# Patient Record
Sex: Female | Born: 1980 | Race: White | Hispanic: No | Marital: Single | Smoking: Never smoker
Health system: Southern US, Community
[De-identification: ages and names within clinical notes are randomized; demographics above are authoritative.]

## PROBLEM LIST (undated history)

## (undated) DIAGNOSIS — F320 Major depressive disorder, single episode, mild: Secondary | ICD-10-CM

## (undated) DIAGNOSIS — K219 Gastro-esophageal reflux disease without esophagitis: Secondary | ICD-10-CM

## (undated) DIAGNOSIS — I10 Essential (primary) hypertension: Secondary | ICD-10-CM

## (undated) DIAGNOSIS — J189 Pneumonia, unspecified organism: Secondary | ICD-10-CM

## (undated) DIAGNOSIS — M1990 Unspecified osteoarthritis, unspecified site: Secondary | ICD-10-CM

## (undated) DIAGNOSIS — M069 Rheumatoid arthritis, unspecified: Secondary | ICD-10-CM

## (undated) DIAGNOSIS — G629 Polyneuropathy, unspecified: Secondary | ICD-10-CM

## (undated) HISTORY — PX: WISDOM TOOTH EXTRACTION: SHX21

## (undated) HISTORY — DX: Polyneuropathy, unspecified: G62.9

## (undated) HISTORY — DX: Unspecified osteoarthritis, unspecified site: M19.90

## (undated) HISTORY — DX: Major depressive disorder, single episode, mild: F32.0

## (undated) HISTORY — DX: Essential (primary) hypertension: I10

---

## 1999-01-12 ENCOUNTER — Other Ambulatory Visit: Admission: RE | Admit: 1999-01-12 | Discharge: 1999-01-12 | Payer: Self-pay | Admitting: Obstetrics and Gynecology

## 1999-02-13 ENCOUNTER — Other Ambulatory Visit: Admission: RE | Admit: 1999-02-13 | Discharge: 1999-02-13 | Payer: Self-pay | Admitting: Obstetrics and Gynecology

## 2000-10-16 ENCOUNTER — Other Ambulatory Visit: Admission: RE | Admit: 2000-10-16 | Discharge: 2000-10-16 | Payer: Self-pay | Admitting: Obstetrics and Gynecology

## 2001-10-06 ENCOUNTER — Other Ambulatory Visit: Admission: RE | Admit: 2001-10-06 | Discharge: 2001-10-06 | Payer: Self-pay | Admitting: Obstetrics and Gynecology

## 2002-11-09 ENCOUNTER — Other Ambulatory Visit: Admission: RE | Admit: 2002-11-09 | Discharge: 2002-11-09 | Payer: Self-pay | Admitting: Obstetrics and Gynecology

## 2003-12-29 ENCOUNTER — Other Ambulatory Visit: Admission: RE | Admit: 2003-12-29 | Discharge: 2003-12-29 | Payer: Self-pay | Admitting: Obstetrics and Gynecology

## 2004-06-14 ENCOUNTER — Other Ambulatory Visit: Admission: RE | Admit: 2004-06-14 | Discharge: 2004-06-14 | Payer: Self-pay | Admitting: Obstetrics and Gynecology

## 2005-04-02 ENCOUNTER — Other Ambulatory Visit: Admission: RE | Admit: 2005-04-02 | Discharge: 2005-04-02 | Payer: Self-pay | Admitting: Obstetrics and Gynecology

## 2010-04-23 ENCOUNTER — Emergency Department: Payer: Self-pay | Admitting: Internal Medicine

## 2010-05-08 ENCOUNTER — Ambulatory Visit: Payer: Self-pay | Admitting: Rheumatology

## 2010-12-19 ENCOUNTER — Encounter: Payer: Self-pay | Admitting: Rheumatology

## 2010-12-31 ENCOUNTER — Encounter: Payer: Self-pay | Admitting: Rheumatology

## 2012-10-23 ENCOUNTER — Emergency Department: Payer: Self-pay | Admitting: Emergency Medicine

## 2012-10-23 LAB — CBC
HCT: 39.2 % (ref 35.0–47.0)
MCH: 32.2 pg (ref 26.0–34.0)
RDW: 12.6 % (ref 11.5–14.5)
WBC: 8.8 10*3/uL (ref 3.6–11.0)

## 2012-10-23 LAB — COMPREHENSIVE METABOLIC PANEL
Albumin: 4 g/dL (ref 3.4–5.0)
Alkaline Phosphatase: 86 U/L (ref 50–136)
Anion Gap: 10 (ref 7–16)
BUN: 14 mg/dL (ref 7–18)
Calcium, Total: 8.8 mg/dL (ref 8.5–10.1)
Co2: 22 mmol/L (ref 21–32)
Glucose: 127 mg/dL — ABNORMAL HIGH (ref 65–99)
SGOT(AST): 71 U/L — ABNORMAL HIGH (ref 15–37)
SGPT (ALT): 99 U/L — ABNORMAL HIGH (ref 12–78)
Total Protein: 7.4 g/dL (ref 6.4–8.2)

## 2013-01-27 ENCOUNTER — Encounter: Payer: Self-pay | Admitting: Family Medicine

## 2013-01-27 ENCOUNTER — Ambulatory Visit (INDEPENDENT_AMBULATORY_CARE_PROVIDER_SITE_OTHER): Payer: Managed Care, Other (non HMO) | Admitting: Family Medicine

## 2013-01-27 VITALS — BP 130/80 | HR 89 | Temp 97.9°F | Ht 65.5 in | Wt 221.2 lb

## 2013-01-27 DIAGNOSIS — Z8742 Personal history of other diseases of the female genital tract: Secondary | ICD-10-CM

## 2013-01-27 DIAGNOSIS — E785 Hyperlipidemia, unspecified: Secondary | ICD-10-CM | POA: Insufficient documentation

## 2013-01-27 DIAGNOSIS — M0230 Reiter's disease, unspecified site: Secondary | ICD-10-CM | POA: Insufficient documentation

## 2013-01-27 DIAGNOSIS — I10 Essential (primary) hypertension: Secondary | ICD-10-CM | POA: Insufficient documentation

## 2013-01-27 DIAGNOSIS — G43009 Migraine without aura, not intractable, without status migrainosus: Secondary | ICD-10-CM | POA: Insufficient documentation

## 2013-01-27 DIAGNOSIS — Z87898 Personal history of other specified conditions: Secondary | ICD-10-CM | POA: Insufficient documentation

## 2013-01-27 HISTORY — DX: Reiter's disease, unspecified site: M02.30

## 2013-01-27 MED ORDER — LOSARTAN POTASSIUM-HCTZ 50-12.5 MG PO TABS: 1.0000 | ORAL_TABLET | Freq: Every day | ORAL | Status: DC

## 2013-01-27 NOTE — Assessment & Plan Note (Signed)
Change to losartan HCTZ. Get UA, EKG and labs from other MDs.  No clear sign of end organ damage.  Risk factor modification discussed. Encouraged exercise, weight loss, healthy eating habits.

## 2013-01-27 NOTE — Patient Instructions (Addendum)
If interested, return for Tetanus booster with nurse visit. Have Dr. [name] send upcoming labs to our office to review. Work on [employer], weight loss and exercise. Change the HCTZ to losartan HCTZ. Use contraception. If not checking kidney function at next Rheum lab test... Return for kidney tests in 7-10 days. Follow BP at home OV. Follow up in 1 month... Bring BP measurements to that OV.

## 2013-01-27 NOTE — Progress Notes (Signed)
  Subjective:    Patient ID: Phyllis Myers, female    DOB: [birth year], 32 y.o.   MRN: [id number]  HPI  32 year old female presents to establish.  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 [name]... On enbrel, methotrexate. Use indocin and hydrocodone prn.   In past few months she has been having headhaches and elevated BP measurements.  Went to ER 11/2011... Heart racing, presyncope..blood pressure was high.  EKG and blood work was nml.  Started on HCTZ at that time. GYN has been following for her but he wants her to establish with a PCP to manage BP.   Also 1 week ago felt ill... 150/110 on 4/19. At home 117-123/93-99 HR 78-105  Last CPE and pap in 08/2012... Labs done at that time were nml.  Looking into IUD.Marland Kitchen stopped OCPs,  given elevated BP.  Has upcoming blood work in next week with Rheum.   Diet: moderate. Exercise: None Review of Systems  Constitutional: Negative for fever, fatigue and unexpected weight change.  HENT: Negative for ear pain, congestion, sore throat, sneezing, trouble swallowing and sinus pressure.   Eyes: Negative for pain and itching.  Respiratory: Negative for cough, shortness of breath and wheezing.   Cardiovascular: Positive for palpitations. Negative for chest pain and leg swelling.  Gastrointestinal: Negative for nausea, abdominal pain, diarrhea, constipation and blood in stool.  Genitourinary: Negative for dysuria, hematuria, vaginal discharge, difficulty urinating and menstrual problem.  Skin: Negative for rash.  Neurological: Negative for syncope, weakness, light-headedness, numbness and headaches.  Psychiatric/Behavioral: Negative for suicidal ideas, confusion and dysphoric mood. The patient is nervous/anxious.        Off and on worried about BP.       Objective:   Physical Exam  Constitutional: Vital signs are normal. She appears well-developed and well-nourished. She is cooperative.  Non-toxic appearance. She does not appear  ill. No distress.  overweight  HENT:  Head: Normocephalic.  Right Ear: Hearing, tympanic membrane, external ear and ear canal normal.  Left Ear: Hearing, tympanic membrane, external ear and ear canal normal.  Nose: Nose normal.  Eyes: Conjunctivae, EOM and lids are normal. Pupils are equal, round, and reactive to light. No foreign bodies found.  Neck: Trachea normal and normal range of motion. Neck supple. Carotid bruit is not present. No mass and no thyromegaly present.  Cardiovascular: Normal rate, regular rhythm, S1 normal, S2 normal, normal heart sounds and intact distal pulses.  Exam reveals no gallop.   No murmur heard. Pulmonary/Chest: Effort normal and breath sounds normal. No respiratory distress. She has no wheezes. She has no rhonchi. She has no rales.  Abdominal: Soft. Normal appearance and bowel sounds are normal. She exhibits no distension, no fluid wave, no abdominal bruit and no mass. There is no hepatosplenomegaly. There is no tenderness. There is no rebound, no guarding and no CVA tenderness. No hernia.  Lymphadenopathy:    She has no cervical adenopathy.    She has no axillary adenopathy.  Neurological: She is alert. She has normal strength. No cranial nerve deficit or sensory deficit.  Skin: Skin is warm, dry and intact. No rash noted.  Psychiatric: Her speech is normal and behavior is normal. Judgment normal. Her mood appears not anxious. Cognition and memory are normal. She does not exhibit a depressed mood.          Assessment & Plan:

## 2013-02-11 ENCOUNTER — Telehealth: Payer: Self-pay

## 2013-02-11 NOTE — Telephone Encounter (Signed)
Pt left v/m; Dr Ermalene Searing started pt on BP med, pt was to request lab results from Dr Lavenia Atlas. Pt wanted to make sure Dr Ermalene Searing received lab results and after review was pt to continue taking BP med.Please advise.

## 2013-02-11 NOTE — Telephone Encounter (Signed)
I did not get labs from Dr. [name]... Can you please request these ( Mainly BMET) so I can assure pt that she can continue new BP med.

## 2013-02-12 NOTE — Telephone Encounter (Signed)
I will not be back in office for a while.. Can you let me know what her creatinine, Na and K were, please?

## 2013-02-12 NOTE — Telephone Encounter (Signed)
Labs requested from [place] and are on your desk for review.

## 2013-02-13 ENCOUNTER — Telehealth: Payer: Self-pay | Admitting: Family Medicine

## 2013-02-13 NOTE — Telephone Encounter (Signed)
Pt f/u to earlier call this week to see if there has been a decision on whether or not to stop her BP meds based on her labs. Please call pt back today per her request.

## 2013-02-13 NOTE — Telephone Encounter (Signed)
Potassium 3.8 Creatine 0.6  Sodium was not checked

## 2013-02-13 NOTE — Telephone Encounter (Signed)
Patient advised Dr. Ermalene Searing is not in the office today and will call her monday

## 2013-02-13 NOTE — Telephone Encounter (Signed)
Let her know that things look good and she can continue on her current  BP med.

## 2013-02-16 NOTE — Telephone Encounter (Signed)
Patient advised.

## 2013-02-24 ENCOUNTER — Encounter: Payer: Self-pay | Admitting: Family Medicine

## 2013-02-24 ENCOUNTER — Ambulatory Visit (INDEPENDENT_AMBULATORY_CARE_PROVIDER_SITE_OTHER): Payer: Managed Care, Other (non HMO) | Admitting: Family Medicine

## 2013-02-24 VITALS — BP 120/84 | HR 72 | Temp 98.0°F | Ht 65.5 in | Wt 228.5 lb

## 2013-02-24 DIAGNOSIS — E785 Hyperlipidemia, unspecified: Secondary | ICD-10-CM

## 2013-02-24 DIAGNOSIS — I10 Essential (primary) hypertension: Secondary | ICD-10-CM

## 2013-02-24 NOTE — Progress Notes (Signed)
  Subjective:    Patient ID: Phyllis Myers, female    DOB: 1980-11-06, 32 y.o.   MRN: [id number]  HPI  Hypertension:  Well controlled on losartan HCTZ. Creatinine stable on 5/5 per [facility] labs Using medication without problems or lightheadedness: None Chest pain with exertion:None Edema:None Short of breath:None, she occasionally feels like she cannot get a good breath. Average home BPs: 117/68-120s/70s. Other issues:  She has been exercising a lot. Diet: working on changes, but poor over [employer] day weekend.  Nonsmoker.  No further headaches or dizziness.   Wt Readings from Last 3 Encounters:  02/24/13 228 lb 8 oz (103.647 kg)  01/27/13 221 lb 4 oz (100.358 kg)      Review of Systems  Constitutional: Negative for fever and fatigue.  HENT: Negative for ear pain.   Eyes: Negative for pain.  Respiratory: Negative for chest tightness and shortness of breath.   Cardiovascular: Negative for chest pain, palpitations and leg swelling.  Gastrointestinal: Negative for abdominal pain.  Genitourinary: Negative for dysuria.       Objective:   Physical Exam  Constitutional: Vital signs are normal. She appears well-developed and well-nourished. She is cooperative.  Non-toxic appearance. She does not appear ill. No distress.  overweight  HENT:  Head: Normocephalic.  Right Ear: Hearing, tympanic membrane, external ear and ear canal normal. Tympanic membrane is not erythematous, not retracted and not bulging.  Left Ear: Hearing, tympanic membrane, external ear and ear canal normal. Tympanic membrane is not erythematous, not retracted and not bulging.  Nose: No mucosal edema or rhinorrhea. Right sinus exhibits no maxillary sinus tenderness and no frontal sinus tenderness. Left sinus exhibits no maxillary sinus tenderness and no frontal sinus tenderness.  Mouth/Throat: Uvula is midline, oropharynx is clear and moist and mucous membranes are normal.  Eyes: Conjunctivae, EOM and lids  are normal. Pupils are equal, round, and reactive to light. No foreign bodies found.  Neck: Trachea normal and normal range of motion. Neck supple. Carotid bruit is not present. No mass and no thyromegaly present.  Cardiovascular: Normal rate, regular rhythm, S1 normal, S2 normal, normal heart sounds, intact distal pulses and normal pulses.  Exam reveals no gallop and no friction rub.   No murmur heard. Pulmonary/Chest: Effort normal and breath sounds normal. Not tachypneic. No respiratory distress. She has no decreased breath sounds. She has no wheezes. She has no rhonchi. She has no rales.  Abdominal: Soft. Normal appearance and bowel sounds are normal. There is no tenderness.  Neurological: She is alert.  Skin: Skin is warm, dry and intact. No rash noted.  Psychiatric: Her speech is normal and behavior is normal. Judgment and thought content normal. Her mood appears not anxious. Cognition and memory are normal. She does not exhibit a depressed mood.          Assessment & Plan:

## 2013-02-24 NOTE — Patient Instructions (Signed)
Follow up in 6 months for HTN. Continue current regimen. Continue work on exercise, weight loss, healthy eating habits.

## 2013-02-24 NOTE — Assessment & Plan Note (Signed)
Well controlled. Continue current medication.  

## 2013-03-10 ENCOUNTER — Ambulatory Visit: Payer: Self-pay | Admitting: Family Medicine

## 2013-06-07 ENCOUNTER — Telehealth: Payer: Self-pay | Admitting: Family Medicine

## 2013-06-07 DIAGNOSIS — I10 Essential (primary) hypertension: Secondary | ICD-10-CM

## 2013-06-07 NOTE — Telephone Encounter (Signed)
Message copied by Excell Seltzer on Sun Jun 07, 2013 11:06 PM ------      Message from: Alvina Chou      Created: Thu Jun 04, 2013 11:00 AM      Regarding: Lab orders for Monday, 9.8.14       Patient is scheduled for CPX labs, please order future labs, Thanks , Terri       ------

## 2013-06-08 ENCOUNTER — Other Ambulatory Visit (INDEPENDENT_AMBULATORY_CARE_PROVIDER_SITE_OTHER): Payer: Managed Care, Other (non HMO)

## 2013-06-08 DIAGNOSIS — I10 Essential (primary) hypertension: Secondary | ICD-10-CM

## 2013-06-08 LAB — COMPREHENSIVE METABOLIC PANEL
ALT: 33 U/L (ref 0–35)
AST: 19 U/L (ref 0–37)
Calcium: 9.1 mg/dL (ref 8.4–10.5)
Chloride: 102 mEq/L (ref 96–112)
Creatinine, Ser: 0.7 mg/dL (ref 0.4–1.2)
Potassium: 3.9 mEq/L (ref 3.5–5.1)
Sodium: 137 mEq/L (ref 135–145)

## 2013-06-08 LAB — LDL CHOLESTEROL, DIRECT: Direct LDL: 150.1 mg/dL

## 2013-06-08 LAB — LIPID PANEL: HDL: 58.1 mg/dL (ref >39.00)

## 2013-06-09 ENCOUNTER — Encounter: Payer: Self-pay | Admitting: Family Medicine

## 2013-06-09 ENCOUNTER — Ambulatory Visit (INDEPENDENT_AMBULATORY_CARE_PROVIDER_SITE_OTHER): Payer: Managed Care, Other (non HMO) | Admitting: Family Medicine

## 2013-06-09 VITALS — BP 90/60 | HR 65 | Temp 98.0°F | Ht 65.25 in | Wt 219.2 lb

## 2013-06-09 DIAGNOSIS — I10 Essential (primary) hypertension: Secondary | ICD-10-CM

## 2013-06-09 DIAGNOSIS — R0789 Other chest pain: Secondary | ICD-10-CM

## 2013-06-09 DIAGNOSIS — Z8249 Family history of ischemic heart disease and other diseases of the circulatory system: Secondary | ICD-10-CM

## 2013-06-09 DIAGNOSIS — E785 Hyperlipidemia, unspecified: Secondary | ICD-10-CM

## 2013-06-09 DIAGNOSIS — M542 Cervicalgia: Secondary | ICD-10-CM

## 2013-06-09 MED ORDER — LOSARTAN POTASSIUM 50 MG PO TABS: 50.0000 mg | ORAL_TABLET | Freq: Every day | ORAL | Status: DC

## 2013-06-09 NOTE — Progress Notes (Signed)
Subjective:    Patient ID: Phyllis Myers, female    DOB: [birth year], 32 y.o.   MRN: [id number]  HPI  32 year old female presents for yearly eval. She sees GYN for CPX and pap/breast exam. Last pap nml ( hx of abnormal 6 months prior) 1 month ago.   Hypertension:  Low normal today on hyzaar Using medication without problems or lightheadedness: None Chest pain with exertion:None ( can exercise and actually feel better).. She does continue to have occ pressure on chest and tightness.. Lasts few seconds.  EKG nml in ER earlier in the year.  Edema:None Short of breath:None, occ feels like with tightness in chest she cannot get a good breath.  Occ racing heart, not really irregular. Average home BPs: 98-110/70-80 She has been increasing exercise, usually daily now.  Family history  Father CAD age 80s. PGF: CAD age40s, MGF: CAD age ? Mother: cardiomyopathy , pulmonary HTN.   BP Readings from Last 3 Encounters:  06/09/13 90/60  02/24/13 120/84  01/27/13 130/80   Wt Readings from Last 3 Encounters:  06/09/13 219 lb 4 oz (99.451 kg)  02/24/13 228 lb 8 oz (103.647 kg)  01/27/13 221 lb 4 oz (100.358 kg)   Elevated Cholesterol: Trig eleated. LDL not at goal <130 given family history and HTN Lab Results  Component Value Date   CHOL 225* 06/08/2013   HDL 58.10 06/08/2013   LDLDIRECT 150.1 06/08/2013   TRIG 212.0* 06/08/2013   CHOLHDL 4 06/08/2013  Diet compliance: Mpderate Exercise:limited Other complaints:  Reactive arthritis stable on enbrel, indomethacin. Followed by Rheum.   Seen at urgent care 1 week ago with left sided neck pain, radicular symptoms... Given course of prednisone ( last dose today) ...had improvement in symptoms, but still some residual. She has started stretching exercises. Flexeril ahs not helped much. X-rays nml.     Review of Systems     Objective:   Physical Exam  Constitutional: Vital signs are normal. She appears well-developed and well-nourished. She  is cooperative.  Non-toxic appearance. She does not appear ill. No distress.  HENT:  Head: Normocephalic.  Right Ear: Hearing, tympanic membrane, external ear and ear canal normal.  Left Ear: Hearing, tympanic membrane, external ear and ear canal normal.  Nose: Nose normal.  Eyes: Conjunctivae, EOM and lids are normal. Pupils are equal, round, and reactive to light. Lids are everted and swept, no foreign bodies found.  Neck: Trachea normal and normal range of motion. Neck supple. Carotid bruit is not present. No mass and no thyromegaly present.  Cardiovascular: Normal rate, regular rhythm, S1 normal, S2 normal, normal heart sounds and intact distal pulses.  Exam reveals no gallop.   No murmur heard. Pulmonary/Chest: Effort normal and breath sounds normal. No respiratory distress. She has no wheezes. She has no rhonchi. She has no rales.  Abdominal: Soft. Normal appearance and bowel sounds are normal. She exhibits no distension, no fluid wave, no abdominal bruit and no mass. There is no hepatosplenomegaly. There is no tenderness. There is no rebound, no guarding and no CVA tenderness. No hernia.  Musculoskeletal:       Cervical back: She exhibits decreased range of motion and tenderness. She exhibits no bony tenderness, no swelling and no edema.  Neg spurling  Lymphadenopathy:    She has no cervical adenopathy.    She has no axillary adenopathy.  Neurological: She is alert. She has normal strength. No cranial nerve deficit or sensory deficit.  Skin: Skin is warm,  dry and intact. No rash noted.  Psychiatric: Her speech is normal and behavior is normal. Judgment normal. Her mood appears not anxious. Cognition and memory are normal. She does not exhibit a depressed mood.          Assessment & Plan:  The patient's preventative maintenance and recommended screening tests for an annual wellness exam were reviewed in full today. Brought up to date unless services declined.  Counselled on the  importance of diet, exercise, and its role in overall health and mortality. The patient's FH and SH was reviewed, including their home life, tobacco status, and drug and alcohol status.   Vaccines: Due for tdap. She refuses flu. She wisehs to hold off on both given upcoming vacation.  Consider PNA vaccine given on enbrel.

## 2013-06-09 NOTE — Assessment & Plan Note (Signed)
Improving control with weight loss. Lower BP med to losartan alone. Encouraged exercise, weight loss, healthy eating habits.

## 2013-06-09 NOTE — Assessment & Plan Note (Signed)
Inadequate control.. May be worsened with recent prednisone and neck issues limiting exercise. Goal LDL ,130.  Work on lifestyle changes. Info given.   Recheck in 3 months.

## 2013-06-09 NOTE — Patient Instructions (Addendum)
Don't forget TDap and flu vaccine. Consider PNA. Now change to ibuprofen 800 mg every 8 hours for pain and inflammation. Can use flexeril prn.  Continue gentle exercise. Heat, massage. Call if not improving in next 2 weeks.. Consider PT referral if not improving. Stop at front to set up cardiac exercise stress test. Change to losartan alone instead of losartan HCTZ combination. Work on low cholesterol, continue exercise and weihgt loss.  Return in  3 motnhsf ro fasting labs for chol check.

## 2013-06-09 NOTE — Assessment & Plan Note (Addendum)
MSK strain. Continue NSAIDs, heat, massage... Increase stretching. Follow up if not improving as expected.

## 2013-06-12 ENCOUNTER — Encounter: Payer: Self-pay | Admitting: Cardiovascular Disease

## 2013-06-12 ENCOUNTER — Ambulatory Visit (INDEPENDENT_AMBULATORY_CARE_PROVIDER_SITE_OTHER): Payer: Managed Care, Other (non HMO) | Admitting: Cardiovascular Disease

## 2013-06-12 ENCOUNTER — Encounter: Payer: Managed Care, Other (non HMO) | Admitting: Family Medicine

## 2013-06-12 DIAGNOSIS — E785 Hyperlipidemia, unspecified: Secondary | ICD-10-CM

## 2013-06-12 DIAGNOSIS — I10 Essential (primary) hypertension: Secondary | ICD-10-CM

## 2013-06-12 DIAGNOSIS — Z8249 Family history of ischemic heart disease and other diseases of the circulatory system: Secondary | ICD-10-CM

## 2013-06-12 DIAGNOSIS — R0789 Other chest pain: Secondary | ICD-10-CM

## 2013-06-12 NOTE — Procedures (Signed)
    Treadmill Stress test  Indication: Atypical chest pain  Baseline Data:  Resting EKG shows NSR with rate of 102 bpm, no significant ST or T wave changes Resting blood pressure of 100/82 mm Hg Stand bruce protocal was used.  Exercise Data:  Patient exercised for 7 min 21 sec,  Peak heart rate of 171 bpm.  This was 91 % of the maximum predicted heart rate. No symptoms of chest pain or lightheadedness were reported at peak stress or in recovery.  Peak Blood pressure recorded was 160/80 Maximal work level: 10.1 METs.  Heart rate at 3 minutes in recovery was 109 bpm. BP response: Normal HR response: Normal  EKG with Exercise: Sinus tachycardia with no significant ST or T wave changes  FINAL IMPRESSION: Normal exercise stress test. No significant EKG changes concerning for ischemia. Good exercise tolerance.

## 2013-06-12 NOTE — Patient Instructions (Addendum)
Your stress test is normal.  Follow up as needed.  

## 2013-06-15 NOTE — Assessment & Plan Note (Addendum)
EKG stable. Given risk factors...  set up cardiac exercise stress test.

## 2013-06-17 ENCOUNTER — Other Ambulatory Visit: Payer: Self-pay

## 2013-06-17 MED ORDER — IBUPROFEN 800 MG PO TABS: 800.0000 mg | ORAL_TABLET | Freq: Three times a day (TID) | ORAL | Status: DC | PRN

## 2013-06-17 NOTE — Telephone Encounter (Signed)
Pt left v/m; pt was seen on 06/09/13 and pt thought Dr Ermalene Searing was going to send in Ibuprofen rx to [facility] in [place]. Rx not there; added to med list from 06/09/13 note.Please advise. Pt request cb when med sent to pharmacy.

## 2013-06-17 NOTE — Telephone Encounter (Signed)
Please let pt know prescription sent in.

## 2013-06-17 NOTE — Telephone Encounter (Signed)
Pt notified rx sent to pharmacy as instructed.

## 2013-08-11 ENCOUNTER — Ambulatory Visit (INDEPENDENT_AMBULATORY_CARE_PROVIDER_SITE_OTHER): Payer: Managed Care, Other (non HMO) | Admitting: Family Medicine

## 2013-08-11 ENCOUNTER — Encounter: Payer: Self-pay | Admitting: Family Medicine

## 2013-08-11 VITALS — BP 110/80 | HR 73 | Temp 97.9°F | Ht 65.25 in | Wt 221.2 lb

## 2013-08-11 DIAGNOSIS — I10 Essential (primary) hypertension: Secondary | ICD-10-CM

## 2013-08-11 DIAGNOSIS — R0789 Other chest pain: Secondary | ICD-10-CM

## 2013-08-11 DIAGNOSIS — Z8249 Family history of ischemic heart disease and other diseases of the circulatory system: Secondary | ICD-10-CM

## 2013-08-11 DIAGNOSIS — E785 Hyperlipidemia, unspecified: Secondary | ICD-10-CM

## 2013-08-11 DIAGNOSIS — R002 Palpitations: Secondary | ICD-10-CM

## 2013-08-11 LAB — CBC WITH DIFFERENTIAL/PLATELET
Basophils Absolute: 0 10*3/uL (ref 0.0–0.1)
Eosinophils Absolute: 0.1 10*3/uL (ref 0.0–0.7)
Hemoglobin: 13.6 g/dL (ref 12.0–15.0)
Lymphocytes Relative: 24.2 % (ref 12.0–46.0)
Lymphs Abs: 1.6 10*3/uL (ref 0.7–4.0)
MCHC: 35.2 g/dL (ref 30.0–36.0)
Monocytes Absolute: 0.4 10*3/uL (ref 0.1–1.0)
Neutro Abs: 4.3 10*3/uL (ref 1.4–7.7)
RDW: 12.7 % (ref 11.5–14.6)

## 2013-08-11 NOTE — Assessment & Plan Note (Signed)
Eval with labs.  on no caffeine.  Mild stress, some mild anxiety. Refer to cardiology frpor consideration of BBlocker or holter monitor.

## 2013-08-11 NOTE — Assessment & Plan Note (Signed)
Stress test low risk

## 2013-08-11 NOTE — Assessment & Plan Note (Signed)
Well controlled on lower dose of med... May need to change to BBlocker.

## 2013-08-11 NOTE — Progress Notes (Signed)
Pre-visit discussion using our clinic review tool. No additional management support is needed unless otherwise documented below in the visit note.  

## 2013-08-11 NOTE — Progress Notes (Signed)
  Subjective:    Patient ID: Phyllis Myers, female    DOB: 01-Feb-1981, 32 y.o.   MRN: [id number]  HPI  32 year old female with HTN presents for follow up.  In 06/2013 she was experiencing chest pain and palpitations. She had stress test that was low risk.  CMET nml.  BP well controlled on losartan alone.  Home measurements: 110/70s   Since then she has had increase in heart racing episodes. Occuring 2-3 times a day sometimes. LAst seconds. No associated SOB, dizziness or chest pain at the same time. Still having occ sharp momentary chest [pain, no exertional at different times.    She has cut all caffeine out.  Recent chol and DM scree at work.     Review of Systems  Constitutional: Negative for fever, fatigue and unexpected weight change.       Always hot, no cold intolerance  HENT: Negative for ear pain.   Eyes: Negative for pain.  Respiratory: Negative for chest tightness and shortness of breath.   Cardiovascular: Negative for chest pain, palpitations and leg swelling.  Gastrointestinal: Negative for abdominal pain.  Genitourinary: Negative for dysuria.  Neurological: Negative for tremors.       Occ feeling jittery  Psychiatric/Behavioral: The patient is not nervous/anxious.        Objective:   Physical Exam  Constitutional: Vital signs are normal. She appears well-developed and well-nourished. She is cooperative.  Non-toxic appearance. She does not appear ill. No distress.  HENT:  Head: Normocephalic.  Right Ear: Hearing, tympanic membrane, external ear and ear canal normal. Tympanic membrane is not erythematous, not retracted and not bulging.  Left Ear: Hearing, tympanic membrane, external ear and ear canal normal. Tympanic membrane is not erythematous, not retracted and not bulging.  Nose: No mucosal edema or rhinorrhea. Right sinus exhibits no maxillary sinus tenderness and no frontal sinus tenderness. Left sinus exhibits no maxillary sinus tenderness and no  frontal sinus tenderness.  Mouth/Throat: Uvula is midline, oropharynx is clear and moist and mucous membranes are normal.  Eyes: Conjunctivae, EOM and lids are normal. Pupils are equal, round, and reactive to light. Lids are everted and swept, no foreign bodies found.  Neck: Trachea normal and normal range of motion. Neck supple. Carotid bruit is not present. No mass and no thyromegaly present.  Cardiovascular: Normal rate, regular rhythm, S1 normal, S2 normal, normal heart sounds, intact distal pulses and normal pulses.  Exam reveals no gallop and no friction rub.   No murmur heard. Pulmonary/Chest: Effort normal and breath sounds normal. Not tachypneic. No respiratory distress. She has no decreased breath sounds. She has no wheezes. She has no rhonchi. She has no rales.  Abdominal: Soft. Normal appearance and bowel sounds are normal. There is no tenderness.  Neurological: She is alert.  Skin: Skin is warm, dry and intact. No rash noted.  Psychiatric: Her speech is normal and behavior is normal. Judgment and thought content normal. Her mood appears not anxious. Cognition and memory are normal. She does not exhibit a depressed mood.          Assessment & Plan:

## 2013-08-11 NOTE — Patient Instructions (Addendum)
Cancel 12/20-14 follow up and blood work here.  Fax labs for work.. This will help determine when to recheck chol or what changes to make. Stopa t lab on way out.  Stop at front desk for cardiology referral.

## 2013-08-13 ENCOUNTER — Ambulatory Visit (INDEPENDENT_AMBULATORY_CARE_PROVIDER_SITE_OTHER): Payer: Managed Care, Other (non HMO) | Admitting: Cardiovascular Disease

## 2013-08-13 ENCOUNTER — Encounter: Payer: Self-pay | Admitting: Cardiovascular Disease

## 2013-08-13 VITALS — BP 118/78 | HR 75 | Ht 66.0 in | Wt 222.8 lb

## 2013-08-13 DIAGNOSIS — R0789 Other chest pain: Secondary | ICD-10-CM

## 2013-08-13 DIAGNOSIS — R0602 Shortness of breath: Secondary | ICD-10-CM

## 2013-08-13 DIAGNOSIS — R002 Palpitations: Secondary | ICD-10-CM

## 2013-08-13 DIAGNOSIS — R079 Chest pain, unspecified: Secondary | ICD-10-CM

## 2013-08-13 NOTE — Assessment & Plan Note (Signed)
Symptoms are overall atypical and seems to be pleuritic. She has no associated dyspnea and thus clinical suspicion for pulmonary embolism is low. I will obtain an echocardiogram to ensure no structural heart disease. Recent treadmill stress test showed no evidence of ischemia. The suspicion for ischemic heart disease is very low.

## 2013-08-13 NOTE — Progress Notes (Signed)
Primary care physician: Dr. Ermalene Searing  HPI  This is a pleasant 32 year old female who was referred for evaluation of chest pain. She has known history of hypertension, reactive arthritis and obesity with no previous cardiac history. She underwent a treadmill stress test in September which showed no evidence of ischemia. She complains of left-sided sharp chest pain lasting for a few seconds. The chest pain is worse with coughing and deep breath. She also has been having frequent palpitations described as skipped beats. There has been no dizziness, syncope or presyncope. She feels better when she exercises and does not get any chest pain with activities. She is not a smoker. There is a family history of heart disease. Her mother died early from cardiomyopathy and pulmonary hypertension. Father had myocardial infarction in his early 81s.  No Known Allergies   Current Outpatient Prescriptions on File Prior to Visit  Medication Sig Dispense Refill  . etanercept (ENBREL) 50 MG/ML injection Inject 50 mg into the skin every 14 (fourteen) days.       Marland Kitchen HYDROcodone-acetaminophen (NORCO/VICODIN) 5-325 MG per tablet Take 1 tablet by mouth every 6 (six) hours as needed for pain.      Marland Kitchen ibuprofen (ADVIL,MOTRIN) 800 MG tablet Take 1 tablet (800 mg total) by mouth every 8 (eight) hours as needed for pain.  30 tablet  0  . indomethacin (INDOCIN SR) 75 MG CR capsule Take 75 mg by mouth as needed.      Marland Kitchen losartan (COZAAR) 50 MG tablet Take 1 tablet (50 mg total) by mouth daily.  30 tablet  3  . zolpidem (AMBIEN) 5 MG tablet Take 5 mg by mouth at bedtime as needed for sleep.       No current facility-administered medications on file prior to visit.     Past Medical History  Diagnosis Date  . Arthritis   . Hypertension      History reviewed. No pertinent past surgical history.   Family History  Problem Relation Age of Onset  . Pulmonary Hypertension Mother   . Cardiomyopathy Mother   . Heart disease  Mother   . Heart disease Father 73    MI  . Heart disease Maternal Grandfather   . Heart disease Paternal Grandfather      History   Social History  . Marital Status: Married    Spouse Name: N/A    Number of Children: N/A  . Years of Education: N/A   Occupational History  . Not on file.   Social History Main Topics  . Smoking status: Never Smoker   . Smokeless tobacco: Never Used  . Alcohol Use: 2.0 oz/week    4 drink(s) per week  . Drug Use: No  . Sexual Activity: Yes    Birth Control/ Protection: Condom   Other Topics Concern  . Not on file   Social History Narrative   Married, no kids.     ROS A 10 point review of system was performed. It is negative other than that mentioned in the history of present illness.   PHYSICAL EXAM   BP 118/78  Pulse 75  Ht 5\' 6"  (1.676 m)  Wt 222 lb 12 oz (101.039 kg)  BMI 35.97 kg/m2  LMP 07/27/2013 Constitutional: She is oriented to person, place, and time. She appears well-developed and well-nourished. No distress.  HENT: No nasal discharge.  Head: Normocephalic and atraumatic.  Eyes: Pupils are equal and round. No discharge.  Neck: Normal range of motion. Neck supple. No JVD  present. No thyromegaly present.  Cardiovascular: Normal rate, regular rhythm, normal heart sounds. Exam reveals no gallop and no friction rub. No murmur heard.  Pulmonary/Chest: Effort normal and breath sounds normal. No stridor. No respiratory distress. She has no wheezes. She has no rales. She exhibits no tenderness.  Abdominal: Soft. Bowel sounds are normal. She exhibits no distension. There is no tenderness. There is no rebound and no guarding.  Musculoskeletal: Normal range of motion. She exhibits no edema and no tenderness.  Neurological: She is alert and oriented to person, place, and time. Coordination normal.  Skin: Skin is warm and dry. No rash noted. She is not diaphoretic. No erythema. No pallor.  Psychiatric: She has a normal mood and  affect. Her behavior is normal. Judgment and thought content normal.     EKG: Normal sinus rhythm with sinus arrhythmia.   ASSESSMENT AND PLAN

## 2013-08-13 NOTE — Assessment & Plan Note (Signed)
This is is likely due to premature beats. She does not consume excessive amounts of caffeine and recent thyroid function was normal. I will request a 48-hour Holter monitor. I suspect that some of her symptoms are triggered by anxiety.

## 2013-08-13 NOTE — Patient Instructions (Signed)
Your physician has requested that you have an echocardiogram. Echocardiography is a painless test that uses sound waves to create images of your heart. It provides your doctor with information about the size and shape of your heart and how well your heart's chambers and valves are working. This procedure takes approximately one hour. There are no restrictions for this procedure.  Your physician has recommended that you wear a holter monitor. Holter monitors are medical devices that record the heart's electrical activity. Doctors most often use these monitors to diagnose arrhythmias. Arrhythmias are problems with the speed or rhythm of the heartbeat. The monitor is a small, portable device. You can wear one while you do your normal daily activities. This is usually used to diagnose what is causing palpitations/syncope (passing out).  Follow up as needed.   

## 2013-08-24 ENCOUNTER — Other Ambulatory Visit (INDEPENDENT_AMBULATORY_CARE_PROVIDER_SITE_OTHER): Payer: Managed Care, Other (non HMO)

## 2013-08-24 ENCOUNTER — Other Ambulatory Visit: Payer: Self-pay

## 2013-08-24 DIAGNOSIS — R0602 Shortness of breath: Secondary | ICD-10-CM

## 2013-08-24 DIAGNOSIS — R079 Chest pain, unspecified: Secondary | ICD-10-CM

## 2013-08-26 ENCOUNTER — Telehealth: Payer: Self-pay

## 2013-08-26 NOTE — Telephone Encounter (Signed)
Pt would like lab results.  

## 2013-08-26 NOTE — Telephone Encounter (Signed)
Pt left v/m pt has been seeing cardiologist and has one more test to be done by cardiologist; Cardiologist said could be anxiety and pt wants to know if it is anxiety will Dr Ermalene Searing prescribe med for or will pt have to get a referral to another specialist.Please advise.

## 2013-08-26 NOTE — Telephone Encounter (Signed)
Spoke w/ pt.  She will wait for Dr. Kirke Corin to interpret ECHO and call with final results.

## 2013-08-31 ENCOUNTER — Telehealth: Payer: Self-pay | Admitting: *Deleted

## 2013-08-31 NOTE — Telephone Encounter (Signed)
Message copied by Fransico Setters on Mon Aug 31, 2013  8:38 AM ------      Message from: Lorine Bears A      Created: Fri Aug 28, 2013 10:34 AM       Inform patient that echo was normal. ------

## 2013-08-31 NOTE — Telephone Encounter (Signed)
Reviewed results with patient. 

## 2013-09-01 ENCOUNTER — Encounter (INDEPENDENT_AMBULATORY_CARE_PROVIDER_SITE_OTHER): Payer: Managed Care, Other (non HMO)

## 2013-09-01 DIAGNOSIS — R002 Palpitations: Secondary | ICD-10-CM

## 2013-09-01 NOTE — Telephone Encounter (Signed)
Left message for patient to call the office and schedule appointment to see Dr. Ermalene Searing.

## 2013-09-01 NOTE — Telephone Encounter (Signed)
She can make an appt with me and we can discuss med to start for anxiety... No need for further specialist at this time.

## 2013-09-03 NOTE — Telephone Encounter (Signed)
Pt called and wanted to know if necessary to come in for another appt to get anxiety med. Advised pt yes. No SI/HI. Pt scheduled appt 09/08/13 at 2:30; if pt condition changes or worsens prior to appt pt will cb.

## 2013-09-04 ENCOUNTER — Ambulatory Visit: Payer: Managed Care, Other (non HMO) | Admitting: Family Medicine

## 2013-09-08 ENCOUNTER — Other Ambulatory Visit: Payer: Managed Care, Other (non HMO)

## 2013-09-08 ENCOUNTER — Ambulatory Visit (INDEPENDENT_AMBULATORY_CARE_PROVIDER_SITE_OTHER): Payer: Managed Care, Other (non HMO) | Admitting: Family Medicine

## 2013-09-08 VITALS — BP 110/80 | HR 79 | Temp 97.7°F | Ht 66.0 in | Wt 226.8 lb

## 2013-09-08 DIAGNOSIS — F411 Generalized anxiety disorder: Secondary | ICD-10-CM | POA: Insufficient documentation

## 2013-09-08 HISTORY — DX: Generalized anxiety disorder: F41.1

## 2013-09-08 MED ORDER — VENLAFAXINE HCL ER 37.5 MG PO CP24: ORAL_CAPSULE | ORAL | Status: DC

## 2013-09-08 MED ORDER — ALPRAZOLAM 0.25 MG PO TABS: 0.2500 mg | ORAL_TABLET | Freq: Two times a day (BID) | ORAL | Status: DC | PRN

## 2013-09-08 NOTE — Assessment & Plan Note (Signed)
At this point .Marland Kitchen Most likely cause of chest tightness and palpitaitons.  Start venlafaxine and use xanax prn.  Work on stress reduction and relaxation.  Follow up in 1 month.

## 2013-09-08 NOTE — Progress Notes (Signed)
Pre-visit discussion using our clinic review tool. No additional management support is needed unless otherwise documented below in the visit note.  

## 2013-09-08 NOTE — Patient Instructions (Addendum)
Start back on exercise and keep up with healthy eating habits. Start venlafaxine daily , increase after 1 week if tolerating. Use alprazolam as needed for anxiety/ chest tightness/ palpitations.  Follow up in 1 month.  Call sooner if problems.

## 2013-09-08 NOTE — Progress Notes (Signed)
   Subjective:    Patient ID: Phyllis Myers, female    DOB: 10-Nov-1980, 32 y.o.   MRN: [id number]  HPI 32 year old female presents after recent negative evaluation for chest tightness and palpitations. Cardiology felt symptoms could be caused by anxiety.  ECHO nml 48 Holter monitor pending results.  Nml tsh and cbc 08/2013  She reports that she feels somewhat anxious through the day.  She has noted she has been more anxious feeling since she started losartan six months ago. Still having some chest tightness, cannot get a good breath at times.    Using ambien at night for sleep and this helps.  No depression.  No SI, no HI.  No personal hx of anxiety or depression.  Recent chol: toto chol 223, tri 134, hdl 50, LDL 146  Last LDL  3 months ago was 150.  Has joined [employer]. Wt Readings from Last 3 Encounters:  09/08/13 226 lb 12 oz (102.853 kg)  08/13/13 222 lb 12 oz (101.039 kg)  08/11/13 221 lb 4 oz (100.358 kg)     Review of Systems  Constitutional: Negative for fever and fatigue.  HENT: Negative for ear pain.   Eyes: Negative for pain.  Respiratory: Negative for chest tightness and shortness of breath.   Cardiovascular: Positive for chest pain and palpitations. Negative for leg swelling.  Gastrointestinal: Negative for abdominal pain.  Genitourinary: Negative for dysuria.      Objective:   Physical Exam  Constitutional: Vital signs are normal. She appears well-developed and well-nourished. She is cooperative.  Non-toxic appearance. She does not appear ill. No distress.  HENT:  Head: Normocephalic.  Right Ear: Hearing, tympanic membrane, external ear and ear canal normal. Tympanic membrane is not erythematous, not retracted and not bulging.  Left Ear: Hearing, tympanic membrane, external ear and ear canal normal. Tympanic membrane is not erythematous, not retracted and not bulging.  Nose: No mucosal edema or rhinorrhea. Right sinus exhibits no maxillary  sinus tenderness and no frontal sinus tenderness. Left sinus exhibits no maxillary sinus tenderness and no frontal sinus tenderness.  Mouth/Throat: Uvula is midline, oropharynx is clear and moist and mucous membranes are normal.  Eyes: Conjunctivae, EOM and lids are normal. Pupils are equal, round, and reactive to light. Lids are everted and swept, no foreign bodies found.  Neck: Trachea normal and normal range of motion. Neck supple. Carotid bruit is not present. No mass and no thyromegaly present.  Cardiovascular: Normal rate, regular rhythm, S1 normal, S2 normal, normal heart sounds, intact distal pulses and normal pulses.  Exam reveals no gallop and no friction rub.   No murmur heard. Pulmonary/Chest: Effort normal and breath sounds normal. Not tachypneic. No respiratory distress. She has no decreased breath sounds. She has no wheezes. She has no rhonchi. She has no rales.  Abdominal: Soft. Normal appearance and bowel sounds are normal. There is no tenderness.  Neurological: She is alert.  Skin: Skin is warm, dry and intact. No rash noted.  Psychiatric: Her speech is normal. Judgment and thought content normal. Her mood appears not anxious. Her affect is blunt. She is withdrawn. Cognition and memory are normal. She does not exhibit a depressed mood.          Assessment & Plan:

## 2013-09-14 ENCOUNTER — Telehealth: Payer: Self-pay | Admitting: *Deleted

## 2013-09-14 NOTE — Telephone Encounter (Signed)
Reviewed holter results with patient. Patient verbalized understanding.

## 2013-09-15 ENCOUNTER — Telehealth: Payer: Self-pay

## 2013-09-15 NOTE — Telephone Encounter (Signed)
Pt left v/m; pt was seen by Dr Saverio Danker, rheumotologist; Vit B12  Was tested and results were low; Dr [place] office is to send results to Dr Ermalene Searing and pt wants to know if she needs appt to see Dr Ermalene Searing or does pt need to schedule B 12 shots.Please advise.

## 2013-09-15 NOTE — Telephone Encounter (Signed)
Left message for Phyllis Myers to call the appointment line and just schedule nurse visit for B12 injection.  B12 labs requested from Dr. Saverio Danker.

## 2013-09-15 NOTE — Telephone Encounter (Signed)
Does not need appt with me.. Schedule monthly B12 injections.  Get copy of B12 lab from Dr. [name].

## 2013-09-29 ENCOUNTER — Ambulatory Visit (INDEPENDENT_AMBULATORY_CARE_PROVIDER_SITE_OTHER): Payer: Managed Care, Other (non HMO) | Admitting: *Deleted

## 2013-09-29 DIAGNOSIS — E538 Deficiency of other specified B group vitamins: Secondary | ICD-10-CM

## 2013-09-29 MED ORDER — CYANOCOBALAMIN 1000 MCG/ML IJ SOLN
1000.0000 ug | Freq: Once | INTRAMUSCULAR | Status: AC
  Administered 2013-09-29: 1000 ug via INTRAMUSCULAR

## 2013-09-30 ENCOUNTER — Encounter: Payer: Self-pay | Admitting: Family Medicine

## 2013-09-30 ENCOUNTER — Ambulatory Visit (INDEPENDENT_AMBULATORY_CARE_PROVIDER_SITE_OTHER): Payer: Managed Care, Other (non HMO) | Admitting: Family Medicine

## 2013-09-30 VITALS — BP 110/84 | HR 80 | Temp 98.0°F | Wt 228.0 lb

## 2013-09-30 DIAGNOSIS — I10 Essential (primary) hypertension: Secondary | ICD-10-CM

## 2013-09-30 DIAGNOSIS — M0230 Reiter's disease, unspecified site: Secondary | ICD-10-CM

## 2013-09-30 DIAGNOSIS — R1900 Intra-abdominal and pelvic swelling, mass and lump, unspecified site: Secondary | ICD-10-CM | POA: Insufficient documentation

## 2013-09-30 DIAGNOSIS — F411 Generalized anxiety disorder: Secondary | ICD-10-CM

## 2013-09-30 NOTE — Progress Notes (Signed)
   Subjective:    Patient ID: Phyllis Myers, female    DOB: 1981-02-09, 32 y.o.   MRN: [id number]  HPI  32 year old female pt with history of reactive arthritis, HTN and anxiety presents with new onset  Lump on left side of abdomen. Noted  over last week.  Nontender.   She has had some diarrhea for a day , no constipation. No dysuria. No recent cold symptoms, occ scratchy throat. No rash. No recent falls or injuries. No MVA. No SOB, no CP. No cough.  Generalized anxiety, moderately: stopped effexor as BP was increasing. Using alprazolam 2-3 times a week.  Review of Systems  Constitutional: Negative for fever and fatigue.  HENT: Negative for ear pain.   Eyes: Negative for pain.  Respiratory: Negative for chest tightness and shortness of breath.   Cardiovascular: Negative for chest pain, palpitations and leg swelling.  Gastrointestinal: Negative for abdominal pain.  Genitourinary: Negative for dysuria.       Objective:   Physical Exam  Constitutional: Vital signs are normal. She appears well-developed and well-nourished. She is cooperative.  Non-toxic appearance. She does not appear ill. No distress.  HENT:  Head: Normocephalic.  Right Ear: Hearing, tympanic membrane, external ear and ear canal normal. Tympanic membrane is not erythematous, not retracted and not bulging.  Left Ear: Hearing, tympanic membrane, external ear and ear canal normal. Tympanic membrane is not erythematous, not retracted and not bulging.  Nose: No mucosal edema or rhinorrhea. Right sinus exhibits no maxillary sinus tenderness and no frontal sinus tenderness. Left sinus exhibits no maxillary sinus tenderness and no frontal sinus tenderness.  Mouth/Throat: Uvula is midline, oropharynx is clear and moist and mucous membranes are normal.  Eyes: Conjunctivae, EOM and lids are normal. Pupils are equal, round, and reactive to light. Lids are everted and swept, no foreign bodies found.  Neck: Trachea normal  and normal range of motion. Neck supple. Carotid bruit is not present. No mass and no thyromegaly present.  Cardiovascular: Normal rate, regular rhythm, S1 normal, S2 normal, normal heart sounds, intact distal pulses and normal pulses.  Exam reveals no gallop and no friction rub.   No murmur heard. Pulmonary/Chest: Effort normal and breath sounds normal. Not tachypneic. No respiratory distress. She has no decreased breath sounds. She has no wheezes. She has no rhonchi. She has no rales.  Abdominal: Soft. Normal appearance and bowel sounds are normal. There is no hepatosplenomegaly. There is no tenderness. There is no rebound and no CVA tenderness. No hernia. Hernia confirmed negative in the ventral area.  Slight prominence in left abdomen.  No past surgeries.  Neurological: She is alert.  Skin: Skin is warm, dry and intact. No rash noted.  Psychiatric: Her speech is normal and behavior is normal. Judgment and thought content normal. Her mood appears not anxious. Cognition and memory are normal. She does not exhibit a depressed mood.          Assessment & Plan:

## 2013-09-30 NOTE — Progress Notes (Signed)
Pre-visit discussion using our clinic review tool. No additional management support is needed unless otherwise documented below in the visit note.  

## 2013-09-30 NOTE — Assessment & Plan Note (Signed)
Now off enbrel, methotrexate,  indomethacin and hydrocodone . No current flare.

## 2013-09-30 NOTE — Patient Instructions (Addendum)
Follow the area of concern.. If increasing in size, pain starts.. Call.  Follow BP at home. Work on exercise, weight loss, healthy eating habits.  Call if mood deteriorating.

## 2013-09-30 NOTE — Assessment & Plan Note (Signed)
Well controlled. Continue current medication.  

## 2013-09-30 NOTE — Assessment & Plan Note (Signed)
Moderate control off effexor.. Using alprazolam in limited fashion. She will let me know if mood worsening and increase oin alprazolam use because we will consider other SSRI med.

## 2013-09-30 NOTE — Assessment & Plan Note (Signed)
Only slight prominence noted on exam, no splenomegaly. Pt assymptomatic.  Like lipoma or soft tissue change.. Exam difficult due to central obesity. If changing.. eval further with [place] abd.

## 2013-10-15 ENCOUNTER — Ambulatory Visit: Payer: Managed Care, Other (non HMO) | Admitting: Family Medicine

## 2013-11-03 ENCOUNTER — Ambulatory Visit: Payer: Managed Care, Other (non HMO)

## 2013-11-03 ENCOUNTER — Other Ambulatory Visit: Payer: Self-pay | Admitting: Family Medicine

## 2013-11-06 ENCOUNTER — Other Ambulatory Visit: Payer: Self-pay | Admitting: Family Medicine

## 2013-11-06 NOTE — Telephone Encounter (Signed)
Last office visit 12.31.2014.  Last filled 09/08/2013.  Ok to refill?

## 2013-11-06 NOTE — Telephone Encounter (Signed)
Called to [facility] [place]. 

## 2013-11-09 ENCOUNTER — Ambulatory Visit (INDEPENDENT_AMBULATORY_CARE_PROVIDER_SITE_OTHER): Payer: Managed Care, Other (non HMO) | Admitting: Family Medicine

## 2013-11-09 ENCOUNTER — Encounter: Payer: Self-pay | Admitting: Family Medicine

## 2013-11-09 VITALS — BP 148/108 | HR 103 | Temp 98.3°F | Ht 66.0 in | Wt 232.5 lb

## 2013-11-09 DIAGNOSIS — I10 Essential (primary) hypertension: Secondary | ICD-10-CM

## 2013-11-09 MED ORDER — GUAIFENESIN-CODEINE 100-10 MG/5ML PO SYRP: 5.0000 mL | ORAL_SOLUTION | Freq: Every evening | ORAL | Status: DC | PRN

## 2013-11-09 MED ORDER — AZITHROMYCIN 250 MG PO TABS: ORAL_TABLET | ORAL | Status: DC

## 2013-11-09 NOTE — Progress Notes (Signed)
   Subjective:    Patient ID: Phyllis Myers, female    DOB: 03-28-1981, 33 y.o.   MRN: [id number]  Cough This is a new problem. The current episode started 1 to 4 weeks ago (> 10 days). The problem has been gradually worsening. The problem occurs constantly. The cough is productive of sputum. Associated symptoms include chills, a fever, headaches, nasal congestion, rhinorrhea and a sore throat. Pertinent negatives include no ear congestion, ear pain, myalgias, postnasal drip, rash, shortness of breath or wheezing. Associated symptoms comments: Temp 100.3  3 days ago No sinus pressure or tenderness Fatigue, no myalgia. Risk factors: non smoker. Treatments tried: ibuprofen, OTC cough med. The treatment provided mild relief. Her past medical history is significant for environmental allergies. There is no history of asthma, bronchiectasis, bronchitis, COPD, emphysema or pneumonia.      Review of Systems  Constitutional: Positive for fever and chills.  HENT: Positive for rhinorrhea and sore throat. Negative for ear pain and postnasal drip.   Respiratory: Positive for cough. Negative for shortness of breath and wheezing.   Musculoskeletal: Negative for myalgias.  Skin: Negative for rash.  Allergic/Immunologic: Positive for environmental allergies.  Neurological: Positive for headaches.       Objective:   Physical Exam  Constitutional: Vital signs are normal. She appears well-developed and well-nourished. She is cooperative.  Non-toxic appearance. She does not appear ill. No distress.  HENT:  Head: Normocephalic.  Right Ear: Hearing, external ear and ear canal normal. Tympanic membrane is not erythematous, not retracted and not bulging. A middle ear effusion is present.  Left Ear: Hearing, external ear and ear canal normal. Tympanic membrane is not erythematous, not retracted and not bulging. A middle ear effusion is present.  Nose: Mucosal edema and rhinorrhea present. Right sinus exhibits  no maxillary sinus tenderness and no frontal sinus tenderness. Left sinus exhibits no maxillary sinus tenderness and no frontal sinus tenderness.  Mouth/Throat: Uvula is midline and mucous membranes are normal. Posterior oropharyngeal erythema present.  Eyes: Conjunctivae, EOM and lids are normal. Pupils are equal, round, and reactive to light. Lids are everted and swept, no foreign bodies found.  Neck: Trachea normal and normal range of motion. Neck supple. Carotid bruit is not present. No mass and no thyromegaly present.  Cardiovascular: Normal rate, regular rhythm, S1 normal, S2 normal, normal heart sounds, intact distal pulses and normal pulses.  Exam reveals no gallop and no friction rub.   No murmur heard. Pulmonary/Chest: Effort normal and breath sounds normal. Not tachypneic. No respiratory distress. She has no decreased breath sounds. She has no wheezes. She has no rhonchi. She has no rales.  Neurological: She is alert.  Skin: Skin is warm, dry and intact. No rash noted.  Psychiatric: Her speech is normal and behavior is normal. Judgment normal. Her mood appears not anxious. Cognition and memory are normal. She does not exhibit a depressed mood.          Assessment & Plan:

## 2013-11-09 NOTE — Progress Notes (Signed)
Pre-visit discussion using our clinic review tool. No additional management support is needed unless otherwise documented below in the visit note.  

## 2013-11-09 NOTE — Assessment & Plan Note (Signed)
Poor control today... Did not take BP med until in room. Follow at home.

## 2013-11-09 NOTE — Patient Instructions (Addendum)
Call if BP is persistently ( > 3 measurements)  > 140/90. Start mucinex DM ( not decongestant) daily, and cough suppressant rx at night. Complete antibiotics.  Expect 5-7 more days of illness, call sooner if shortness of breath, go to ER severe shortness of breath.

## 2013-11-10 ENCOUNTER — Telehealth: Payer: Self-pay | Admitting: Family Medicine

## 2013-11-10 NOTE — Telephone Encounter (Signed)
Relevant patient education assigned to patient using Emmi. ° °

## 2013-11-18 ENCOUNTER — Ambulatory Visit (INDEPENDENT_AMBULATORY_CARE_PROVIDER_SITE_OTHER): Payer: Managed Care, Other (non HMO)

## 2013-11-18 DIAGNOSIS — E538 Deficiency of other specified B group vitamins: Secondary | ICD-10-CM

## 2013-11-18 MED ORDER — CYANOCOBALAMIN 1000 MCG/ML IJ SOLN
1000.0000 ug | Freq: Once | INTRAMUSCULAR | Status: AC
  Administered 2013-11-18: 1000 ug via INTRAMUSCULAR

## 2013-11-20 ENCOUNTER — Telehealth: Payer: Self-pay

## 2013-11-20 MED ORDER — GUAIFENESIN-CODEINE 100-10 MG/5ML PO SYRP: 5.0000 mL | ORAL_SOLUTION | Freq: Every evening | ORAL | Status: DC | PRN

## 2013-11-20 NOTE — Telephone Encounter (Signed)
Cough can persist for sometime after infeciton resolved, so as long as she is feeling  Significantly better, okay to  Refill cough med. Avoid decongestants and continue to follow BP. Call if remaining > 140/90 consistently after  She is better.

## 2013-11-20 NOTE — Telephone Encounter (Signed)
Pt left v/m; pt seen 11/09/13; pt finished z pak and pt feels better but still has lingering cough;pt wants to know if cough med could be called to [facility]. Pt is not sure if BP is elevated at times due to pt coughing so much.  BP is averaging 120 -140 / 89-94.pt request cb.

## 2013-11-20 NOTE — Telephone Encounter (Signed)
Patient notified as instructed by telephone.  She states she is feeling better except for the cough.  Robitussin AC called to [facility].

## 2013-12-16 ENCOUNTER — Ambulatory Visit: Payer: Managed Care, Other (non HMO)

## 2013-12-28 ENCOUNTER — Telehealth: Payer: Self-pay

## 2013-12-28 NOTE — Telephone Encounter (Signed)
Noted  

## 2013-12-28 NOTE — Telephone Encounter (Signed)
Pt left v/m; pts husband died 01/24/14; pt request med to help calm her nerves without her having to come in for appt. Alprazolam really not helping her nervousness now due to pt being so upset. Pt request different med for nerves to [facility] in [place].Please advise.

## 2013-12-28 NOTE — Telephone Encounter (Signed)
D/c xanax  For now.  Valium 5 mg, 1 po tid prn anxiety. #30, 0 refills  Cc: Dr. Leonard Schwartz

## 2013-12-29 MED ORDER — DIAZEPAM 5 MG PO TABS: 5.0000 mg | ORAL_TABLET | Freq: Three times a day (TID) | ORAL | Status: DC | PRN

## 2013-12-29 NOTE — Telephone Encounter (Signed)
Rx faxed to [facility] in [place] at [phone].  Left message for patient to return my call.

## 2013-12-30 NOTE — Telephone Encounter (Signed)
Left message that prescription has been sent to her pharmacy.

## 2014-03-31 NOTE — Telephone Encounter (Signed)
This encounter was created in error - please disregard.

## 2014-04-26 ENCOUNTER — Other Ambulatory Visit: Payer: Self-pay | Admitting: Family Medicine

## 2014-04-26 NOTE — Telephone Encounter (Signed)
Called to [facility] in [place]. 

## 2014-04-26 NOTE — Telephone Encounter (Signed)
Last office visit 11/09/2013.  Last refilled 12/29/2013 for #30 with no refills.  Ok to refill?

## 2014-04-26 NOTE — Telephone Encounter (Signed)
Ok to refill 30, 0 ref 

## 2014-06-17 ENCOUNTER — Encounter (HOSPITAL_COMMUNITY): Payer: Self-pay | Admitting: Emergency Medicine

## 2014-06-17 ENCOUNTER — Emergency Department (HOSPITAL_COMMUNITY)
Admission: EM | Admit: 2014-06-17 | Discharge: 2014-06-17 | Disposition: A | Discharge status: Home / Self Care | Payer: Managed Care, Other (non HMO) | Attending: Emergency Medicine | Admitting: Emergency Medicine

## 2014-06-17 ENCOUNTER — Telehealth: Payer: Self-pay | Admitting: Family Medicine

## 2014-06-17 DIAGNOSIS — R42 Dizziness and giddiness: Secondary | ICD-10-CM | POA: Diagnosis not present

## 2014-06-17 DIAGNOSIS — Z8739 Personal history of other diseases of the musculoskeletal system and connective tissue: Secondary | ICD-10-CM | POA: Insufficient documentation

## 2014-06-17 DIAGNOSIS — I10 Essential (primary) hypertension: Secondary | ICD-10-CM | POA: Diagnosis not present

## 2014-06-17 DIAGNOSIS — Z79899 Other long term (current) drug therapy: Secondary | ICD-10-CM | POA: Insufficient documentation

## 2014-06-17 DIAGNOSIS — Z792 Long term (current) use of antibiotics: Secondary | ICD-10-CM | POA: Insufficient documentation

## 2014-06-17 DIAGNOSIS — R110 Nausea: Secondary | ICD-10-CM | POA: Diagnosis not present

## 2014-06-17 DIAGNOSIS — F419 Anxiety disorder, unspecified: Secondary | ICD-10-CM

## 2014-06-17 DIAGNOSIS — F411 Generalized anxiety disorder: Secondary | ICD-10-CM | POA: Diagnosis not present

## 2014-06-17 LAB — CBC
HCT: 44.8 % (ref 36.0–46.0)
Hemoglobin: 16 g/dL — ABNORMAL HIGH (ref 12.0–15.0)
MCH: 35.1 pg — AB (ref 26.0–34.0)
MCHC: 35.7 g/dL (ref 30.0–36.0)
MCV: 98.2 fL (ref 78.0–100.0)
PLATELETS: 182 10*3/uL (ref 150–400)
RBC: 4.56 MIL/uL (ref 3.87–5.11)
RDW: 12.3 % (ref 11.5–15.5)
WBC: 10.8 10*3/uL — ABNORMAL HIGH (ref 4.0–10.5)

## 2014-06-17 LAB — BASIC METABOLIC PANEL
Anion gap: 16 — ABNORMAL HIGH (ref 5–15)
BUN: 12 mg/dL (ref 6–23)
CO2: 26 mEq/L (ref 19–32)
CREATININE: 0.6 mg/dL (ref 0.50–1.10)
Calcium: 9.7 mg/dL (ref 8.4–10.5)
Chloride: 97 mEq/L (ref 96–112)
GFR calc non Af Amer: >90 mL/min (ref >90)
Glucose, Bld: 104 mg/dL — ABNORMAL HIGH (ref 70–99)
Potassium: 3.8 mEq/L (ref 3.7–5.3)
Sodium: 139 mEq/L (ref 137–147)

## 2014-06-17 NOTE — ED Notes (Signed)
Pt reports lightheadedness and "swimmy" headed since AM. Pt also reports that she was walking and became so dizzy that she nearly passed out. Pt is AO x4, noted to be anxious. Denies blurred vision at this time. Denies dizziness at this time. States she has been anxious since her husbands death. Pt in NAD. Neuro intact. VSS.

## 2014-06-17 NOTE — ED Provider Notes (Signed)
TIME SEEN: 7:40 PM  CHIEF COMPLAINT: Lightheadedness, nausea, blurry vision  HPI: Patient is a 33 year old female with history of hypertension, anxiety who presents to the emergency department with an episode of lightheadedness, blurry vision, nausea while at work today while standing. She reports that she called her doctor who instructed she needed to come to urgent care or the emergency department. She states in the waiting room she began feeling better. She has had intermittent chest pain before but none today. She states she was seen by her cardiologist and has had a negative workup including echocardiogram. She denies any chest pain today. No shortness of breath. No recent vomiting or diarrhea. No bloody stool or melena. No numbness, tingling or focal weakness. States repletion is back to normal. No headache, head injury. Patient reports she has been under a lot of stress recently as her husband recently passed away several months ago.  ROS: See HPI Constitutional: no fever  Eyes: no drainage  ENT: no runny nose   Cardiovascular:  no chest pain  Resp: no SOB  GI: no vomiting GU: no dysuria Integumentary: no rash  Allergy: no hives  Musculoskeletal: no leg swelling  Neurological: no slurred speech ROS otherwise negative  PAST MEDICAL HISTORY/PAST SURGICAL HISTORY:  Past Medical History  Diagnosis Date  . Arthritis   . Hypertension     MEDICATIONS:  Prior to Admission medications   Medication Sig Start Date End Date Taking? Authorizing Provider  azithromycin (ZITHROMAX) 250 MG tablet 2 tab po x 1 day then 1 tab po daily 11/09/13   Amy E Bedsole, MD  diazepam (VALIUM) 5 MG tablet TAKE 1 TABLET BY MOUTH THREE TIMES DAILY AS NEEDED FOR ANXIETY 04/26/14   Amy E Ermalene Searing, MD  guaiFENesin-codeine (ROBITUSSIN AC) 100-10 MG/5ML syrup Take 5-10 mLs by mouth at bedtime as needed for cough. 11/20/13   Amy Michelle Nasuti, MD  losartan (COZAAR) 50 MG tablet TAKE 1 TABLET BY MOUTH EVERY DAY 04/26/14   Amy  Michelle Nasuti, MD  zolpidem (AMBIEN) 5 MG tablet Take 5 mg by mouth at bedtime as needed for sleep.    Historical Provider, MD    ALLERGIES:  No Known Allergies  SOCIAL HISTORY:  History  Substance Use Topics  . Smoking status: Never Smoker   . Smokeless tobacco: Never Used  . Alcohol Use: 2.0 oz/week    4 drink(s) per week    FAMILY HISTORY: Family History  Problem Relation Age of Onset  . Pulmonary Hypertension Mother   . Cardiomyopathy Mother   . Heart disease Mother   . Heart disease Father 38    MI  . Heart disease Maternal Grandfather   . Heart disease Paternal Grandfather     EXAM: BP 137/88  Pulse 77  Temp(Src) 98.5 F (36.9 C) (Oral)  Resp 20  SpO2 100%  LMP 05/27/2014 CONSTITUTIONAL: Alert and oriented and responds appropriately to questions. Well-appearing; well-nourished HEAD: Normocephalic EYES: Conjunctivae clear, PERRL ENT: normal nose; no rhinorrhea; moist mucous membranes; pharynx without lesions noted NECK: Supple, no meningismus, no LAD  CARD: RRR; S1 and S2 appreciated; no murmurs, no clicks, no rubs, no gallops RESP: Normal chest excursion without splinting or tachypnea; breath sounds clear and equal bilaterally; no wheezes, no rhonchi, no rales,  ABD/GI: Normal bowel sounds; non-distended; soft, non-tender, no rebound, no guarding BACK:  The back appears normal and is non-tender to palpation, there is no CVA tenderness EXT: Normal ROM in all joints; non-tender to palpation; no edema; normal  capillary refill; no cyanosis    SKIN: Normal color for age and race; warm NEURO: Moves all extremities equally, sensation to light touch intact diffusely, cranial nerves 2 through contact PSYCH: The patient's mood and manner are appropriate. Grooming and personal hygiene are appropriate.  MEDICAL DECISION MAKING: Patient here with lightheadedness, nausea and feeling like she may pass out early this morning. She does have hypertension and a family history of  coronary artery disease and has had negative cardiac workup with cardiology. Patient's labs are unremarkable other than a mild leukocytosis. EKG shows no ischemic changes or arrhythmia. She reports that there is no possible chance she could be pregnant given her husband recently passed away and she has not been sexually active with anyone else. She is having menstrual periods. She states she's feeling much better and ready for discharge home. She has a PCP for outpatient followup. I feel this may have been anxiety related and she agrees. Discussed strict return precautions and supportive care instructions. She verbalized understanding and is comfortable with plan.       Layla Maw Reiner Loewen, DO 06/17/14 2009

## 2014-06-17 NOTE — Telephone Encounter (Signed)
Patient Information:  Caller Name: Phyllis Myers  Phone: [phone]  Patient: Phyllis Myers  Gender: Female  DOB: 25-Oct-1980  Age: 33 Years  PCP: Kerby Nora (Family Practice)  Pregnant: No  Office Follow Up:  Does the office need to follow up with this patient?: No  Instructions For The Office: N/A  RN Note:  No appts. in the office. Pt. will have her driver take her to the [facility] on [facility]. now.  Symptoms  Reason For Call & Symptoms: Calling for appt. Feels dizzy and like she is going to pass out. Drinking fine. States the room has at times been spinning around. Pt. has HTN. Has lost a lot of weight (around 30 lbs.). Worried that it is too low. Pt. has been under a lot of stress. Just lost her husband recently.  Reviewed Health History In EMR: Yes  Reviewed Medications In EMR: Yes  Reviewed Allergies In EMR: Yes  Reviewed Surgeries / Procedures: Yes  Date of Onset of Symptoms: 06/17/2014 OB / GYN:  LMP: 05/27/2014  Guideline(s) Used:  Dizziness  Disposition Per Guideline:   Go to ED Now (or to Office with PCP Approval)  Reason For Disposition Reached:   Severe dizziness (e.g., unable to stand, requires support to walk, feels like passing out now)  Advice Given:  Call Back If:  Passes out (faints)  You become worse.  Patient Will Follow Care Advice:  YES

## 2014-06-17 NOTE — Discharge Instructions (Signed)
Panic Attacks °Panic attacks are sudden, short-lived surges of severe anxiety, fear, or discomfort. They may occur for no reason when you are relaxed, when you are anxious, or when you are sleeping. Panic attacks may occur for a number of reasons:  °· Healthy people occasionally have panic attacks in extreme, life-threatening situations, such as war or natural disasters. Normal anxiety is a protective mechanism of the body that helps us react to danger (fight or flight response). °· Panic attacks are often seen with anxiety disorders, such as panic disorder, social anxiety disorder, generalized anxiety disorder, and phobias. Anxiety disorders cause excessive or uncontrollable anxiety. They may interfere with your relationships or other life activities. °· Panic attacks are sometimes seen with other mental illnesses, such as depression and posttraumatic stress disorder. °· Certain medical conditions, prescription medicines, and drugs of abuse can cause panic attacks. °SYMPTOMS  °Panic attacks start suddenly, peak within 20 minutes, and are accompanied by four or more of the following symptoms: °· Pounding heart or fast heart rate (palpitations). °· Sweating. °· Trembling or shaking. °· Shortness of breath or feeling smothered. °· Feeling choked. °· Chest pain or discomfort. °· Nausea or strange feeling in your stomach. °· Dizziness, light-headedness, or feeling like you will faint. °· Chills or hot flushes. °· Numbness or tingling in your lips or hands and feet. °· Feeling that things are not real or feeling that you are not yourself. °· Fear of losing control or going crazy. °· Fear of dying. °Some of these symptoms can mimic serious medical conditions. For example, you may think you are having a heart attack. Although panic attacks can be very scary, they are not life threatening. °DIAGNOSIS  °Panic attacks are diagnosed through an assessment by your health care provider. Your health care provider will ask  questions about your symptoms, such as where and when they occurred. Your health care provider will also ask about your medical history and use of alcohol and drugs, including prescription medicines. Your health care provider may order blood tests or other studies to rule out a serious medical condition. Your health care provider may refer you to a mental health professional for further evaluation. °TREATMENT  °· Most healthy people who have one or two panic attacks in an extreme, life-threatening situation will not require treatment. °· The treatment for panic attacks associated with anxiety disorders or other mental illness typically involves counseling with a mental health professional, medicine, or a combination of both. Your health care provider will help determine what treatment is best for you. °· Panic attacks due to physical illness usually go away with treatment of the illness. If prescription medicine is causing panic attacks, talk with your health care provider about stopping the medicine, decreasing the dose, or substituting another medicine. °· Panic attacks due to alcohol or drug abuse go away with abstinence. Some adults need professional help in order to stop drinking or using drugs. °HOME CARE INSTRUCTIONS  °· Take all medicines as directed by your health care provider.   °· Schedule and attend follow-up visits as directed by your health care provider. It is important to keep all your appointments. °SEEK MEDICAL CARE IF: °· You are not able to take your medicines as prescribed. °· Your symptoms do not improve or get worse. °SEEK IMMEDIATE MEDICAL CARE IF:  °· You experience panic attack symptoms that are different than your usual symptoms. °· You have serious thoughts about hurting yourself or others. °· You are taking medicine for panic attacks and   have a serious side effect. MAKE SURE YOU:  Understand these instructions.  Will watch your condition.  Will get help right away if you are not  doing well or get worse. Document Released: 09/17/2005 Document Revised: 09/22/2013 Document Reviewed: 05/01/2013 [facility] Patient Information 2015 [place], [place]. This information is not intended to replace advice given to you by your health care provider. Make sure you discuss any questions you have with your health care provider.    Dizziness Dizziness is a common problem. It is a feeling of unsteadiness or light-headedness. You may feel like you are about to faint. Dizziness can lead to injury if you stumble or fall. A person of any age group can suffer from dizziness, but dizziness is more common in older adults. CAUSES  Dizziness can be caused by many different things, including:  Middle ear problems.  Standing for too long.  Infections.  An allergic reaction.  Aging.  An emotional response to something, such as the sight of blood.  Side effects of medicines.  Tiredness.  Problems with circulation or blood pressure.  Excessive use of alcohol or medicines, or illegal drug use.  Breathing too fast (hyperventilation).  An irregular heart rhythm (arrhythmia).  A low red blood cell count (anemia).  Pregnancy.  Vomiting, diarrhea, fever, or other illnesses that cause body fluid loss (dehydration).  Diseases or conditions such as Parkinson's disease, high blood pressure (hypertension), diabetes, and thyroid problems.  Exposure to extreme heat. DIAGNOSIS  Your health care provider will ask about your symptoms, perform a physical exam, and perform an electrocardiogram (ECG) to record the electrical activity of your heart. Your health care provider may also perform other heart or blood tests to determine the cause of your dizziness. These may include:  Transthoracic echocardiogram (TTE). During echocardiography, sound waves are used to evaluate how blood flows through your heart.  Transesophageal echocardiogram (TEE).  Cardiac monitoring. This allows your health care  provider to monitor your heart rate and rhythm in real time.  Holter monitor. This is a portable device that records your heartbeat and can help diagnose heart arrhythmias. It allows your health care provider to track your heart activity for several days if needed.  Stress tests by exercise or by giving medicine that makes the heart beat faster. TREATMENT  Treatment of dizziness depends on the cause of your symptoms and can vary greatly. HOME CARE INSTRUCTIONS   Drink enough fluids to keep your urine clear or pale yellow. This is especially important in very hot weather. In older adults, it is also important in cold weather.  Take your medicine exactly as directed if your dizziness is caused by medicines. When taking blood pressure medicines, it is especially important to get up slowly.  Rise slowly from chairs and steady yourself until you feel okay.  In the morning, first sit up on the side of the bed. When you feel okay, stand slowly while holding onto something until you know your balance is fine.  Move your legs often if you need to stand in one place for a long time. Tighten and relax your muscles in your legs while standing.  Have someone stay with you for 1-2 days if dizziness continues to be a problem. Do this until you feel you are well enough to stay alone. Have the person call your health care provider if he or she notices changes in you that are concerning.  Do not drive or use heavy machinery if you feel dizzy.  Do not  drink alcohol. SEEK IMMEDIATE MEDICAL CARE IF:   Your dizziness or light-headedness gets worse.  You feel nauseous or vomit.  You have problems talking, walking, or using your arms, hands, or legs.  You feel weak.  You are not thinking clearly or you have trouble forming sentences. It may take a friend or family member to notice this.  You have chest pain, abdominal pain, shortness of breath, or sweating.  Your vision changes.  You notice any  bleeding.  You have side effects from medicine that seems to be getting worse rather than better. MAKE SURE YOU:   Understand these instructions.  Will watch your condition.  Will get help right away if you are not doing well or get worse. Document Released: 03/13/2001 Document Revised: 09/22/2013 Document Reviewed: 04/06/2011 [facility] Patient Information 2015 [place], [place]. This information is not intended to replace advice given to you by your health care provider. Make sure you discuss any questions you have with your health care provider.

## 2014-06-17 NOTE — ED Notes (Signed)
Patient discharged with all personal belongings. 

## 2014-06-18 NOTE — Telephone Encounter (Signed)
Noted. Patient seen in ED yesterday.

## 2014-06-23 ENCOUNTER — Ambulatory Visit (INDEPENDENT_AMBULATORY_CARE_PROVIDER_SITE_OTHER): Payer: Managed Care, Other (non HMO) | Admitting: Family Medicine

## 2014-06-23 ENCOUNTER — Encounter: Payer: Self-pay | Admitting: Family Medicine

## 2014-06-23 VITALS — BP 104/72 | HR 74 | Temp 98.5°F | Ht 66.0 in | Wt 194.5 lb

## 2014-06-23 DIAGNOSIS — I959 Hypotension, unspecified: Secondary | ICD-10-CM

## 2014-06-23 DIAGNOSIS — F411 Generalized anxiety disorder: Secondary | ICD-10-CM

## 2014-06-23 NOTE — Patient Instructions (Addendum)
Cut your Blood pressure medicine in half.  Take your blood pressure once a day for now, vary the time that you take it.   If your blood pressure is less than 100/70, then you can hold it for the day.

## 2014-06-23 NOTE — Progress Notes (Signed)
Pre visit review using our clinic review tool, if applicable. No additional management support is needed unless otherwise documented below in the visit note. 

## 2014-06-23 NOTE — Progress Notes (Signed)
Dr. [name] T. Dionysios Massman, MD, CAQ Sports Medicine [facility] [address] [place], [address] Phone: [phone] Fax: [phone]  06/23/2014  Patient: Phyllis Myers, MRN: [id number], DOB: 13-Oct-1980, 33 y.o.  Primary Physician:  Kerby Nora, MD  Chief Complaint: Hospitalization Follow-up  Subjective:   Phyllis Myers is a 33 y.o. very pleasant female patient who presents with the following:  She is here for Emergency Room followup:  At work and for no reason, thought got really dizzy. Vision went totally lurry and felt black. Tried to get her bearings. Did not feel right, talked to the nurse. Called our office.   Went to [facility], then to the ER. By the time had sat in the waiting room. She was feeling better.   BP Readings from Last 3 Encounters:  06/23/14 104/72  06/17/14 115/69  November 26, 2013 148/108    Husband died unexpectantly 3 months ago. She says that she didn't really feel like she was nervous, anxious, or having a panic attack at that time. She denies ever having any kind of panic attacks previously.  Past Medical History, Surgical History, Social History, Family History, Problem List, Medications, and Allergies have been reviewed and updated if relevant.   GEN: No acute illnesses, no fevers, chills. GI: No n/v/d, eating normally Pulm: No SOB Interactive and getting along well at home.  Otherwise, ROS is as per the HPI.  Objective:   BP 104/72  Pulse 74  Temp(Src) 98.5 F (36.9 C) (Oral)  Ht 5\' 6"  (1.676 m)  Wt 194 lb 8 oz (88.225 kg)  BMI 31.41 kg/m2  SpO2 97%  LMP 05/27/2014  GEN: WDWN, NAD, Non-toxic, A & O x 3 HEENT: Atraumatic, Normocephalic. Neck supple. No masses, No LAD. Ears and Nose: No external deformity. CV: RRR, No M/G/R. No JVD. No thrill. No extra heart sounds. PULM: CTA B, no wheezes, crackles, rhonchi. No retractions. No resp. distress. No accessory muscle use. EXTR: No c/c/e NEURO Normal gait.  PSYCH:  Normally interactive. Conversant. Not depressed or anxious appearing.  Calm demeanor.   Laboratory and Imaging Data: Results for orders placed during the hospital encounter of 06/17/14  CBC      Result Value Ref Range   WBC 10.8 (*) 4.0 - 10.5 K/uL   RBC 4.56  3.87 - 5.11 MIL/uL   Hemoglobin 16.0 (*) 12.0 - 15.0 g/dL   HCT 06/19/14  14.4 - 31.5 %   MCV 98.2  78.0 - 100.0 fL   MCH 35.1 (*) 26.0 - 34.0 pg   MCHC 35.7  30.0 - 36.0 g/dL   RDW 40.0  86.7 - 61.9 %   Platelets 182  150 - 400 K/uL  BASIC METABOLIC PANEL      Result Value Ref Range   Sodium 139  137 - 147 mEq/L   Potassium 3.8  3.7 - 5.3 mEq/L   Chloride 97  96 - 112 mEq/L   CO2 26  19 - 32 mEq/L   Glucose, Bld 104 (*) 70 - 99 mg/dL   BUN 12  6 - 23 mg/dL   Creatinine, Ser 50.9  0.50 - 1.10 mg/dL   Calcium 9.7  8.4 - 3.26 mg/dL   GFR calc non Af Amer >90  >90 mL/min   GFR calc Af Amer >90  >90 mL/min   Anion gap 16 (*) 5 - 15     Assessment and Plan:   Hypotension, unspecified  Generalized anxiety disorder  Keep 1/2 Valium on hand in case feels symptoms   I am suspicious that she got somewhat hypotensive, so I want to decrease her blood pressure medication to 25 mg of Cozaar.  Patient Instructions  Cut your Blood pressure medicine in half.  Take your blood pressure once a day for now, vary the time that you take it.   If your blood pressure is less than 100/70, then you can hold it for the day.      Follow-up: No Follow-up on file.  New Prescriptions   No medications on file   No orders of the defined types were placed in this encounter.    Signed,  Elpidio Galea. Kegan Shepardson, MD   Patient's Medications  New Prescriptions   No medications on file  Previous Medications   DIAZEPAM (VALIUM) 5 MG TABLET    Take 5 mg by mouth 3 (three) times daily as needed for anxiety.   LOSARTAN (COZAAR) 50 MG TABLET    Take 50 mg by mouth daily.  Modified Medications   No medications on file  Discontinued Medications    No medications on file

## 2014-06-24 MED ORDER — LOSARTAN POTASSIUM 50 MG PO TABS: 25.0000 mg | ORAL_TABLET | Freq: Every day | ORAL | Status: DC

## 2014-07-20 ENCOUNTER — Telehealth: Payer: Self-pay | Admitting: Family Medicine

## 2014-07-20 NOTE — Telephone Encounter (Signed)
I will see her then  

## 2014-07-20 NOTE — Telephone Encounter (Signed)
Patient Information:  Caller Name: Jamelah  Phone: [phone]  Patient: Phyllis Myers  Gender: Female  DOB: 1981-10-01  Age: 33 Years  PCP: Kerby Nora (Family Practice)  Pregnant: No  Office Follow Up:  Does the office need to follow up with this patient?: No  Instructions For The Office: N/A  RN Note:  Stopped taking Losartan 1 month ago.  Scheduled appointment at pt's request for 07/21/2014 at 10:15 with Dr. Milinda Antis.  Symptoms  Reason For Call & Symptoms: Onset 07/20/2014 dizziness, lightheadedness, blurred vision and felt hot.  After med and self redirection, sxs resolved.  Reviewed Health History In EMR: Yes  Reviewed Medications In EMR: Yes  Reviewed Allergies In EMR: Yes  Reviewed Surgeries / Procedures: Yes  Date of Onset of Symptoms: 07/20/2014  Treatments Tried: Valium 5 mg at 12:30  Treatments Tried Worked: No OB / GYN:  LMP: 06/29/2014  Guideline(s) Used:  Dizziness  Disposition Per Guideline:   See Today in Office  Reason For Disposition Reached:   Patient wants to be seen  Advice Given:  Temporary Dizziness  is usually a harmless symptom. It can be caused by not drinking enough water during sports or hot weather. It can also be caused by skipping a meal, too much sun exposure, standing up suddenly, standing too long in one place or even a viral illness.  Some Causes of Temporary Dizziness:  Poor Fluid Intake - Not drinking enough fluids and being a little dehydrated is a common cause of temporary dizziness. This is always worse during hot weather.  Standing Up Suddenly - Standing up suddenly (especially getting out of bed) or prolonged standing in one place are common causes of temporary dizziness. Not drinking enough fluids always makes it worse. Certain medications can cause or increase this type of dizziness (e.g., blood pressure medications).  Heat Exposure - Hot weather, hot tubs, or too much sun exposure are common causes of temporary dizziness. Not  drinking enough fluids always makes it worse.  Drink Fluids:  Drink several glasses of fruit juice, other clear fluids, or water. This will improve hydration and blood glucose. If you have a fever or have had heat exposure, make sure the fluids are cold.  Cool Off:  If the weather is hot, apply a cold compress to the forehead or take a cool shower or bath.  Rest for 1-2 Hours:  Lie down with feet elevated for 1 hour. This will improve blood flow and increase blood flow to the brain.  Stand Up Slowly:  In the mornings, sit up for a few minutes before you stand up. That will help your blood flow make the adjustment.  If you have to stand up for long periods of time, contract and relax your leg muscles to help pump the blood back to the heart.  Sit down or lie down if you feel dizzy.  Call Back If:  Still feel dizzy after 2 hours of rest and fluids  Passes out (faints)  You become worse.  RN Overrode Recommendation:  Make Appointment  For Wed., 07/21/2014 at 10:15 with Dr. Milinda Antis.  Appointment Scheduled:  07/21/2014 10:15:00 Appointment Scheduled Provider:  Roxy Manns [facility])

## 2014-07-21 ENCOUNTER — Ambulatory Visit: Payer: Managed Care, Other (non HMO) | Admitting: Family Medicine

## 2014-07-21 ENCOUNTER — Ambulatory Visit (INDEPENDENT_AMBULATORY_CARE_PROVIDER_SITE_OTHER): Payer: Managed Care, Other (non HMO) | Admitting: Family Medicine

## 2014-07-21 ENCOUNTER — Encounter: Payer: Self-pay | Admitting: Family Medicine

## 2014-07-21 VITALS — BP 134/90 | HR 73 | Temp 98.1°F | Ht 66.0 in | Wt 191.5 lb

## 2014-07-21 DIAGNOSIS — R42 Dizziness and giddiness: Secondary | ICD-10-CM | POA: Insufficient documentation

## 2014-07-21 DIAGNOSIS — J302 Other seasonal allergic rhinitis: Secondary | ICD-10-CM

## 2014-07-21 DIAGNOSIS — F410 Panic disorder [episodic paroxysmal anxiety] without agoraphobia: Secondary | ICD-10-CM

## 2014-07-21 DIAGNOSIS — J309 Allergic rhinitis, unspecified: Secondary | ICD-10-CM | POA: Insufficient documentation

## 2014-07-21 HISTORY — DX: Panic disorder (episodic paroxysmal anxiety): F41.0

## 2014-07-21 MED ORDER — FLUTICASONE PROPIONATE 50 MCG/ACT NA SUSP: 2.0000 | Freq: Every day | NASAL | Status: DC

## 2014-07-21 MED ORDER — DIAZEPAM 5 MG PO TABS: 5.0000 mg | ORAL_TABLET | Freq: Three times a day (TID) | ORAL | Status: DC | PRN

## 2014-07-21 NOTE — Patient Instructions (Signed)
For allergy congestion which I think is causing your dizziness - start either zyrtec, claritin or allegra over the counter -one per day  Also start flonase nasal spray once daily as well  Let us know if dizziness is not better in 2 weeks  For panic disorder - SSRI class of medication would be helpful-not until the dizziness is under control

## 2014-07-21 NOTE — Progress Notes (Signed)
Subjective:    Patient ID: Phyllis Myers, female    DOB: [birth year], 33 y.o.   MRN: [id number]  HPI Here with spells of dizziness  Yesterday - had a spell including feelings of "really hot" , fleeting blurred vision in both eyes, and then feeling of anxiety (frantic) She left work and went home and took a valium and laid down- felt better  (after about an hour) No headache with that (in fact she has not had a migaine in a long time)  Also has dizziness/"swimmy headed" feeling all the time on and off   She has had sinus problems Congestion  Drip /throat clearing No sore throat No fever Sometimes ears feel funny "watery" or ringing or full -not painful  No allergy medicine   She used to be on med for depression/ anxiety  She took it one day and stopped it  She read about it and it frightened her   Patient Active Problem List   Diagnosis Date Noted  . Left flank mass 09/30/2013  . Generalized anxiety disorder 09/08/2013  . Palpitations 08/11/2013  . Family history of early CAD 06/09/2013  . Reactive arthritis 01/27/2013  . Common migraine 01/27/2013  . HTN (hypertension) 01/27/2013  . Hyperlipidemia, borderline 01/27/2013  . Hx of abnormal Pap smear 01/27/2013   Past Medical History  Diagnosis Date  . Arthritis   . Hypertension    No past surgical history on file. History  Substance Use Topics  . Smoking status: Never Smoker   . Smokeless tobacco: Never Used  . Alcohol Use: 2.0 oz/week    4 drink(s) per week     Comment: occ   Family History  Problem Relation Age of Onset  . Pulmonary Hypertension Mother   . Cardiomyopathy Mother   . Heart disease Mother   . Heart disease Father 5    MI  . Heart disease Maternal Grandfather   . Heart disease Paternal Grandfather    No Known Allergies Current Outpatient Prescriptions on File Prior to Visit  Medication Sig Dispense Refill  . diazepam (VALIUM) 5 MG tablet Take 5 mg by mouth 3 (three) times daily as  needed for anxiety.       No current facility-administered medications on file prior to visit.       Just had her eyes checked-they were fine     About a mo ago-went to ER for similar symptoms -thought it was panic disorder  Then f/u with Dr Patsy Lager -and she was hypotensive She has since stopped her med completely-losartan  Has had nl bp at home with this (today is the highest it has been)    Review of Systems Review of Systems  Constitutional: Negative for fever, appetite change, fatigue and unexpected weight change.  ENT pos for cong/rhinorrhea and neg for facial pain  Eyes: Negative for pain and visual disturbance.  Respiratory: Negative for cough and shortness of breath.   Cardiovascular: Negative for cp or palpitations    Gastrointestinal: Negative for nausea, diarrhea and constipation.  Genitourinary: Negative for urgency and frequency.  Skin: Negative for pallor or rash   Neurological: Negative for weakness, numbness and headaches.  Hematological: Negative for adenopathy. Does not bruise/bleed easily.  Psychiatric/Behavioral: Negative for dysphoric mood. The patient is anxious with panic attacks         Objective:   Physical Exam  Constitutional: She appears well-developed and well-nourished. No distress.  overwt and well appearing   HENT:  Head: Normocephalic and  atraumatic.  Right Ear: External ear normal.  Left Ear: External ear normal.  Nose: Nose normal.  Mouth/Throat: Oropharynx is clear and moist.  Nares are injected and congested   No sinus tenderness Clear rhinorrhea TMs are clear but dull  Eyes: Conjunctivae and EOM are normal. Pupils are equal, round, and reactive to light. Right eye exhibits no discharge. Left eye exhibits no discharge. No scleral icterus.  2-3 beats of horiz nystagmus bilat   Neck: Normal range of motion. Neck supple. No JVD present. No thyromegaly present.  Cardiovascular: Normal rate, regular rhythm, normal heart sounds and  intact distal pulses.  Exam reveals no gallop.   Pulmonary/Chest: Effort normal and breath sounds normal. No respiratory distress. She has no wheezes. She has no rales.  Abdominal: Soft. Bowel sounds are normal. She exhibits no distension and no mass. There is no tenderness.  Musculoskeletal: She exhibits no edema and no tenderness.  Lymphadenopathy:    She has no cervical adenopathy.  Neurological: She is alert. She has normal strength and normal reflexes. She displays no atrophy. No cranial nerve deficit or sensory deficit. She exhibits normal muscle tone. She displays a negative Romberg sign. Coordination and gait normal.  Skin: Skin is warm and dry. No rash noted. No erythema. No pallor.  Psychiatric: Her speech is normal and behavior is normal. Thought content normal. Her mood appears anxious. Her affect is not blunt, not labile and not inappropriate. She does not exhibit a depressed mood.          Assessment & Plan:   Problem List Items Addressed This Visit     Respiratory   Allergic rhinitis     Disc tx of this to help ETD /dizziness  Trial of steroid nasal spray and non sed antihistamine Update if no improvement       Other   Dizziness - Primary     Suspect this makes anxiety worse and vice versa Suspect an inner ear etiology since it is affected by position  tx allergies and ETD with steroid ns and antihistamine     Panic disorder     Disc opt for tx  Pt was too apprehensive to try SSRI in the past after reading about it  Will tx dizziness first Refilled valium times one for acute use - understands the habit forming potential and sedation from this  Will disc further tx with PCP    Relevant Medications      diazepam (VALIUM) tablet

## 2014-07-21 NOTE — Progress Notes (Signed)
Pre visit review using our clinic review tool, if applicable. No additional management support is needed unless otherwise documented below in the visit note. 

## 2014-07-22 NOTE — Assessment & Plan Note (Signed)
Disc opt for tx  Pt was too apprehensive to try SSRI in the past after reading about it  Will tx dizziness first Refilled valium times one for acute use - understands the habit forming potential and sedation from this  Will disc further tx with PCP

## 2014-07-22 NOTE — Assessment & Plan Note (Signed)
Suspect this makes anxiety worse and vice versa Suspect an inner ear etiology since it is affected by position  tx allergies and ETD with steroid ns and antihistamine

## 2014-07-22 NOTE — Assessment & Plan Note (Signed)
Disc tx of this to help ETD /dizziness  Trial of steroid nasal spray and non sed antihistamine Update if no improvement

## 2014-07-26 ENCOUNTER — Ambulatory Visit: Payer: Managed Care, Other (non HMO) | Admitting: Family Medicine

## 2014-08-11 ENCOUNTER — Encounter: Payer: Self-pay | Admitting: Family Medicine

## 2014-08-11 ENCOUNTER — Ambulatory Visit (INDEPENDENT_AMBULATORY_CARE_PROVIDER_SITE_OTHER): Payer: Managed Care, Other (non HMO) | Admitting: Family Medicine

## 2014-08-11 VITALS — BP 112/80 | HR 81 | Temp 98.1°F | Ht 66.0 in | Wt 192.2 lb

## 2014-08-11 DIAGNOSIS — F411 Generalized anxiety disorder: Secondary | ICD-10-CM

## 2014-08-11 DIAGNOSIS — R42 Dizziness and giddiness: Secondary | ICD-10-CM

## 2014-08-11 DIAGNOSIS — F410 Panic disorder [episodic paroxysmal anxiety] without agoraphobia: Secondary | ICD-10-CM

## 2014-08-11 MED ORDER — FLUOXETINE HCL 20 MG PO TABS: 20.0000 mg | ORAL_TABLET | Freq: Every day | ORAL | Status: DC

## 2014-08-11 NOTE — Progress Notes (Signed)
Pre visit review using our clinic review tool, if applicable. No additional management support is needed unless otherwise documented below in the visit note. 

## 2014-08-11 NOTE — Progress Notes (Signed)
Dr. [name] T. Juliocesar Blasius, MD, [facility] [address] [place], [address] Phone: [phone] Fax: [phone]  08/11/2014  Patient: Phyllis Myers, MRN: [id number], DOB: [birth year], 33 y.o.  Primary Physician:  Kerby Nora, MD  Chief Complaint: Follow-up  Subjective:   Phyllis Myers is a 33 y.o. very pleasant female patient who presents with the following:  F/u dizziness and anxiety:  Dizziness has gotten a little bit better. With moving and walking. Feels like with on a hill, and better since on the allergy medicine.   Anxiety - one year ago.   Past Medical History, Surgical History, Social History, Family History, Problem List, Medications, and Allergies have been reviewed and updated if relevant.   GEN: No acute illnesses, no fevers, chills. GI: No n/v/d, eating normally Pulm: No SOB Interactive and getting along well at home.  Otherwise, ROS is as per the HPI.  Objective:   BP 112/80 mmHg  Pulse 81  Temp(Src) 98.1 F (36.7 C) (Oral)  Ht 5\' 6"  (1.676 m)  Wt 192 lb 4 oz (87.204 kg)  BMI 31.04 kg/m2  LMP 08/01/2014  GEN: WDWN, NAD, Non-toxic, A & O x 3 HEENT: Atraumatic, Normocephalic. Neck supple. No masses, No LAD. Ears and Nose: No external deformity. CV: RRR, No M/G/R. No JVD. No thrill. No extra heart sounds. PULM: CTA B, no wheezes, crackles, rhonchi. No retractions. No resp. distress. No accessory muscle use. EXTR: No c/c/e NEURO Normal gait.  PSYCH: Normally interactive. Conversant. Anxious.   Laboratory and Imaging Data:  Assessment and Plan:   Panic disorder  Generalized anxiety disorder  Dizziness  >25 minutes spent in face to face time with patient, >50% spent in counselling or coordination of care: pleasant young lady who I remember well who is intermittently been having problems with panic attacks and anxiety for least a year. My partner previously gave her some Effexor to take, and  she only took this for one day. She now has some Valium that she is taking intermittently and rarely. She is currently having panic attacks about every other day. When they happen they are quite debilitating. On the backdrop of this, the patient's husband died earlier in the year. This was quite unexpected. She is not having any significant depression.  She is also been having some filed vertigo and dizziness. This will only last relatively transiently with head motion.  Patient Instructions  For the first week, take every other day. Then increase to once a day     Follow-up: Return in about 6 weeks (around 09/22/2014).  New Prescriptions   FLUOXETINE (PROZAC) 20 MG TABLET    Take 1 tablet (20 mg total) by mouth daily.   No orders of the defined types were placed in this encounter.    Signed,  09/24/2014. Erland Vivas, MD   Patient's Medications  New Prescriptions   FLUOXETINE (PROZAC) 20 MG TABLET    Take 1 tablet (20 mg total) by mouth daily.  Previous Medications   CETIRIZINE (ZYRTEC) 10 MG TABLET    Take 10 mg by mouth daily.   DIAZEPAM (VALIUM) 5 MG TABLET    Take 1 tablet (5 mg total) by mouth 3 (three) times daily as needed for anxiety (watch out for sedation).  Modified Medications   No medications on file  Discontinued Medications   ACETAMINOPHEN (TYLENOL) 325 MG TABLET    Take by mouth.   FLUTICASONE (FLONASE) 50 MCG/ACT NASAL SPRAY  Place 2 sprays into both nostrils daily.   IBUPROFEN (ADVIL,MOTRIN) 800 MG TABLET    Take by mouth.   LOSARTAN (COZAAR) 50 MG TABLET    Take by mouth.

## 2014-08-11 NOTE — Patient Instructions (Signed)
For the first week, take every other day. Then increase to once a day

## 2014-08-17 ENCOUNTER — Telehealth: Payer: Self-pay

## 2014-08-17 NOTE — Telephone Encounter (Signed)
Pt left v/m requesting prozac changed from tablet to capsule; prozac tablet cost pt $90.00 and pt was told by pharmacist prozac capsule is more affordable. Pt request cb.Please advise. Bridget Hartshorn.

## 2014-08-18 MED ORDER — FLUOXETINE HCL 20 MG PO CAPS: 20.0000 mg | ORAL_CAPSULE | Freq: Every day | ORAL | Status: DC

## 2014-08-18 NOTE — Telephone Encounter (Signed)
Please help change to prozac capsule, same dose

## 2014-08-18 NOTE — Telephone Encounter (Signed)
Prescription for Prozac capsules sent to [facility] in [place].  Phyllis Myers notified.

## 2014-09-09 ENCOUNTER — Other Ambulatory Visit: Payer: Self-pay | Admitting: Family Medicine

## 2014-09-09 NOTE — Telephone Encounter (Signed)
Last office visit 08/11/2014.  Last refilled 07/21/2014 for #30 with no refills.  Ok to refill?

## 2014-09-09 NOTE — Telephone Encounter (Signed)
Called to [facility] in [place]. 

## 2014-09-09 NOTE — Telephone Encounter (Signed)
Ok to ref 30, 0 ref 

## 2014-09-22 ENCOUNTER — Ambulatory Visit: Payer: Managed Care, Other (non HMO) | Admitting: Family Medicine

## 2014-10-06 ENCOUNTER — Ambulatory Visit (INDEPENDENT_AMBULATORY_CARE_PROVIDER_SITE_OTHER): Payer: Managed Care, Other (non HMO) | Admitting: Family Medicine

## 2014-10-06 ENCOUNTER — Encounter: Payer: Self-pay | Admitting: Family Medicine

## 2014-10-06 VITALS — BP 100/68 | HR 76 | Temp 97.8°F | Ht 66.0 in | Wt 189.2 lb

## 2014-10-06 DIAGNOSIS — F410 Panic disorder [episodic paroxysmal anxiety] without agoraphobia: Secondary | ICD-10-CM

## 2014-10-06 DIAGNOSIS — F411 Generalized anxiety disorder: Secondary | ICD-10-CM

## 2014-10-06 NOTE — Progress Notes (Signed)
   Dr. [name] T. Jacolyn Joaquin, MD, [facility] [address] [place], [address] Phone: [phone] Fax: [phone]  10/06/2014  Patient: Phyllis Myers, MRN: [id number], DOB: 1981-05-27, 34 y.o.  Primary Physician:  [name], MD  Chief Complaint: Follow-up  Subjective:   Phyllis Myers is a 34 y.o. very pleasant female patient who presents with the following:  Now has been having fewer, 3 or 4. Had some minor panic attacks.    Past Medical History, Surgical History, Social History, Family History, Problem List, Medications, and Allergies have been reviewed and updated if relevant.   GEN: No acute illnesses, no fevers, chills. GI: No n/v/d, eating normally Pulm: No SOB Interactive and getting along well at home.  Otherwise, ROS is as per the HPI.  Objective:   BP 100/68 mmHg  Pulse 76  Temp(Src) 97.8 F (36.6 C) (Oral)  Ht 5\' 6"  (1.676 m)  Wt 189 lb 4 oz (85.843 kg)  BMI 30.56 kg/m2  LMP 10/01/2014  GEN: WDWN, NAD, Non-toxic, A & O x 3 HEENT: Atraumatic, Normocephalic. Neck supple. No masses, No LAD. Ears and Nose: No external deformity. CV: RRR, No M/G/R. No JVD. No thrill. No extra heart sounds. PULM: CTA B, no wheezes, crackles, rhonchi. No retractions. No resp. distress. No accessory muscle use. EXTR: No c/c/e NEURO Normal gait.  PSYCH: Normally interactive. Conversant. Not depressed or anxious appearing.  Calm demeanor.   Laboratory and Imaging Data:  Assessment and Plan:   Panic disorder  Generalized anxiety disorder  >15 minutes spent in face to face time with patient, >50% spent in counselling or coordination of care:  The patient is improved compared to the last time I saw her.  She was having panic attacks essentially every day to every other day, and now she thinks she has had approximately 3 or 4 since our last office visit.  She initially took her Prozac every other day for about 3 or 4 weeks,  and then increase it to 20 mg.  She has been on this for approximately 3 weeks.  At this dose.  She has been taking some Valium half a tablet to a tablet as needed, less than daily.  She was able to get through the holidays okay, good family time, but this did remind her of her husband's passing.  Overall, she is improved.  We are going to give her some more time at the full strength 20 mg Prozac dose and recheck in 7 weeks.  Follow-up: Return in about 7 weeks (around 11/24/2014).  New Prescriptions   No medications on file   No orders of the defined types were placed in this encounter.    Signed,  11/26/2014. Shamarra Warda, MD   Patient's Medications  New Prescriptions   No medications on file  Previous Medications   CETIRIZINE (ZYRTEC) 10 MG TABLET    Take 10 mg by mouth daily.   DIAZEPAM (VALIUM) 5 MG TABLET    TAKE 1 TABLET BY MOUTH THREE TIMES DAILY AS NEEDED FOR ANXIETY   FLUOXETINE (PROZAC) 20 MG CAPSULE    Take 1 capsule (20 mg total) by mouth daily.  Modified Medications   No medications on file  Discontinued Medications   No medications on file

## 2014-10-06 NOTE — Progress Notes (Signed)
Pre visit review using our clinic review tool, if applicable. No additional management support is needed unless otherwise documented below in the visit note. 

## 2014-11-03 ENCOUNTER — Other Ambulatory Visit: Payer: Self-pay | Admitting: Family Medicine

## 2014-11-03 NOTE — Telephone Encounter (Signed)
Last office visit 10/06/2014.  Last refilled 09/09/2014 for #30 with no refills.  Ok to refill?

## 2014-11-03 NOTE — Telephone Encounter (Signed)
Ok to refill.  #30, 1 refill 

## 2014-11-03 NOTE — Telephone Encounter (Signed)
Called to [facility] in [place]. 

## 2014-11-24 ENCOUNTER — Encounter: Payer: Self-pay | Admitting: Family Medicine

## 2014-11-24 ENCOUNTER — Ambulatory Visit (INDEPENDENT_AMBULATORY_CARE_PROVIDER_SITE_OTHER): Payer: Managed Care, Other (non HMO) | Admitting: Family Medicine

## 2014-11-24 VITALS — BP 100/64 | HR 70 | Temp 98.2°F | Ht 66.0 in | Wt 192.8 lb

## 2014-11-24 DIAGNOSIS — G4700 Insomnia, unspecified: Secondary | ICD-10-CM

## 2014-11-24 DIAGNOSIS — F410 Panic disorder [episodic paroxysmal anxiety] without agoraphobia: Secondary | ICD-10-CM

## 2014-11-24 DIAGNOSIS — F411 Generalized anxiety disorder: Secondary | ICD-10-CM

## 2014-11-24 NOTE — Progress Notes (Signed)
Pre visit review using our clinic review tool, if applicable. No additional management support is needed unless otherwise documented below in the visit note. 

## 2014-11-24 NOTE — Patient Instructions (Signed)
Insomnia:  Melatonin up to 10 mg can be taken 1 hour before sleep very safely every day  Antihistamines: 2 tabs of Benadryl is OK Or can also take 2 Dramamine (get the older version that can cause drowsiness) Or Unisom (doxylamine)  

## 2014-11-26 DIAGNOSIS — G4700 Insomnia, unspecified: Secondary | ICD-10-CM | POA: Insufficient documentation

## 2014-11-26 NOTE — Progress Notes (Signed)
   Dr. [name] T. Momoka Stringfield, MD, [facility] [address] [place], [address] Phone: [phone] Fax: [phone]  11/24/2014  Patient: Phyllis Myers, MRN: [id number], DOB: [birth year], 34 y.o.  Primary Physician:  [name], MD  Chief Complaint: Follow-up   Generalized anxiety disorder  Panic disorder  Insomnia  >15 minutes spent in face to face time with patient, >50% spent in counselling or coordination of care: The patient has had a setback in her recovery.  She got her husband's autopsy report back, and he died from a drug overdose.  To her knowledge she never had a problem with drugs and was not on drugs routinely.  She is quite upset about this, has some anger, guilt, and she is tearful some in the office today.  Her panic attacks are still doing better compared to before, but they have come back somewhat in the last 2 weeks and she got this news.  Particularly her friends have been supportive.  She has been reluctant to talk about this with some of her family.  I have encouraged her to go back and have some additional grief counseling.  Keep her current dose of Prozac the same.  Follow-up: 2 mo  Patient Instructions  Insomnia:  Melatonin up to 10 mg can be taken 1 hour before sleep very safely every day  Antihistamines: 2 tabs of Benadryl is OK Or can also take 2 Dramamine (get the older version that can cause drowsiness) Or Unisom (doxylamine)      Signed,  [name] T. Jorel Gravlin, MD   Patient's Medications  New Prescriptions   No medications on file  Previous Medications   CETIRIZINE (ZYRTEC) 10 MG TABLET    Take 10 mg by mouth daily.   DIAZEPAM (VALIUM) 5 MG TABLET    TAKE 1 TABLET BY MOUTH THREE TIMES DAILY AS NEEDED FOR ANXIETY.   FLUOXETINE (PROZAC) 20 MG CAPSULE    Take 1 capsule (20 mg total) by mouth daily.  Modified Medications   No medications on file  Discontinued Medications   No medications on  file

## 2015-01-26 ENCOUNTER — Ambulatory Visit: Payer: Managed Care, Other (non HMO) | Admitting: Family Medicine

## 2015-01-26 DIAGNOSIS — Z0289 Encounter for other administrative examinations: Secondary | ICD-10-CM

## 2015-02-02 ENCOUNTER — Encounter: Payer: Self-pay | Admitting: Family Medicine

## 2015-02-02 ENCOUNTER — Ambulatory Visit (INDEPENDENT_AMBULATORY_CARE_PROVIDER_SITE_OTHER): Payer: Managed Care, Other (non HMO) | Admitting: Family Medicine

## 2015-02-02 VITALS — BP 111/72 | HR 82 | Temp 98.6°F | Ht 66.0 in | Wt 193.0 lb

## 2015-02-02 DIAGNOSIS — F410 Panic disorder [episodic paroxysmal anxiety] without agoraphobia: Secondary | ICD-10-CM

## 2015-02-02 DIAGNOSIS — F411 Generalized anxiety disorder: Secondary | ICD-10-CM | POA: Diagnosis not present

## 2015-02-02 MED ORDER — DIAZEPAM 5 MG PO TABS: ORAL_TABLET | ORAL | Status: DC

## 2015-02-02 MED ORDER — FLUOXETINE HCL 20 MG PO CAPS: 20.0000 mg | ORAL_CAPSULE | Freq: Every day | ORAL | Status: DC

## 2015-02-02 NOTE — Progress Notes (Signed)
Pre visit review using our clinic review tool, if applicable. No additional management support is needed unless otherwise documented below in the visit note. 

## 2015-02-02 NOTE — Progress Notes (Signed)
   Dr. [name] T. Nyrie Sigal, MD, [facility] [address] [place], [address] Phone: [phone] Fax: [phone]  02/02/2015  Patient: Phyllis Myers, MRN: [id number], DOB: 1981/07/23, 34 y.o.  Primary Physician:  [name], MD  Chief Complaint: Follow-up  Subjective:   Phyllis Myers is a 34 y.o. very pleasant female patient who presents with the following:  Will get anxious by herself in the afternoon. Will take half a valium then. Taking her prozac still.   Wt Readings from Last 3 Encounters:  02/02/15 193 lb (87.544 kg)  11/24/14 192 lb 12 oz (87.431 kg)  10/06/14 189 lb 4 oz (85.843 kg)      Past Medical History, Surgical History, Social History, Family History, Problem List, Medications, and Allergies have been reviewed and updated if relevant.   GEN: No acute illnesses, no fevers, chills. GI: No n/v/d, eating normally Pulm: No SOB Interactive and getting along well at home.  Otherwise, ROS is as per the HPI.  Objective:   BP 111/72 mmHg  Pulse 82  Temp(Src) 98.6 F (37 C) (Oral)  Ht 5\' 6"  (1.676 m)  Wt 193 lb (87.544 kg)  BMI 31.17 kg/m2  LMP 01/19/2015 (Approximate)   GEN: WDWN, NAD, Non-toxic, Alert & Oriented x 3 HEENT: Atraumatic, Normocephalic.  Ears and Nose: No external deformity. EXTR: No clubbing/cyanosis/edema NEURO: Normal gait.  PSYCH: Normally interactive. Conversant. Not depressed or anxious appearing.  Calm demeanor.   Laboratory and Imaging Data:  Assessment and Plan:   Generalized anxiety disorder  Panic disorder  >15 minutes spent in face to face time with patient, >50% spent in counselling or coordination of care: The patient seems to be doing quite a bit better compared to last time I saw here.  At that time, she was quite upset, when she got the autopsy results from her husband's passing secondary to drug overdose.  She was quite tearful.  Now, she is doing okay, she has  some of her good friends that she is seeing basically every day.  Work is going okay.  She occasionally does have some panic attacks, and these are usually at work.  Right now she is taking a half of a Valium, and occasionally will take a full Valium if the initial half a tablet does not work.  She also feels better since she has been on the Prozac.  She wants to stay on this for now.  She seems to is stabilized.  Follow-up with me if needed, otherwise not for approximately a year.  Signed,  01/21/2015. Albin Duckett, MD   Patient's Medications  New Prescriptions   No medications on file  Previous Medications   CETIRIZINE (ZYRTEC) 10 MG TABLET    Take 10 mg by mouth daily.  Modified Medications   Modified Medication Previous Medication   DIAZEPAM (VALIUM) 5 MG TABLET diazepam (VALIUM) 5 MG tablet      TAKE 1 TABLET BY MOUTH THREE TIMES DAILY AS NEEDED FOR ANXIETY.    TAKE 1 TABLET BY MOUTH THREE TIMES DAILY AS NEEDED FOR ANXIETY.   FLUOXETINE (PROZAC) 20 MG CAPSULE FLUoxetine (PROZAC) 20 MG capsule      Take 1 capsule (20 mg total) by mouth daily.    Take 1 capsule (20 mg total) by mouth daily.  Discontinued Medications   No medications on file

## 2015-02-11 ENCOUNTER — Encounter: Payer: Self-pay | Admitting: Family Medicine

## 2015-04-20 ENCOUNTER — Encounter: Payer: Self-pay | Admitting: Family Medicine

## 2015-04-20 ENCOUNTER — Ambulatory Visit (INDEPENDENT_AMBULATORY_CARE_PROVIDER_SITE_OTHER): Payer: Managed Care, Other (non HMO) | Admitting: Family Medicine

## 2015-04-20 VITALS — BP 118/68 | HR 70 | Temp 98.4°F | Wt 185.4 lb

## 2015-04-20 DIAGNOSIS — J3089 Other allergic rhinitis: Secondary | ICD-10-CM

## 2015-04-20 DIAGNOSIS — H6980 Other specified disorders of Eustachian tube, unspecified ear: Secondary | ICD-10-CM | POA: Insufficient documentation

## 2015-04-20 DIAGNOSIS — H6983 Other specified disorders of Eustachian tube, bilateral: Secondary | ICD-10-CM | POA: Diagnosis not present

## 2015-04-20 NOTE — Assessment & Plan Note (Signed)
Continue zyrtec.  

## 2015-04-20 NOTE — Patient Instructions (Signed)
Start nasal saline irrigation daily or spray 2-3 times a day. Continue zyrtec generic. Start flonase 2 sprays per nostril daily. Could also use a decongestant like mucinex D during the day. Call for fever, unilateral face pain and increase in ear pain.

## 2015-04-20 NOTE — Progress Notes (Signed)
   Subjective:    Patient ID: Phyllis Myers, female    DOB: 11-27-1980, 34 y.o.   MRN: [id number]  Sore Throat  This is a new problem. The current episode started more than 1 month ago. The problem has been waxing and waning. The pain is worse on the right side. There has been no fever. Associated symptoms include congestion, ear pain and swollen glands. Pertinent negatives include no ear discharge, headaches, trouble swallowing or vomiting. Associated symptoms comments: Sore in mornings when she gets up, right gland swollen this AM.  some post nasal drip, clearing throat.. She has had no exposure to strep or mono. Treatments tried: using claritin. The treatment provided no relief.  Otalgia  There is pain in both ears. The current episode started in the past 7 days (2 days). The problem has been waxing and waning. Pertinent negatives include no ear discharge, headaches or vomiting. She has tried nothing for the symptoms. There is no history of a chronic ear infection, hearing loss or a tympanostomy tube.   Social History /Family History/Past Medical History reviewed and updated if needed.    Review of Systems  HENT: Positive for congestion and ear pain. Negative for ear discharge and trouble swallowing.   Gastrointestinal: Negative for vomiting.  Neurological: Negative for headaches.       Objective:   Physical Exam        Assessment & Plan:

## 2015-04-20 NOTE — Assessment & Plan Note (Signed)
No clear infeciton.  Treat with nasla saline, nasal steroid and decongestant and mucolytic.  Call if not improving as expected.

## 2015-04-20 NOTE — Progress Notes (Signed)
Pre visit review using our clinic review tool, if applicable. No additional management support is needed unless otherwise documented below in the visit note. 

## 2015-08-10 ENCOUNTER — Other Ambulatory Visit: Payer: Self-pay | Admitting: Family Medicine

## 2015-08-10 ENCOUNTER — Encounter: Payer: Self-pay | Admitting: Family Medicine

## 2015-08-10 ENCOUNTER — Ambulatory Visit (INDEPENDENT_AMBULATORY_CARE_PROVIDER_SITE_OTHER): Payer: Managed Care, Other (non HMO) | Admitting: Family Medicine

## 2015-08-10 VITALS — BP 110/74 | HR 73 | Temp 97.5°F | Wt 189.2 lb

## 2015-08-10 DIAGNOSIS — J069 Acute upper respiratory infection, unspecified: Secondary | ICD-10-CM

## 2015-08-10 MED ORDER — HYDROCOD POLST-CPM POLST ER 10-8 MG/5ML PO SUER: 5.0000 mL | Freq: Two times a day (BID) | ORAL | Status: DC | PRN

## 2015-08-10 NOTE — Telephone Encounter (Signed)
Ok to refill 30, 3 ref 

## 2015-08-10 NOTE — Telephone Encounter (Signed)
Diazepam called into [facility].

## 2015-08-10 NOTE — Patient Instructions (Signed)
Great to meet you.   Treat sympotmatically with Mucinex, nasal saline irrigation, and Tylenol/Ibuprofen.  You can use warm compresses.  Cough suppressant at night.   Call if not improving as expected in 5-7 days.

## 2015-08-10 NOTE — Telephone Encounter (Signed)
Last office visit 08/10/2015 with Dr. Dayton Martes.   Last refilled 02/02/2015 for #30 with 3 refills.  Ok to refill?

## 2015-08-10 NOTE — Progress Notes (Signed)
SUBJECTIVE:  Phyllis Myers is a 34 y.o. female who complains of coryza and dry cough for 14 days. She denies a history of anorexia, chest pain and chills and denies a history of asthma. Patient denies smoke cigarettes.   Current Outpatient Prescriptions on File Prior to Visit  Medication Sig Dispense Refill  . cetirizine (ZYRTEC) 10 MG tablet Take 10 mg by mouth daily.    . diazepam (VALIUM) 5 MG tablet TAKE 1 TABLET BY MOUTH THREE TIMES DAILY AS NEEDED FOR ANXIETY. 30 tablet 3  . FLUoxetine (PROZAC) 20 MG capsule Take 1 capsule (20 mg total) by mouth daily. 30 capsule 11   No current facility-administered medications on file prior to visit.    No Known Allergies  Past Medical History  Diagnosis Date  . Arthritis   . Hypertension     No past surgical history on file.  Family History  Problem Relation Age of Onset  . Pulmonary Hypertension Mother   . Cardiomyopathy Mother   . Heart disease Mother   . Heart disease Father 49    MI  . Heart disease Maternal Grandfather   . Heart disease Paternal Grandfather     Social History   Social History  . Marital Status: Single    Spouse Name: N/A  . Number of Children: N/A  . Years of Education: N/A   Occupational History  . Not on file.   Social History Main Topics  . Smoking status: Never Smoker   . Smokeless tobacco: Never Used  . Alcohol Use: 2.0 oz/week    4 drink(s) per week     Comment: occ  . Drug Use: No  . Sexual Activity: Yes    Birth Control/ Protection: Condom   Other Topics Concern  . Not on file   Social History Narrative   Married, no kids.   The PMH, PSH, Social History, Family History, Medications, and allergies have been reviewed in [facility], and have been updated if relevant.  OBJECTIVE: BP 110/74 mmHg  Pulse 73  Temp(Src) 97.5 F (36.4 C) (Oral)  Wt 189 lb 4 oz (85.843 kg)  SpO2 98%  LMP 08/06/2015  She appears well, vital signs are as noted. Ears normal.  Throat and pharynx normal.  Neck  supple. No adenopathy in the neck. Nose is congested. Sinuses non tender. The chest is clear, without wheezes or rales.  ASSESSMENT:  viral upper respiratory illness  PLAN: Symptomatic therapy suggested: push fluids, rest and return office visit prn if symptoms persist or worsen. Lack of antibiotic effectiveness discussed with her. Call or return to clinic prn if these symptoms worsen or fail to improve as anticipated.

## 2015-08-10 NOTE — Progress Notes (Signed)
Pre visit review using our clinic review tool, if applicable. No additional management support is needed unless otherwise documented below in the visit note. 

## 2015-08-17 ENCOUNTER — Telehealth: Payer: Self-pay

## 2015-08-17 MED ORDER — AZITHROMYCIN 250 MG PO TABS: ORAL_TABLET | ORAL | Status: DC

## 2015-08-17 NOTE — Telephone Encounter (Signed)
Spoke to pt and advised per Dr Dayton Martes; informed Rx sent

## 2015-08-17 NOTE — Telephone Encounter (Signed)
Given duration and progression of symptoms, will treat zpack.  eRx sent.  Please keep [place] updated.

## 2015-08-17 NOTE — Telephone Encounter (Signed)
Pt left v/m; pt was seen 08/10/15; pt is not feeling any better; pt has prod cough with green phlegm,h/a and hurts in chest when coughs.Wheezing when coughs.pt has not taken temp but feels warm. No S/T, SOB. Tussionex makes pt nauseated but she is taking for cough.  pt request med called [email or web address] request cb.

## 2015-11-09 ENCOUNTER — Encounter: Payer: Self-pay | Admitting: Family Medicine

## 2015-11-09 ENCOUNTER — Ambulatory Visit (INDEPENDENT_AMBULATORY_CARE_PROVIDER_SITE_OTHER): Payer: Managed Care, Other (non HMO) | Admitting: Family Medicine

## 2015-11-09 ENCOUNTER — Ambulatory Visit (INDEPENDENT_AMBULATORY_CARE_PROVIDER_SITE_OTHER)
Admission: RE | Admit: 2015-11-09 | Discharge: 2015-11-09 | Disposition: A | Discharge status: Home / Self Care | Payer: Managed Care, Other (non HMO) | Source: Ambulatory Visit | Attending: Family Medicine | Admitting: Family Medicine

## 2015-11-09 VITALS — BP 110/80 | HR 70 | Temp 97.9°F | Ht 66.0 in | Wt 189.2 lb

## 2015-11-09 DIAGNOSIS — R059 Cough, unspecified: Secondary | ICD-10-CM

## 2015-11-09 DIAGNOSIS — J189 Pneumonia, unspecified organism: Secondary | ICD-10-CM | POA: Diagnosis not present

## 2015-11-09 DIAGNOSIS — D849 Immunodeficiency, unspecified: Secondary | ICD-10-CM | POA: Diagnosis not present

## 2015-11-09 DIAGNOSIS — D899 Disorder involving the immune mechanism, unspecified: Secondary | ICD-10-CM

## 2015-11-09 DIAGNOSIS — R05 Cough: Secondary | ICD-10-CM

## 2015-11-09 MED ORDER — ALBUTEROL SULFATE HFA 108 (90 BASE) MCG/ACT IN AERS: 2.0000 | INHALATION_SPRAY | Freq: Four times a day (QID) | RESPIRATORY_TRACT | Status: DC | PRN

## 2015-11-09 MED ORDER — DOXYCYCLINE HYCLATE 100 MG PO TABS: 100.0000 mg | ORAL_TABLET | Freq: Two times a day (BID) | ORAL | Status: DC

## 2015-11-09 MED ORDER — OMEPRAZOLE 20 MG PO CPDR: 20.0000 mg | DELAYED_RELEASE_CAPSULE | Freq: Every day | ORAL | Status: DC

## 2015-11-09 NOTE — Progress Notes (Signed)
Dr. [name] T. Nechemia Chiappetta, MD, CAQ Sports Medicine Primary Care and Sports Medicine [address] [place], [address] Phone: [phone] Fax: [phone]  11/09/2015  Patient: Phyllis Myers, MRN: [id number], DOB: [birth year], 35 y.o.  Primary Physician:  [name], MD   Chief Complaint  Patient presents with  . Cough    since Nov.  Worse in the morning  . Dizziness   Subjective:   Phyllis Myers is a 35 y.o. very pleasant female patient who presents with the following:  Since November, and worse in the morning. Coughing until the point where chest will hurt.  Never had asthma or lung disease.  On Enbrel.   She is coughing quite a bit ongoing throughout  The last 2 or 3 months.  The only history that she can also think that correlates was this, is that she has had some significant reflux symptoms at times.  She denies having a history of asthma, smoking, and she has no emphysema.  She does have some mild allergies that are at baseline.  No fever.  Past Medical History, Surgical History, Social History, Family History, Problem List, Medications, and Allergies have been reviewed and updated if relevant.  Patient Active Problem List   Diagnosis Date Noted  . 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 Panic disorder 07/21/2014    Priority: Medium  . Generalized anxiety disorder 09/08/2013    Priority: Medium  . Eustachian tube dysfunction 04/20/2015  . Insomnia 11/26/2014  . Allergic rhinitis 07/21/2014  . Palpitations 08/11/2013  . Family history of early CAD 06/09/2013  . Common migraine 01/27/2013  . HTN (hypertension) 01/27/2013  . Hyperlipidemia, borderline 01/27/2013  . Hx of abnormal Pap smear 01/27/2013    Past Medical History  Diagnosis Date  . Arthritis   . Hypertension     No past surgical history on file.  Social History   Social History  . Marital Status: Single    Spouse Name: N/A  . Number of Children: N/A  . Years  of Education: N/A   Occupational History  . Not on file.   Social History Main Topics  . Smoking status: Never Smoker   . Smokeless tobacco: Never Used  . Alcohol Use: 2.0 oz/week    4 drink(s) per week     Comment: occ  . Drug Use: No  . Sexual Activity: Yes    Birth Control/ Protection: Condom   Other Topics Concern  . Not on file   Social History Narrative   Married, no kids.    Family History  Problem Relation Age of Onset  . Pulmonary Hypertension Mother   . Cardiomyopathy Mother   . Heart disease Mother   . Heart disease Father 42    MI  . Heart disease Maternal Grandfather   . Heart disease Paternal Grandfather     No Known Allergies  Medication list reviewed and updated in full in [place] [facility].  ROS: GEN: Acute illness details above GI: Tolerating PO intake GU: maintaining adequate hydration and urination Pulm: No SOB Interactive and getting along well at home.  Otherwise, ROS is as per the HPI.   Objective:   BP 110/80 mmHg  Pulse 70  Temp(Src) 97.9 F (36.6 C) (Oral)  Ht 5\' 6"  (1.676 m)  Wt 189 lb 4 oz (85.843 kg)  BMI 30.56 kg/m2  SpO2 97%   GEN: WDWN, NAD, Non-toxic, A & O x 3 HEENT: Atraumatic, Normocephalic. Neck  supple. No masses, No LAD. Ears and Nose: No external deformity. CV: RRR, No M/G/R. No JVD. No thrill. No extra heart sounds. PULM: CTA B, no wheezes, crackles, rhonchi. No retractions. No resp. distress. No accessory muscle use. EXTR: No c/c/e NEURO Normal gait.  PSYCH: Normally interactive. Conversant. Not depressed or anxious appearing.  Calm demeanor.     Laboratory and Imaging Data: Dg Chest 2 View  11/09/2015  CLINICAL DATA:  Cough. EXAM: CHEST  2 VIEW COMPARISON:  No prior. FINDINGS: Mediastinum hilar structures normal. Low lung volumes with mild bibasilar atelectasis and or infiltrate. No pleural effusion or pneumothorax. No acute bony abnormality. IMPRESSION: Low lung volumes with mild bibasilar atelectasis  and/or infiltrates. Electronically Signed   By: Maisie Fus  Register   On: 11/09/2015 12:36     Assessment and Plan:   Walking pneumonia  Cough - Plan: DG Chest 2 View  Immunocompromised [facility]) - Plan: DG Chest 2 View  I felt like this was more likely related to reflux given the timing, but given the official interpretation of her chest x-ray and that the patient is on Biologics, I would like to treat her with doxycycline for presumptive pneumonia, more likely walking pneumonia.  Follow-up: No Follow-up on file.  New Prescriptions   ALBUTEROL (PROVENTIL HFA;VENTOLIN HFA) 108 (90 BASE) MCG/ACT INHALER    Inhale 2 puffs into the lungs every 6 (six) hours as needed for wheezing or shortness of breath.   DOXYCYCLINE (VIBRA-TABS) 100 MG TABLET    Take 1 tablet (100 mg total) by mouth 2 (two) times daily.   OMEPRAZOLE (PRILOSEC) 20 MG CAPSULE    Take 1 capsule (20 mg total) by mouth daily.   Modified Medications   No medications on file   Orders Placed This Encounter  Procedures  . DG Chest 2 View    Signed,  [name] T. Jaylene Arrowood, MD   Patient's Medications  New Prescriptions   ALBUTEROL (PROVENTIL HFA;VENTOLIN HFA) 108 (90 BASE) MCG/ACT INHALER    Inhale 2 puffs into the lungs every 6 (six) hours as needed for wheezing or shortness of breath.   DOXYCYCLINE (VIBRA-TABS) 100 MG TABLET    Take 1 tablet (100 mg total) by mouth 2 (two) times daily.   OMEPRAZOLE (PRILOSEC) 20 MG CAPSULE    Take 1 capsule (20 mg total) by mouth daily.  Previous Medications   CETIRIZINE (ZYRTEC) 10 MG TABLET    Take 10 mg by mouth daily.   DIAZEPAM (VALIUM) 5 MG TABLET    TAKE 1 TABLET BY MOUTH THREE TIMES DAILY AS NEEDED FOR ANXIETY   ETANERCEPT (ENBREL SURECLICK) 50 MG/ML INJECTION    Inject 50 mg into the skin once a week.    LEVONORGESTREL (MIRENA) 20 MCG/24HR IUD    1 each by Intrauterine route once.  Modified Medications   No medications on file  Discontinued Medications   AZITHROMYCIN (ZITHROMAX) 250  MG TABLET    2 tabs by mouth on day 1 followed by 1 tab by mouth daily days 2-5   CHLORPHENIRAMINE-HYDROCODONE (TUSSIONEX PENNKINETIC ER) 10-8 MG/5ML SUER    Take 5 mLs by mouth every 12 (twelve) hours as needed for cough.   FLUOXETINE (PROZAC) 20 MG CAPSULE    Take 1 capsule (20 mg total) by mouth daily.

## 2015-11-09 NOTE — Progress Notes (Signed)
Pre visit review using our clinic review tool, if applicable. No additional management support is needed unless otherwise documented below in the visit note. 

## 2015-11-18 ENCOUNTER — Telehealth: Payer: Self-pay

## 2015-11-18 NOTE — Telephone Encounter (Signed)
Pt left v/m; pt was seen 11/09/15 and pt started doxycycline for pneumonia; pt has 2 more pills to take of doxycycline; pt has had muscle soreness in back,chest and abdomen; pt wants to know if soreness could be side effect of doxycycline or what might be causing the soreness. Pt request cb.

## 2015-11-18 NOTE — Telephone Encounter (Signed)
Yes doxy can cause those SE. If symptoms improved or resolved. Okay to stop doxy. Push fluids.

## 2015-11-18 NOTE — Telephone Encounter (Signed)
Patient advised.

## 2015-12-01 ENCOUNTER — Telehealth: Payer: Self-pay | Admitting: Family Medicine

## 2015-12-01 NOTE — Telephone Encounter (Signed)
Pt has appt 12/02/15 at 2 Pm Dr Ermalene Searing

## 2015-12-01 NOTE — Telephone Encounter (Signed)
Patient Name: Phyllis Myers  DOB: 17-Feb-1981    Initial Comment Caller states MD dx her pneumonia a couple wks ago, almost finished abx. Still having congestion and muscle pains in chest and back.   Nurse Assessment  Nurse: Stefano Gaul, RN, Dwana Curd Date/Time [name] Time): 12/01/2015 2:24:29 PM  Confirm and document reason for call. If symptomatic, describe symptoms. You must click the next button to save text entered. ---Caller states she stopped her antibiotic for diagnosis of pneumonia as she was having side effects. Saw doctor a couple of weeks. She took 9 days of antibiotics. She is still coughing. She still is having back and chest pain. She is still congested. No fever. She takes Embrel.  Has the patient traveled out of the country within the last 30 days? ---No  Does the patient have any new or worsening symptoms? ---Yes  Will a triage be completed? ---Yes  Related visit to physician within the last 2 weeks? ---Yes  Does the PT have any chronic conditions? (i.e. diabetes, asthma, etc.) ---Yes  List chronic conditions. ---reactive rheumatoid arthritis  Is the patient pregnant or possibly pregnant? (Ask all females between the ages of 52-55) ---No  Is this a behavioral health or substance abuse call? ---No     Guidelines    Guideline Title Affirmed Question Affirmed Notes  Cough - Acute Productive SEVERE coughing spells (e.g., whooping sound after coughing, vomiting after coughing)    Final Disposition User   See Physician within 24 Hours Stringer, RN, Vera    Comments  Pt states she already has appt scheduled for tomorrow.   Referrals  REFERRED TO PCP OFFICE   Disagree/Comply: Comply

## 2015-12-02 ENCOUNTER — Ambulatory Visit (INDEPENDENT_AMBULATORY_CARE_PROVIDER_SITE_OTHER): Payer: Managed Care, Other (non HMO) | Admitting: Family Medicine

## 2015-12-02 ENCOUNTER — Encounter: Payer: Self-pay | Admitting: Family Medicine

## 2015-12-02 VITALS — BP 110/70 | HR 76 | Temp 98.3°F | Ht 66.0 in | Wt 192.0 lb

## 2015-12-02 DIAGNOSIS — K219 Gastro-esophageal reflux disease without esophagitis: Secondary | ICD-10-CM

## 2015-12-02 DIAGNOSIS — R05 Cough: Secondary | ICD-10-CM

## 2015-12-02 DIAGNOSIS — R053 Chronic cough: Secondary | ICD-10-CM | POA: Insufficient documentation

## 2015-12-02 MED ORDER — OMEPRAZOLE 40 MG PO CPDR: 40.0000 mg | DELAYED_RELEASE_CAPSULE | Freq: Every day | ORAL | Status: DC

## 2015-12-02 NOTE — Patient Instructions (Addendum)
Increase omeprazole to 40 mg daily   Work on weight loss and avoid triggers for reflux. Try zyrtec  At bedtime, or could try flonase 2 sprays per nostril daily. .. Call if cough and reflux not improved in 2 weeks, sooner if fever or shortness of breath.

## 2015-12-02 NOTE — Assessment & Plan Note (Signed)
No clear ongoing infection.  Likely seoindary to allergies vs. GED vs post inflammatory bronchospasm.  Will have her take prilosec more regularly. Avoid reflux triggers.  Start back on zyrtec and or  Flonase. Call if not improving.

## 2015-12-02 NOTE — Assessment & Plan Note (Signed)
Increase prilosec to 40 mg x 4-6 weeks, then try  To taper off. Avoid triggers.

## 2015-12-02 NOTE — Progress Notes (Signed)
   Subjective:    Patient ID: Phyllis Myers, female    DOB: 1980/12/02, 35 y.o.   MRN: [id number]  HPI  35 year old female pt with history of  CAP, seen on CXR on 2/8.Marland Kitchen Treated with doxy 100 mg BID x 10 days  By Dr Patsy Lager presents for follow up pneumonia.  She reports she had improvement of cough but  Had SE to the antibiotics.. Stopped with only one day left of the symptoms. She is still having productive cough, clear mucus. occ coughing fits. NO SOB, no wheeze. Sore on left lower  . No fever.  post nasal drip.  She did hold enbrel while on the antibiotics.  She continued to have reflux.. Forgets to take prilosec.  Occuring in AMs  Social History /Family History/Past Medical History reviewed and updated if needed. She denies having a history of asthma, smoking, and she has no emphysema. She does have some mild allergies that are at baseline.  She is immunocompromised on Enbrel for reactive arthritis.   Review of Systems  Constitutional: Negative for fever and fatigue.  HENT: Negative for ear pain.   Eyes: Negative for pain.  Respiratory: Negative for chest tightness and shortness of breath.   Cardiovascular: Negative for chest pain, palpitations and leg swelling.  Gastrointestinal: Negative for abdominal pain.  Genitourinary: Negative for dysuria.       Objective:   Physical Exam  Constitutional: Vital signs are normal. She appears well-developed and well-nourished. She is cooperative.  Non-toxic appearance. She does not appear ill. No distress.  HENT:  Head: Normocephalic.  Right Ear: Hearing, tympanic membrane, external ear and ear canal normal. Tympanic membrane is not erythematous, not retracted and not bulging.  Left Ear: Hearing, tympanic membrane, external ear and ear canal normal. Tympanic membrane is not erythematous, not retracted and not bulging.  Nose: No mucosal edema or rhinorrhea. Right sinus exhibits no maxillary sinus tenderness and no frontal sinus  tenderness. Left sinus exhibits no maxillary sinus tenderness and no frontal sinus tenderness.  Mouth/Throat: Uvula is midline, oropharynx is clear and moist and mucous membranes are normal.  Eyes: Conjunctivae, EOM and lids are normal. Pupils are equal, round, and reactive to light. Lids are everted and swept, no foreign bodies found.  Neck: Trachea normal and normal range of motion. Neck supple. Carotid bruit is not present. No thyroid mass and no thyromegaly present.  Cardiovascular: Normal rate, regular rhythm, S1 normal, S2 normal, normal heart sounds, intact distal pulses and normal pulses.  Exam reveals no gallop and no friction rub.   No murmur heard. Pulmonary/Chest: Effort normal and breath sounds normal. No tachypnea. No respiratory distress. She has no decreased breath sounds. She has no wheezes. She has no rhonchi. She has no rales.  Abdominal: Soft. Normal appearance and bowel sounds are normal. There is no tenderness.  Neurological: She is alert.  Skin: Skin is warm, dry and intact. No rash noted.  Psychiatric: Her speech is normal and behavior is normal. Judgment and thought content normal. Her mood appears not anxious. Cognition and memory are normal. She does not exhibit a depressed mood.          Assessment & Plan:

## 2015-12-02 NOTE — Progress Notes (Signed)
Pre visit review using our clinic review tool, if applicable. No additional management support is needed unless otherwise documented below in the visit note. 

## 2016-02-08 ENCOUNTER — Ambulatory Visit (INDEPENDENT_AMBULATORY_CARE_PROVIDER_SITE_OTHER): Payer: Managed Care, Other (non HMO) | Admitting: Family Medicine

## 2016-02-08 ENCOUNTER — Encounter: Payer: Self-pay | Admitting: Family Medicine

## 2016-02-08 VITALS — BP 120/80 | HR 77 | Temp 98.2°F | Ht 66.0 in | Wt 182.2 lb

## 2016-02-08 DIAGNOSIS — R0789 Other chest pain: Secondary | ICD-10-CM | POA: Diagnosis not present

## 2016-02-08 DIAGNOSIS — F410 Panic disorder [episodic paroxysmal anxiety] without agoraphobia: Secondary | ICD-10-CM | POA: Diagnosis not present

## 2016-02-08 DIAGNOSIS — R450 Nervousness: Secondary | ICD-10-CM

## 2016-02-08 DIAGNOSIS — F411 Generalized anxiety disorder: Secondary | ICD-10-CM

## 2016-02-08 DIAGNOSIS — Z13220 Encounter for screening for lipoid disorders: Secondary | ICD-10-CM

## 2016-02-08 DIAGNOSIS — R5383 Other fatigue: Secondary | ICD-10-CM | POA: Diagnosis not present

## 2016-02-08 DIAGNOSIS — Z131 Encounter for screening for diabetes mellitus: Secondary | ICD-10-CM | POA: Diagnosis not present

## 2016-02-08 LAB — HEPATIC FUNCTION PANEL
ALK PHOS: 46 U/L (ref 39–117)
ALT: 33 U/L (ref 0–35)
AST: 32 U/L (ref 0–37)
Albumin: 4.9 g/dL (ref 3.5–5.2)
BILIRUBIN TOTAL: 1 mg/dL (ref 0.2–1.2)
Bilirubin, Direct: 0.2 mg/dL (ref 0.0–0.3)
Total Protein: 7.5 g/dL (ref 6.0–8.3)

## 2016-02-08 LAB — LIPID PANEL
CHOL/HDL RATIO: 2
Cholesterol: 201 mg/dL — ABNORMAL HIGH (ref 0–200)
HDL: 84 mg/dL (ref >39.00)
LDL Cholesterol: 101 mg/dL — ABNORMAL HIGH (ref 0–99)
NONHDL: 117.31
Triglycerides: 82 mg/dL (ref 0.0–149.0)
VLDL: 16.4 mg/dL (ref 0.0–40.0)

## 2016-02-08 LAB — CBC WITH DIFFERENTIAL/PLATELET
Basophils Absolute: 0 10*3/uL (ref 0.0–0.1)
Basophils Relative: 0.5 % (ref 0.0–3.0)
Eosinophils Absolute: 0.1 10*3/uL (ref 0.0–0.7)
Eosinophils Relative: 1.2 % (ref 0.0–5.0)
HCT: 46 % (ref 36.0–46.0)
Hemoglobin: 16.1 g/dL — ABNORMAL HIGH (ref 12.0–15.0)
Lymphocytes Relative: 17 % (ref 12.0–46.0)
Lymphs Abs: 1.5 10*3/uL (ref 0.7–4.0)
MCHC: 34.9 g/dL (ref 30.0–36.0)
MCV: 100.1 fl — ABNORMAL HIGH (ref 78.0–100.0)
MONO ABS: 0.7 10*3/uL (ref 0.1–1.0)
MONOS PCT: 8.3 % (ref 3.0–12.0)
Neutro Abs: 6.6 10*3/uL (ref 1.4–7.7)
Neutrophils Relative %: 73 % (ref 43.0–77.0)
Platelets: 210 10*3/uL (ref 150.0–400.0)
RBC: 4.6 Mil/uL (ref 3.87–5.11)
RDW: 13.5 % (ref 11.5–15.5)
WBC: 9 10*3/uL (ref 4.0–10.5)

## 2016-02-08 LAB — BASIC METABOLIC PANEL
BUN: 12 mg/dL (ref 6–23)
CO2: 27 mEq/L (ref 19–32)
Calcium: 10 mg/dL (ref 8.4–10.5)
Chloride: 101 mEq/L (ref 96–112)
Creatinine, Ser: 0.63 mg/dL (ref 0.40–1.20)
GFR: 113.28 mL/min (ref >60.00)
Glucose, Bld: 104 mg/dL — ABNORMAL HIGH (ref 70–99)
POTASSIUM: 4.3 meq/L (ref 3.5–5.1)
SODIUM: 137 meq/L (ref 135–145)

## 2016-02-08 LAB — TSH: TSH: 1.75 u[IU]/mL (ref 0.35–4.50)

## 2016-02-08 LAB — VITAMIN B12: Vitamin B-12: 284 pg/mL (ref 211–911)

## 2016-02-08 LAB — VITAMIN D 25 HYDROXY (VIT D DEFICIENCY, FRACTURES): VITD: 43.32 ng/mL (ref 30.00–100.00)

## 2016-02-08 MED ORDER — FLUOXETINE HCL 20 MG PO CAPS: 20.0000 mg | ORAL_CAPSULE | Freq: Every day | ORAL | Status: DC

## 2016-02-08 NOTE — Progress Notes (Signed)
Pre visit review using our clinic review tool, if applicable. No additional management support is needed unless otherwise documented below in the visit note. 

## 2016-02-08 NOTE — Progress Notes (Signed)
Dr. [name] T. Andersen Iorio, MD, CAQ Sports Medicine Primary Care and Sports Medicine [address] [place], [address] Phone: [phone] Fax: [phone]  02/08/2016  Patient: Phyllis Myers, MRN: [id number], DOB: 04/07/1981, 35 y.o.  Primary Physician:  [name], MD   Chief Complaint  Patient presents with  . Dizziness    Everything went blurry & black-called 911-?Panic Attack  . Chest Pain   Subjective:   Phyllis Myers is a 35 y.o. very pleasant female patient who presents with the following:  Monday at work, will have some little panic attacks, and then got really so disoriented, could not see anything. Had some chest pain. Felt like she was on fire. Literaly thought that she was having a heart attack and high BP.  BS was normal.  About a year and a half ago - also and went to the hospital.  Records reviewed Negative ETT and office visit from [facility].  Several months ago - stopped her prozac.  Keeps some valium.   Dr. Richardean Chimera - at [facility].     Past Medical History, Surgical History, Social History, Family History, Problem List, Medications, and Allergies have been reviewed and updated if relevant.  Patient Active Problem List   Diagnosis Date Noted  . 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 Panic disorder 07/21/2014    Priority: Medium  . Generalized anxiety disorder 09/08/2013    Priority: Medium  . GERD (gastroesophageal reflux disease) 12/02/2015  . Insomnia 11/26/2014  . Allergic rhinitis 07/21/2014  . Family history of early CAD 06/09/2013  . Common migraine 01/27/2013  . HTN (hypertension) 01/27/2013  . Hyperlipidemia, borderline 01/27/2013  . Hx of abnormal Pap smear 01/27/2013    Past Medical History  Diagnosis Date  . Arthritis   . Hypertension     No past surgical history on file.  Social History   Social History  . Marital Status: Single    Spouse Name: N/A  . Number of Children: N/A    . Years of Education: N/A   Occupational History  . Not on file.   Social History Main Topics  . Smoking status: Never Smoker   . Smokeless tobacco: Never Used  . Alcohol Use: 2.0 oz/week    4 drink(s) per week     Comment: occ  . Drug Use: No  . Sexual Activity: Yes    Birth Control/ Protection: Condom   Other Topics Concern  . Not on file   Social History Narrative   Married, no kids.    Family History  Problem Relation Age of Onset  . Pulmonary Hypertension Mother   . Cardiomyopathy Mother   . Heart disease Mother   . Heart disease Father 70    MI  . Heart disease Maternal Grandfather   . Heart disease Paternal Grandfather     No Known Allergies  Medication list reviewed and updated in full in [place] [facility].   GEN: No acute illnesses, no fevers, chills. GI: No n/v/d, eating normally Pulm: No SOB Interactive and getting along well at home.  Otherwise, ROS is as per the HPI.  Objective:   BP 120/80 mmHg  Pulse 77  Temp(Src) 98.2 F (36.8 C) (Oral)  Ht 5\' 6"  (1.676 m)  Wt 182 lb 4 oz (82.668 kg)  BMI 29.43 kg/m2  GEN: WDWN, NAD, Non-toxic, A & O x 3 HEENT: Atraumatic, Normocephalic. Neck supple. No masses, No LAD. Ears and Nose:  No external deformity. CV: RRR, No M/G/R. No JVD. No thrill. No extra heart sounds. PULM: CTA B, no wheezes, crackles, rhonchi. No retractions. No resp. distress. No accessory muscle use. EXTR: No c/c/e NEURO Normal gait.  PSYCH: Normally interactive. Conversant. Not depressed or anxious appearing.  Calm demeanor.   Laboratory and Imaging Data:  Assessment and Plan:   Generalized anxiety disorder  Panic disorder  Screening, lipid - Plan: Lipid panel  Screening for diabetes mellitus - Plan: Basic metabolic panel  Jittery - Plan: Vitamin B12  Other fatigue - Plan: Vitamin B12, CBC with Differential/Platelet, Hepatic function panel, VITAMIN D 25 Hydroxy (Vit-D Deficiency, Fractures), TSH  Other chest pain -  Plan: Vitamin B12  It sounds as if the patient had a major panic attack. She has had this worked up fairly extensively before. She hasn't had any laboratory work recently, so we will obtain this today.  Long conversation, and I think that she would likely be helped if she could stay on an SSRI our SNR I indefinitely. She is willing to resume her Prozac. Every other day x 2 weeks  Follow-up: 6 weeks  New Prescriptions   FLUOXETINE (PROZAC) 20 MG CAPSULE    Take 1 capsule (20 mg total) by mouth daily.   Orders Placed This Encounter  Procedures  . Lipid panel  . Vitamin B12  . Basic metabolic panel  . CBC with Differential/Platelet  . Hepatic function panel  . VITAMIN D 25 Hydroxy (Vit-D Deficiency, Fractures)  . TSH    Signed,  [name] T. Chantalle Defilippo, MD   Patient's Medications  New Prescriptions   FLUOXETINE (PROZAC) 20 MG CAPSULE    Take 1 capsule (20 mg total) by mouth daily.  Previous Medications   ALBUTEROL (PROVENTIL HFA;VENTOLIN HFA) 108 (90 BASE) MCG/ACT INHALER    Inhale 2 puffs into the lungs every 6 (six) hours as needed for wheezing or shortness of breath.   CETIRIZINE (ZYRTEC) 10 MG TABLET    Take 10 mg by mouth daily.   DIAZEPAM (VALIUM) 5 MG TABLET    TAKE 1 TABLET BY MOUTH THREE TIMES DAILY AS NEEDED FOR ANXIETY   ETANERCEPT (ENBREL SURECLICK) 50 MG/ML INJECTION    Inject 50 mg into the skin once a week.    LEVONORGESTREL (MIRENA) 20 MCG/24HR IUD    1 each by Intrauterine route once.   OMEPRAZOLE (PRILOSEC) 40 MG CAPSULE    Take 1 capsule (40 mg total) by mouth daily.   TRETINOIN (RETIN-A) 0.025 % CREAM    APP A PEA SIZED AMOUNT TO DRY FACE AT NIGHT  Modified Medications   No medications on file  Discontinued Medications   No medications on file

## 2016-02-14 ENCOUNTER — Encounter: Payer: Self-pay | Admitting: *Deleted

## 2016-02-15 ENCOUNTER — Ambulatory Visit (INDEPENDENT_AMBULATORY_CARE_PROVIDER_SITE_OTHER): Payer: Managed Care, Other (non HMO) | Admitting: Family Medicine

## 2016-02-15 ENCOUNTER — Encounter: Payer: Self-pay | Admitting: Family Medicine

## 2016-02-15 VITALS — BP 116/78 | HR 84 | Temp 98.0°F | Wt 183.5 lb

## 2016-02-15 DIAGNOSIS — J019 Acute sinusitis, unspecified: Secondary | ICD-10-CM

## 2016-02-15 MED ORDER — AMOXICILLIN-POT CLAVULANATE 875-125 MG PO TABS: 1.0000 | ORAL_TABLET | Freq: Two times a day (BID) | ORAL | Status: AC

## 2016-02-15 MED ORDER — FLUTICASONE PROPIONATE 50 MCG/ACT NA SUSP: 2.0000 | Freq: Every day | NASAL | Status: DC

## 2016-02-15 NOTE — Progress Notes (Signed)
Pre visit review using our clinic review tool, if applicable. No additional management support is needed unless otherwise documented below in the visit note. 

## 2016-02-15 NOTE — Patient Instructions (Addendum)
You have a sinus infection.  Push fluids and plenty of rest. Nasal saline irrigation or neti pot to help drain sinuses. May use plain mucinex with plenty of fluid to help mobilize mucous. Start flonase nasal steroid for sinus inflammation/congestion. If no better over next few days, or fever >101, worsening headcahe, or productive cough, fill antibiotic provided today.  Let us know if not better with above.

## 2016-02-15 NOTE — Progress Notes (Signed)
BP 116/78 mmHg  Pulse 84  Temp(Src) 98 F (36.7 C) (Oral)  Wt 183 lb 8 oz (83.235 kg)  SpO2 97%   CC: sinusitis?  Subjective:    Patient ID: Phyllis Myers, female    DOB: 12-06-1980, 35 y.o.   MRN: [id number]  HPI: Phyllis Myers is a 35 y.o. female presenting on 02/15/2016 for Sinusitis   5-6d h/o ear ache with congestion and muffled hearing, headache, productive cough, ST with PNDrainage. Low grade fever a few days ago. Head>chest congestion. Overall unchanged symptoms over last several days. Cough actually started 10 + days ago.   No fevers/chills, abd pain, dyspnea or wheezing.   Currently taking OTC remedies without relief. Co worker sick 1 wk ago. No smokers at home.  No h/o asthma.   On enbrel for reactive arthritis history. She skipped this week's dose.  Relevant past medical, surgical, family and social history reviewed and updated as indicated. Interim medical history since our last visit reviewed. Allergies and medications reviewed and updated. Current Outpatient Prescriptions on File Prior to Visit  Medication Sig  . albuterol (PROVENTIL HFA;VENTOLIN HFA) 108 (90 Base) MCG/ACT inhaler Inhale 2 puffs into the lungs every 6 (six) hours as needed for wheezing or shortness of breath.  . cetirizine (ZYRTEC) 10 MG tablet Take 10 mg by mouth daily.  . diazepam (VALIUM) 5 MG tablet TAKE 1 TABLET BY MOUTH THREE TIMES DAILY AS NEEDED FOR ANXIETY  . etanercept (ENBREL SURECLICK) 50 MG/ML injection Inject 50 mg into the skin once a week.   Marland Kitchen FLUoxetine (PROZAC) 20 MG capsule Take 1 capsule (20 mg total) by mouth daily.  Marland Kitchen levonorgestrel (MIRENA) 20 MCG/24HR IUD 1 each by Intrauterine route once.  Marland Kitchen omeprazole (PRILOSEC) 40 MG capsule Take 1 capsule (40 mg total) by mouth daily.  Marland Kitchen tretinoin (RETIN-A) 0.025 % cream APP A PEA SIZED AMOUNT TO DRY FACE AT NIGHT   No current facility-administered medications on file prior to visit.    Review of Systems Per HPI unless  specifically indicated in ROS section     Objective:    BP 116/78 mmHg  Pulse 84  Temp(Src) 98 F (36.7 C) (Oral)  Wt 183 lb 8 oz (83.235 kg)  SpO2 97%  Wt Readings from Last 3 Encounters:  02/15/16 183 lb 8 oz (83.235 kg)  02/08/16 182 lb 4 oz (82.668 kg)  12/02/15 192 lb (87.091 kg)    Physical Exam  Constitutional: She appears well-developed and well-nourished. No distress.  HENT:  Head: Normocephalic and atraumatic.  Right Ear: Hearing, tympanic membrane, external ear and ear canal normal.  Left Ear: Hearing, tympanic membrane, external ear and ear canal normal.  Nose: Mucosal edema present. No rhinorrhea. Right sinus exhibits no maxillary sinus tenderness and no frontal sinus tenderness. Left sinus exhibits no maxillary sinus tenderness and no frontal sinus tenderness.  Mouth/Throat: Uvula is midline and mucous membranes are normal. Posterior oropharyngeal edema and posterior oropharyngeal erythema present. No oropharyngeal exudate or tonsillar abscesses.  Evidently congested R>L nasal mucosal edema/inflammation  Eyes: Conjunctivae and EOM are normal. Pupils are equal, round, and reactive to light. No scleral icterus.  Neck: Normal range of motion. Neck supple.  Cardiovascular: Normal rate, regular rhythm, normal heart sounds and intact distal pulses.   No murmur heard. Pulmonary/Chest: Effort normal and breath sounds normal. No respiratory distress. She has no wheezes. She has no rales.  Lymphadenopathy:    She has no cervical adenopathy.  Skin: Skin is  warm and dry. No rash noted.  Nursing note and vitals reviewed.     Assessment & Plan:   Problem List Items Addressed This Visit    Acute sinusitis - Primary    Anticipate viral given short duration. Supportive care as per instructions - rec mucinex, flonase. Discussed reasons to fill abx and WASP for augmentin provided today. Pt agrees with plan.      Relevant Medications   amoxicillin-clavulanate (AUGMENTIN)  875-125 MG tablet   fluticasone (FLONASE) 50 MCG/ACT nasal spray       Follow up plan: Return if symptoms worsen or fail to improve.  Eustaquio Boyden, MD

## 2016-02-15 NOTE — Assessment & Plan Note (Signed)
Anticipate viral given short duration. Supportive care as per instructions - rec mucinex, flonase. Discussed reasons to fill abx and WASP for augmentin provided today. Pt agrees with plan.

## 2016-02-16 ENCOUNTER — Telehealth: Payer: Self-pay | Admitting: Family Medicine

## 2016-02-16 MED ORDER — GUAIFENESIN-CODEINE 100-10 MG/5ML PO SYRP: 5.0000 mL | ORAL_SOLUTION | Freq: Two times a day (BID) | ORAL | Status: DC | PRN

## 2016-02-16 NOTE — Telephone Encounter (Signed)
Pt called, cough is much worse today and is keeping her up at night, when she lays down she cant breathe.  Is asking if a cough medication can be called in for her to [facility] in [place].  Best number to call pt is [phone]

## 2016-02-16 NOTE — Telephone Encounter (Signed)
plz phone in codeine cough syrup and notify patient.

## 2016-02-17 ENCOUNTER — Other Ambulatory Visit: Payer: Self-pay | Admitting: *Deleted

## 2016-02-17 NOTE — Telephone Encounter (Signed)
Medication phoned to pharmacy.  

## 2016-02-28 ENCOUNTER — Telehealth: Payer: Self-pay

## 2016-02-28 MED ORDER — AZITHROMYCIN 250 MG PO TABS: ORAL_TABLET | ORAL | Status: DC

## 2016-02-28 NOTE — Telephone Encounter (Signed)
Patient notified

## 2016-02-28 NOTE — Telephone Encounter (Signed)
Treat with zpack antibiotic sent to pharmacy. Update [place] with condition after abx.

## 2016-02-28 NOTE — Telephone Encounter (Signed)
Pt left v/m; pt seen 02/15/16; pt has finished abx and pt can not see any improvement; still very congested and coughing same as when seen on 02/15/16; pt request cb. [facility].

## 2016-02-29 ENCOUNTER — Other Ambulatory Visit: Payer: Self-pay | Admitting: Family Medicine

## 2016-02-29 NOTE — Telephone Encounter (Signed)
Last office visit 02/15/2016 with Dr. Reece Agar.  Last refilled 08/10/2015 for #30 with 3 refills.  Ok to refill?

## 2016-02-29 NOTE — Telephone Encounter (Signed)
Diazepam called into [facility] in [place].

## 2016-02-29 NOTE — Telephone Encounter (Signed)
Ok to ref 30, 3 ref 

## 2016-03-21 ENCOUNTER — Ambulatory Visit: Payer: Managed Care, Other (non HMO) | Admitting: Family Medicine

## 2016-03-28 ENCOUNTER — Ambulatory Visit: Payer: Managed Care, Other (non HMO) | Admitting: Family Medicine

## 2016-05-07 ENCOUNTER — Ambulatory Visit (INDEPENDENT_AMBULATORY_CARE_PROVIDER_SITE_OTHER): Payer: Managed Care, Other (non HMO) | Admitting: Family Medicine

## 2016-05-07 ENCOUNTER — Encounter: Payer: Self-pay | Admitting: Family Medicine

## 2016-05-07 VITALS — BP 110/80 | HR 70 | Temp 98.5°F | Ht 66.0 in | Wt 176.0 lb

## 2016-05-07 DIAGNOSIS — F410 Panic disorder [episodic paroxysmal anxiety] without agoraphobia: Secondary | ICD-10-CM

## 2016-05-07 DIAGNOSIS — F411 Generalized anxiety disorder: Secondary | ICD-10-CM

## 2016-05-07 MED ORDER — SERTRALINE HCL 50 MG PO TABS: 50.0000 mg | ORAL_TABLET | Freq: Every day | ORAL | 5 refills | Status: DC

## 2016-05-07 NOTE — Progress Notes (Signed)
Pre visit review using our clinic review tool, if applicable. No additional management support is needed unless otherwise documented below in the visit note. 

## 2016-05-07 NOTE — Progress Notes (Signed)
   Dr. [name] T. Tiran Sauseda, MD, CAQ [facility] [address] [place], [address] Phone: [phone] Fax: [phone]  05/07/2016  Patient: Phyllis Myers, MRN: [id number], DOB: 06-14-1981, 35 y.o.  Primary Physician:  [name], MD   Chief Complaint  Patient presents with  . Follow-up    on anxiety   Subjective:   JAMMIE TROUP is a 35 y.o. very pleasant female patient who presents with the following:  >15 minutes spent in face to face time with patient, >50% spent in counselling or coordination of care   Stopped taking her prozac. X 1 month. Effexor Valium and xanax  [facility] [employer]  Darl Pikes.   She continues to have issues with primarily panic attacks.  She is having to take her Valium at least a half a tablet every day, up to 3/2 tablets at a maximum.  She stopped her Prozac 1 month ago.  She thought this may even be making her anxiety worse.  She has been doing some counseling, which is help.  She is not depressed currently.  She does have some family members who have responded well to Zoloft.  Panic disorder  Generalized anxiety disorder   Follow-up: Return in about 6 weeks (around 06/18/2016).  New Prescriptions   SERTRALINE (ZOLOFT) 50 MG TABLET    Take 1 tablet (50 mg total) by mouth daily.    Rickayla Wieland T. Chayce Rullo, MD   Patient's Medications  New Prescriptions   SERTRALINE (ZOLOFT) 50 MG TABLET    Take 1 tablet (50 mg total) by mouth daily.  Previous Medications   ALBUTEROL (PROVENTIL HFA;VENTOLIN HFA) 108 (90 BASE) MCG/ACT INHALER    Inhale 2 puffs into the lungs every 6 (six) hours as needed for wheezing or shortness of breath.   CETIRIZINE (ZYRTEC) 10 MG TABLET    Take 10 mg by mouth daily.   DIAZEPAM (VALIUM) 5 MG TABLET    TAKE 1 TABLET BY MOUTH THREE TIMES DAILY AS NEEDED FOR ANXIETY.   ETANERCEPT (ENBREL SURECLICK) 50 MG/ML INJECTION    Inject 50 mg into the skin once a week.    FLUOXETINE  (PROZAC) 20 MG CAPSULE    Take 1 capsule (20 mg total) by mouth daily.   FLUTICASONE (FLONASE) 50 MCG/ACT NASAL SPRAY    Place 2 sprays into both nostrils daily.   LEVONORGESTREL (MIRENA) 20 MCG/24HR IUD    1 each by Intrauterine route once.   OMEPRAZOLE (PRILOSEC) 40 MG CAPSULE    Take 1 capsule (40 mg total) by mouth daily.   TRETINOIN (RETIN-A) 0.025 % CREAM    APP A PEA SIZED AMOUNT TO DRY FACE AT NIGHT  Modified Medications   No medications on file  Discontinued Medications   AZITHROMYCIN (ZITHROMAX) 250 MG TABLET    Take two tablets on day one followed by one tablet on days 2-5   GUAIFENESIN-CODEINE (CHERATUSSIN AC) 100-10 MG/5ML SYRUP    Take 5 mLs by mouth 2 (two) times daily as needed for cough (sedation precautions).

## 2016-05-07 NOTE — Patient Instructions (Signed)
Take Zoloft 50 mg, 1/2 daily for 2 weeks, then increase to 1 whole tablet a day

## 2016-06-20 ENCOUNTER — Ambulatory Visit: Payer: Managed Care, Other (non HMO) | Admitting: Family Medicine

## 2016-06-27 ENCOUNTER — Telehealth: Payer: Self-pay | Admitting: Family Medicine

## 2016-06-27 ENCOUNTER — Encounter: Payer: Self-pay | Admitting: Emergency Medicine

## 2016-06-27 ENCOUNTER — Emergency Department
Admission: EM | Admit: 2016-06-27 | Discharge: 2016-06-27 | Disposition: A | Discharge status: Home / Self Care | Payer: Managed Care, Other (non HMO) | Attending: Emergency Medicine | Admitting: Emergency Medicine

## 2016-06-27 ENCOUNTER — Emergency Department: Payer: Managed Care, Other (non HMO)

## 2016-06-27 DIAGNOSIS — Y999 Unspecified external cause status: Secondary | ICD-10-CM | POA: Diagnosis not present

## 2016-06-27 DIAGNOSIS — S02401A Maxillary fracture, unspecified, initial encounter for closed fracture: Secondary | ICD-10-CM | POA: Insufficient documentation

## 2016-06-27 DIAGNOSIS — Y92520 Airport as the place of occurrence of the external cause: Secondary | ICD-10-CM | POA: Insufficient documentation

## 2016-06-27 DIAGNOSIS — W19XXXA Unspecified fall, initial encounter: Secondary | ICD-10-CM

## 2016-06-27 DIAGNOSIS — W1839XA Other fall on same level, initial encounter: Secondary | ICD-10-CM | POA: Insufficient documentation

## 2016-06-27 DIAGNOSIS — I10 Essential (primary) hypertension: Secondary | ICD-10-CM | POA: Insufficient documentation

## 2016-06-27 DIAGNOSIS — Y939 Activity, unspecified: Secondary | ICD-10-CM | POA: Diagnosis not present

## 2016-06-27 DIAGNOSIS — Z79899 Other long term (current) drug therapy: Secondary | ICD-10-CM | POA: Insufficient documentation

## 2016-06-27 DIAGNOSIS — S0083XA Contusion of other part of head, initial encounter: Secondary | ICD-10-CM

## 2016-06-27 DIAGNOSIS — S0990XA Unspecified injury of head, initial encounter: Secondary | ICD-10-CM

## 2016-06-27 MED ORDER — HYDROCODONE-ACETAMINOPHEN 5-325 MG PO TABS: 1.0000 | ORAL_TABLET | Freq: Four times a day (QID) | ORAL | 0 refills | Status: DC | PRN

## 2016-06-27 NOTE — Telephone Encounter (Signed)
°  Patient Name: Phyllis Myers  Gender: Female  DOB: [birth year]   Age: 35 Y 11 M 13 D  Return Phone Number: [phone] (Primary), [phone] (Secondary)  Address:   City/State/Zip: [place]    Client [facility] Day - Client  Client [facility] - Day  Physician [name], [name] - MD  Contact Type Call  Who Is Calling Patient / Member / Family / Caregiver  Call Type Triage / Clinical  Relationship To Patient Self  Return Phone Number [phone] (Primary)  Chief Complaint EYE - struck or hit in the eye  Reason for Call Symptomatic / Request for Health Information  Initial Comment Caller took a hard fall- Eyes are bruised and black- worried nose is broken-- She has an appt but was transdfreered to see if she needs to be seen sooner.   Appointment Disposition EMR Patient Reports Appointment Already Scheduled  Info pasted into Epic Yes  PreDisposition Call Doctor  Translation No   Nurse Assessment  Nurse: [name], RN, [name] Date/Time [name] Time): 06/27/2016 10:55:41 AM  Confirm and document reason for call. If symptomatic, describe symptoms. You must click the next button to save text entered. ---Caller states she took a hard fall 2 days ago. Both eyes are bruised and black and she is concerned she has a broken nose. She did have a nosebleed after the fall., and is having a hard time breathing through her left nostril. She has an appt but was calling to see if she needs to be seen sooner.  Has the patient traveled out of the country within the last 30 days? ---Not Applicable  Does the patient have any new or worsening symptoms? ---Yes  Will a triage be completed? ---Yes  Related visit to physician within the last 2 weeks? ---No  Does the PT have any chronic conditions? (i.e. diabetes, asthma, etc.) ---Yes  List chronic conditions. ---Hx cough/wheezing  Is the patient pregnant or possibly pregnant? (Ask all females between the ages of  80-55) ---No  Is this a behavioral health or substance abuse call? ---No     Guidelines      Guideline Title Affirmed Question Affirmed Notes Nurse Date/Time (Eastern Time)  Head Injury One or two "black eyes" (bruising, purple color of eyelids)  [name] RN, [name] 06/27/2016 10:58:50 AM   Disp. Time [name] Time) Disposition Final User   06/27/2016 10:51:33 AM Send to Urgent Queue  [name]    06/27/2016 11:00:53 AM Go to ED Now Yes [name], RN, Lynita Lombard Understands: Yes  Disagree/Comply: Comply     Care Advice Given Per Guideline
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
## 2016-06-27 NOTE — Telephone Encounter (Signed)
Per chart review tab pt at [facility] ED now.

## 2016-06-27 NOTE — ED Notes (Signed)
Pt fell this weekend on hardwood floors, denies LOC. Bruising and swelling noted to bridge of nose and underneath both eyes. States worried nose is broken. Applied ice at home.

## 2016-06-27 NOTE — Telephone Encounter (Signed)
I think that is reasonable.

## 2016-09-14 ENCOUNTER — Other Ambulatory Visit: Payer: Self-pay | Admitting: Family Medicine

## 2016-09-14 NOTE — Telephone Encounter (Signed)
Last office visit 05/07/2016.  Last refilled 02/29/2016 for #30 with 3 refills. Ok to refill?

## 2016-09-14 NOTE — Telephone Encounter (Signed)
Diazepam called into [facility] [id number] - [place], [place] - [address] AT [facility] [address] [facility] Phone: [phone]

## 2016-09-14 NOTE — Telephone Encounter (Signed)
Ok to refill #30, 1 ref 

## 2016-10-30 ENCOUNTER — Ambulatory Visit (INDEPENDENT_AMBULATORY_CARE_PROVIDER_SITE_OTHER): Payer: Managed Care, Other (non HMO) | Admitting: Primary Care

## 2016-10-30 ENCOUNTER — Encounter: Payer: Self-pay | Admitting: Primary Care

## 2016-10-30 DIAGNOSIS — K219 Gastro-esophageal reflux disease without esophagitis: Secondary | ICD-10-CM

## 2016-10-30 NOTE — Progress Notes (Signed)
Subjective:    Patient ID: Phyllis Myers, female    DOB: [birth year], 36 y.o.   MRN: [id number]  HPI  Ms. Phyllis Myers is a 36 year old female who presents today with a chief complaint of fever. She also reports sore throat, nasal congestion, ear pain, mild cough. Her fevers are running 101 both times she's checked. Fevers began yesterday. Her symptoms began 2-3 days ago. She's taken Nyquil without much improvement. She has not taken any tylenol or ibuprofen. She is worried about her symptoms as she has a history of pneumonia.  She has noticed her cough intermittently for months. She does have a history of GERD and was advised to start omeprazole for which she has not done so. She does experience esophageal burning and epigastric discomfort.  Review of Systems  Constitutional: Positive for chills, fatigue and fever.  HENT: Positive for congestion and sinus pressure.   Respiratory: Positive for cough. Negative for shortness of breath.   Cardiovascular: Negative for chest pain.       Past Medical History:  Diagnosis Date  . Arthritis   . Hypertension      Social History   Social History  . Marital status: Single    Spouse name: N/A  . Number of children: N/A  . Years of education: N/A   Occupational History  . Not on file.   Social History Main Topics  . Smoking status: Never Smoker  . Smokeless tobacco: Never Used  . Alcohol use 2.0 oz/week    4 Standard drinks or equivalent per week     Comment: occ  . Drug use: No  . Sexual activity: Yes    Birth control/ protection: Condom   Other Topics Concern  . Not on file   Social History Narrative   Married, no kids.    No past surgical history on file.  Family History  Problem Relation Age of Onset  . Pulmonary Hypertension Mother   . Cardiomyopathy Mother   . Heart disease Mother   . Heart disease Father 81    MI  . Heart disease Maternal Grandfather   . Heart disease Paternal Grandfather     Allergies    Allergen Reactions  . Amoxapine And Related Rash    GI distrubances    Current Outpatient Prescriptions on File Prior to Visit  Medication Sig Dispense Refill  . albuterol (PROVENTIL HFA;VENTOLIN HFA) 108 (90 Base) MCG/ACT inhaler Inhale 2 puffs into the lungs every 6 (six) hours as needed for wheezing or shortness of breath. 1 Inhaler 3  . cetirizine (ZYRTEC) 10 MG tablet Take 10 mg by mouth daily.    . diazepam (VALIUM) 5 MG tablet TAKE 1 TABLET BY MOUTH THREE TIMES DAILY AS NEEDED 30 tablet 1  . etanercept (ENBREL SURECLICK) 50 MG/ML injection Inject 50 mg into the skin once a week.     Marland Kitchen FLUoxetine (PROZAC) 20 MG capsule Take 1 capsule (20 mg total) by mouth daily. 30 capsule 5  . fluticasone (FLONASE) 50 MCG/ACT nasal spray Place 2 sprays into both nostrils daily. (Patient taking differently: Place 2 sprays into both nostrils daily as needed. ) 16 g 1  . HYDROcodone-acetaminophen (NORCO) 5-325 MG tablet Take 1 tablet by mouth every 6 (six) hours as needed for severe pain. 10 tablet 0  . levonorgestrel (MIRENA) 20 MCG/24HR IUD 1 each by Intrauterine route once.    Marland Kitchen omeprazole (PRILOSEC) 40 MG capsule Take 1 capsule (40 mg total) by mouth daily. 30  capsule 1  . sertraline (ZOLOFT) 50 MG tablet Take 1 tablet (50 mg total) by mouth daily. 30 tablet 5  . tretinoin (RETIN-A) 0.025 % cream APP A PEA SIZED AMOUNT TO DRY FACE AT NIGHT  3   No current facility-administered medications on file prior to visit.     BP 120/86   Pulse 70   Temp 98.4 F (36.9 C) (Oral)   Ht 5' (1.524 m)   Wt 170 lb 12.8 oz (77.5 kg)   SpO2 98%   BMI 33.36 kg/m    Objective:   Physical Exam  Constitutional: She appears well-nourished. She does not appear ill.  HENT:  Right Ear: Tympanic membrane and ear canal normal.  Left Ear: Tympanic membrane and ear canal normal.  Nose: Right sinus exhibits no maxillary sinus tenderness and no frontal sinus tenderness. Left sinus exhibits no maxillary sinus  tenderness and no frontal sinus tenderness.  Mouth/Throat: Oropharynx is clear and moist.  Eyes: Conjunctivae are normal.  Neck: Neck supple.  Cardiovascular: Normal rate and regular rhythm.   Pulmonary/Chest: Effort normal and breath sounds normal. She has no wheezes. She has no rales.  Lymphadenopathy:    She has no cervical adenopathy.  Skin: Skin is warm and dry.          Assessment & Plan:  URI:  Cough, fevers, chills, body aches x 24 hours. No fever in clinic today, has not taken antipyretics. Exam without evidence of bacterial involvement. Clear lungs, does not appear ill. Suspect viral illness and will treat with supportive measures. Delsym, tylenol/ibuprofen PRN, fluids, rest.  Morrie Sheldon, NP

## 2016-10-30 NOTE — Assessment & Plan Note (Signed)
Suspect this to be cause of chronic cough. Have her resume PPI as she has this Rx at home. Will take for 4 weeks, follow up if no improvement.

## 2016-10-30 NOTE — Patient Instructions (Signed)
Your symptoms are representative of a viral illness which will resolve on its own over time. Our goal is to treat your symptoms in order to aid your body in the healing process and to make you more comfortable.   Cough: Try taking Delsym, this may be purchased over the counter.  Fevers, body aches, chills, difficulty sleeping: Try tylenol or advil PM.  Please notify me if you develop persistent fevers of 101, start coughing up green mucous, notice increased fatigue or weakness, or feel worse after 1 week of onset of symptoms.   Increase consumption of water intake and rest.  Start omeprazole tablets as discussed for acid reflux as this is likely contributing to your cough. Take this for at least 3-4 weeks.  It was a pleasure meeting you!   Upper Respiratory Infection, Adult Most upper respiratory infections (URIs) are a viral infection of the air passages leading to the lungs. A URI affects the nose, throat, and upper air passages. The most common type of URI is nasopharyngitis and is typically referred to as "the common cold." URIs run their course and usually go away on their own. Most of the time, a URI does not require medical attention, but sometimes a bacterial infection in the upper airways can follow a viral infection. This is called a secondary infection. Sinus and middle ear infections are common types of secondary upper respiratory infections. Bacterial pneumonia can also complicate a URI. A URI can worsen asthma and chronic obstructive pulmonary disease (COPD). Sometimes, these complications can require emergency medical care and may be life threatening. What are the causes? Almost all URIs are caused by viruses. A virus is a type of germ and can spread from one person to another. What increases the risk? You may be at risk for a URI if:  You smoke.  You have chronic heart or lung disease.  You have a weakened defense (immune) system.  You are very young or very old.  You  have nasal allergies or asthma.  You work in crowded or poorly ventilated areas.  You work in health care facilities or schools. What are the signs or symptoms? Symptoms typically develop 2-3 days after you come in contact with a cold virus. Most viral URIs last 7-10 days. However, viral URIs from the influenza virus (flu virus) can last 14-18 days and are typically more severe. Symptoms may include:  Runny or stuffy (congested) nose.  Sneezing.  Cough.  Sore throat.  Headache.  Fatigue.  Fever.  Loss of appetite.  Pain in your forehead, behind your eyes, and over your cheekbones (sinus pain).  Muscle aches. How is this diagnosed? Your health care provider may diagnose a URI by:  Physical exam.  Tests to check that your symptoms are not due to another condition such as:  Strep throat.  Sinusitis.  Pneumonia.  Asthma. How is this treated? A URI goes away on its own with time. It cannot be cured with medicines, but medicines may be prescribed or recommended to relieve symptoms. Medicines may help:  Reduce your fever.  Reduce your cough.  Relieve nasal congestion. Follow these instructions at home:  Take medicines only as directed by your health care provider.  Gargle warm saltwater or take cough drops to comfort your throat as directed by your health care provider.  Use a warm mist humidifier or inhale steam from a shower to increase air moisture. This may make it easier to breathe.  Drink enough fluid to keep your urine  clear or pale yellow.  Eat soups and other clear broths and maintain good nutrition.  Rest as needed.  Return to work when your temperature has returned to normal or as your health care provider advises. You may need to stay home longer to avoid infecting others. You can also use a face mask and careful hand washing to prevent spread of the virus.  Increase the usage of your inhaler if you have asthma.  Do not use any tobacco products,  including cigarettes, chewing tobacco, or electronic cigarettes. If you need help quitting, ask your health care provider. How is this prevented? The best way to protect yourself from getting a cold is to practice good hygiene.  Avoid oral or hand contact with people with cold symptoms.  Wash your hands often if contact occurs. There is no clear evidence that vitamin C, vitamin E, echinacea, or exercise reduces the chance of developing a cold. However, it is always recommended to get plenty of rest, exercise, and practice good nutrition. Contact a health care provider if:  You are getting worse rather than better.  Your symptoms are not controlled by medicine.  You have chills.  You have worsening shortness of breath.  You have brown or red mucus.  You have yellow or brown nasal discharge.  You have pain in your face, especially when you bend forward.  You have a fever.  You have swollen neck glands.  You have pain while swallowing.  You have white areas in the back of your throat. Get help right away if:  You have severe or persistent:  Headache.  Ear pain.  Sinus pain.  Chest pain.  You have chronic lung disease and any of the following:  Wheezing.  Prolonged cough.  Coughing up blood.  A change in your usual mucus.  You have a stiff neck.  You have changes in your:  Vision.  Hearing.  Thinking.  Mood. This information is not intended to replace advice given to you by your health care provider. Make sure you discuss any questions you have with your health care provider. Document Released: 03/13/2001 Document Revised: 05/20/2016 Document Reviewed: 12/23/2013 Elsevier Interactive Patient Education  2017 [employer].

## 2016-10-30 NOTE — Progress Notes (Signed)
Pre visit review using our clinic review tool, if applicable. No additional management support is needed unless otherwise documented below in the visit note. 

## 2016-10-31 ENCOUNTER — Telehealth: Payer: Self-pay

## 2016-10-31 NOTE — Telephone Encounter (Signed)
Pt left v/m; pt was seen 10/30/16; last night fever was 101.something. Pt had a lot of sweating last night. Now has pain under rt breast in rib cage when takes deep breath or coughs. Pt request cb with what to do next. [facility]. Please advise.

## 2016-10-31 NOTE — Telephone Encounter (Signed)
Please notify patient that these symptoms are typical of influenza or a viral like illness. Her lungs were clear and did not suggest pneumonia or other bacterial process.  I recommend plenty of fluids, rest, tylenol or ibuprofen for aches. Please have her follow up in the clinic if she's no better by Monday next week.

## 2016-11-01 NOTE — Telephone Encounter (Signed)
Please have her take 1000 mg of tylenol every 8 hours OR 800 mg of ibuprofen every 8 hours for fevers. Please have her call Monday next week if her fevers persist despite tylenol/ibuprofen.

## 2016-11-01 NOTE — Telephone Encounter (Signed)
Patient called.  I let her know Kate's comments.  Patient said her fever last night was 101.5 and she was told to call if her fever went up.  Patient wants to know what she should do for the fever.Patient said she's still having rib pain.

## 2016-11-02 NOTE — Telephone Encounter (Signed)
Spoken and notified patient of Kate's comments. Patient verbalized understanding. 

## 2016-11-05 ENCOUNTER — Other Ambulatory Visit: Payer: Self-pay | Admitting: Family Medicine

## 2016-11-05 NOTE — Telephone Encounter (Signed)
Last office visit 10/30/2016 with Allayne Gitelman.  Last refilled 09/14/2016 for #30 with 1 refill. Ok to refill?

## 2016-11-06 NOTE — Telephone Encounter (Signed)
Message left for patient to return my call.  

## 2016-11-06 NOTE — Telephone Encounter (Signed)
Ok to refill.  #30, 1 refill 

## 2016-11-06 NOTE — Telephone Encounter (Signed)
Diazepam called into [id number] - [place], [place] - [address] Phone: [phone]  

## 2016-11-07 NOTE — Telephone Encounter (Signed)
Noted. Sounds like she may have some inflammation/irritation to the lining of her chest wall. Aleve or ibuprofen will help with inflammation. Sometimes this may last up to 2 weeks.

## 2016-11-07 NOTE — Telephone Encounter (Signed)
Patient called back. She stated no fever is gone. Patient stated that she is still sore on the right side of the rib cage. It is bothersome when she takes a deep breathe.

## 2016-11-08 NOTE — Telephone Encounter (Signed)
Spoken and notified patient of Kate's comments. Patient verbalized understanding. 

## 2016-12-19 ENCOUNTER — Encounter: Payer: Self-pay | Admitting: Family Medicine

## 2016-12-19 ENCOUNTER — Ambulatory Visit (INDEPENDENT_AMBULATORY_CARE_PROVIDER_SITE_OTHER): Payer: 59 | Admitting: Family Medicine

## 2016-12-19 ENCOUNTER — Other Ambulatory Visit: Payer: Self-pay | Admitting: *Deleted

## 2016-12-19 VITALS — BP 102/80 | HR 89 | Temp 98.4°F | Ht 60.0 in | Wt 171.2 lb

## 2016-12-19 DIAGNOSIS — F411 Generalized anxiety disorder: Secondary | ICD-10-CM | POA: Diagnosis not present

## 2016-12-19 DIAGNOSIS — F410 Panic disorder [episodic paroxysmal anxiety] without agoraphobia: Secondary | ICD-10-CM | POA: Diagnosis not present

## 2016-12-19 DIAGNOSIS — F320 Major depressive disorder, single episode, mild: Secondary | ICD-10-CM | POA: Diagnosis not present

## 2016-12-19 MED ORDER — OMEPRAZOLE 40 MG PO CPDR: 40.0000 mg | DELAYED_RELEASE_CAPSULE | Freq: Every day | ORAL | 1 refills | Status: DC

## 2016-12-19 MED ORDER — ESCITALOPRAM OXALATE 10 MG PO TABS: 10.0000 mg | ORAL_TABLET | Freq: Every day | ORAL | 2 refills | Status: DC

## 2016-12-19 NOTE — Progress Notes (Signed)
Dr. [name] T. Malicia Blasdel, MD, CAQ Sports Medicine Primary [facility] and Sports Medicine [address] [place], [address] Phone: [phone] Fax: [phone]  12/19/2016  Patient: Phyllis Myers, MRN: [id number], DOB: 28-May-1981, 36 y.o.  Primary Physician:  [name], MD   Chief Complaint  Patient presents with  . Follow-up    Zoloft   Subjective:   Phyllis Myers is a 36 y.o. very pleasant female patient who presents with the following:  Stopped her zoloft last week.  Has had some depression.  Does not want to do anything.   Tried some prozac and effexor and never tried the [employer].   Laying in the bed for hours.  Sleeping ok No guilt   Past Medical History, Surgical History, Social History, Family History, Problem List, Medications, and Allergies have been reviewed and updated if relevant.  Patient Active Problem List   Diagnosis Date Noted  . 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 Panic disorder 07/21/2014    Priority: Medium  . Generalized anxiety disorder 09/08/2013    Priority: Medium  . Acute sinusitis 02/15/2016  . GERD (gastroesophageal reflux disease) 12/02/2015  . Insomnia 11/26/2014  . Allergic rhinitis 07/21/2014  . Family history of early CAD 06/09/2013  . Common migraine 01/27/2013  . HTN (hypertension) 01/27/2013  . Hyperlipidemia, borderline 01/27/2013  . Hx of abnormal Pap smear 01/27/2013    Past Medical History:  Diagnosis Date  . Arthritis   . Hypertension     No past surgical history on file.  Social History   Social History  . Marital status: Single    Spouse name: N/A  . Number of children: N/A  . Years of education: N/A   Occupational History  . Not on file.   Social History Main Topics  . Smoking status: Never Smoker  . Smokeless tobacco: Never Used  . Alcohol use 2.0 oz/week    4 Standard drinks or equivalent per week     Comment: occ  . Drug use: No  . Sexual activity: Yes   Birth control/ protection: Condom   Other Topics Concern  . Not on file   Social History Narrative   Married, no kids.    Family History  Problem Relation Age of Onset  . Pulmonary Hypertension Mother   . Cardiomyopathy Mother   . Heart disease Mother   . Heart disease Father 35    MI  . Heart disease Maternal Grandfather   . Heart disease Paternal Grandfather     Allergies  Allergen Reactions  . Amoxicillin     Muscle pain    Medication list reviewed and updated in full in [facility].   GEN: No acute illnesses, no fevers, chills. GI: No n/v/d, eating normally Pulm: No SOB Interactive and getting along well at home.  Otherwise, ROS is as per the HPI.  Objective:   BP 102/80   Pulse 89   Temp 98.4 F (36.9 C) (Oral)   Ht 5' (1.524 m)   Wt 171 lb 4 oz (77.7 kg)   BMI 33.44 kg/m   GEN: WDWN, NAD, Non-toxic, A & O x 3 HEENT: Atraumatic, Normocephalic. Neck supple. No masses, No LAD. Ears and Nose: No external deformity. EXTR: No c/c/e NEURO Normal gait.  PSYCH: Normally interactive. Conversant. Not depressed or anxious appearing.  Calm demeanor.   Laboratory and Imaging Data:  Assessment and Plan:   Panic disorder  Generalized anxiety disorder  Depression,  major, single episode, mild (HCC)  Panic attacks have improved.  She still does have some minor anxiety.  Over the last 6 months or so patient has had some depressive symptoms also to.  She thinks this may have calm from her Zoloft.  Confounding factors include the deaths of 2 grandparents.  She has anhedonia as primary symptom.  Discontinue Zoloft and start Lexapro 10 mg with close follow-up.  P.r.n. Valium use continues.  Follow-up: Return in about 6 weeks (around 01/30/2017).  Meds ordered this encounter  Medications  . escitalopram (LEXAPRO) 10 MG tablet    Sig: Take 1 tablet (10 mg total) by mouth daily.    Dispense:  30 tablet    Refill:  2   Medications Discontinued During This  Encounter  Medication Reason  . FLUoxetine (PROZAC) 20 MG capsule Change in therapy  . sertraline (ZOLOFT) 50 MG tablet    Signed,  Phyllis Simkin T. Bethene Hankinson, MD   Allergies as of 12/19/2016      Reactions   Amoxicillin    Muscle pain      Medication List       Accurate as of 12/19/16 11:59 PM. Always use your most recent med list.          albuterol 108 (90 Base) MCG/ACT inhaler Commonly known as:  PROVENTIL HFA;VENTOLIN HFA Inhale 2 puffs into the lungs every 6 (six) hours as needed for wheezing or shortness of breath.   cetirizine 10 MG tablet Commonly known as:  ZYRTEC Take 10 mg by mouth daily.   diazepam 5 MG tablet Commonly known as:  VALIUM TAKE 1 TABLET BY MOUTH THREE TIMES DAILY AS NEEDED   ENBREL SURECLICK 50 MG/ML injection Generic drug:  etanercept Inject 50 mg into the skin once a week.   escitalopram 10 MG tablet Commonly known as:  LEXAPRO Take 1 tablet (10 mg total) by mouth daily.   fluticasone 50 MCG/ACT nasal spray Commonly known as:  FLONASE Place 2 sprays into both nostrils daily.   HYDROcodone-acetaminophen 5-325 MG tablet Commonly known as:  NORCO Take 1 tablet by mouth every 6 (six) hours as needed for severe pain.   levonorgestrel 20 MCG/24HR IUD Commonly known as:  MIRENA 1 each by Intrauterine route once.   omeprazole 40 MG capsule Commonly known as:  PRILOSEC Take 1 capsule (40 mg total) by mouth daily.   tretinoin 0.025 % cream Commonly known as:  RETIN-A APP A PEA SIZED AMOUNT TO DRY FACE AT NIGHT

## 2016-12-19 NOTE — Progress Notes (Signed)
Pre visit review using our clinic review tool, if applicable. No additional management support is needed unless otherwise documented below in the visit note. 

## 2016-12-20 ENCOUNTER — Encounter: Payer: Self-pay | Admitting: Family Medicine

## 2016-12-20 DIAGNOSIS — F320 Major depressive disorder, single episode, mild: Secondary | ICD-10-CM

## 2016-12-20 HISTORY — DX: Major depressive disorder, single episode, mild: F32.0

## 2016-12-26 ENCOUNTER — Other Ambulatory Visit: Payer: Self-pay | Admitting: Family Medicine

## 2017-01-28 ENCOUNTER — Other Ambulatory Visit: Payer: Self-pay | Admitting: Family Medicine

## 2017-01-29 NOTE — Telephone Encounter (Signed)
Last office visit 12/19/2016.  Last refilled 11/06/2016 for #30 with 1 refill.  Ok to refill?

## 2017-01-29 NOTE — Telephone Encounter (Signed)
Ok, 30, 2 refills 

## 2017-01-29 NOTE — Telephone Encounter (Signed)
Diazepam called into [id number] - [place], [place] - [address] Phone: [phone]  

## 2017-01-30 ENCOUNTER — Ambulatory Visit (INDEPENDENT_AMBULATORY_CARE_PROVIDER_SITE_OTHER): Payer: 59 | Admitting: Family Medicine

## 2017-01-30 ENCOUNTER — Encounter: Payer: Self-pay | Admitting: Family Medicine

## 2017-01-30 VITALS — BP 114/80 | HR 70 | Temp 98.6°F | Ht 60.0 in | Wt 173.2 lb

## 2017-01-30 DIAGNOSIS — F411 Generalized anxiety disorder: Secondary | ICD-10-CM | POA: Diagnosis not present

## 2017-01-30 DIAGNOSIS — F410 Panic disorder [episodic paroxysmal anxiety] without agoraphobia: Secondary | ICD-10-CM

## 2017-01-30 DIAGNOSIS — F320 Major depressive disorder, single episode, mild: Secondary | ICD-10-CM

## 2017-01-30 NOTE — Progress Notes (Signed)
Pre visit review using our clinic review tool, if applicable. No additional management support is needed unless otherwise documented below in the visit note. 

## 2017-01-30 NOTE — Progress Notes (Signed)
   Dr. [name] T. Bennette Hasty, MD, CAQ Sports Medicine Primary Care and Sports Medicine [address] [place], [address] Phone: [phone] Fax: [phone]  01/30/2017  Patient: Phyllis Myers, MRN: [id number], DOB: Oct 25, 1980, 36 y.o.  Primary Physician:  [name], MD   Chief Complaint  Patient presents with  . Follow-up    Panic disorder   Subjective:   Phyllis Myers is a 36 y.o. very pleasant female patient who presents with the following:  f/u panic and dep:   Panic attacks less. No major attacks.  Anhedonia   >15 minutes spent in face to face time with patient, >50% spent in counselling or coordination of care   She feels much better switching from Zoloft to Lexapro.  Her anhedonia is improved and she feels like being active and doing things with her friends now.  She is not having any significant down spells or crying episodes.  She has had some occasional panic attacks, but they're much better compared to how they were before.  She is occasionally taking some Valium one half a tablet to one full tablet a day.  Overall, she feels like she is much better compared to before.  Panic disorder  Generalized anxiety disorder  Depression, major, single episode, mild (HCC)  Signed,  Azir Muzyka T. Nadia Viar, MD   Allergies as of 01/30/2017      Reactions   Amoxicillin    Muscle pain      Medication List       Accurate as of 01/30/17 11:59 PM. Always use your most recent med list.          cetirizine 10 MG tablet Commonly known as:  ZYRTEC Take 10 mg by mouth daily.   diazepam 5 MG tablet Commonly known as:  VALIUM TAKE 1 TABLET BY MOUTH THREE TIMES DAILY AS NEEDED   ENBREL SURECLICK 50 MG/ML injection Generic drug:  etanercept Inject 50 mg into the skin once a week.   escitalopram 10 MG tablet Commonly known as:  LEXAPRO Take 1 tablet (10 mg total) by mouth daily.   fluticasone 50 MCG/ACT nasal spray Commonly known as:  FLONASE Place 2  sprays into both nostrils daily.   HYDROcodone-acetaminophen 5-325 MG tablet Commonly known as:  NORCO Take 1 tablet by mouth every 6 (six) hours as needed for severe pain.   levonorgestrel 20 MCG/24HR IUD Commonly known as:  MIRENA 1 each by Intrauterine route once.   omeprazole 40 MG capsule Commonly known as:  PRILOSEC Take 1 capsule (40 mg total) by mouth daily.   PROAIR HFA 108 (90 Base) MCG/ACT inhaler Generic drug:  albuterol INHALE 2 PUFFS INTO THE LUNGS EVERY 6 HOURS AS NEEDED FOR WHEEZING OR SHORTNESS OF BREATH   tretinoin 0.025 % cream Commonly known as:  RETIN-A APP A PEA SIZED AMOUNT TO DRY FACE AT NIGHT

## 2017-02-15 ENCOUNTER — Emergency Department: Payer: 59

## 2017-02-15 ENCOUNTER — Encounter: Payer: Self-pay | Admitting: Emergency Medicine

## 2017-02-15 ENCOUNTER — Emergency Department
Admission: EM | Admit: 2017-02-15 | Discharge: 2017-02-15 | Disposition: A | Discharge status: Home / Self Care | Payer: 59 | Attending: Emergency Medicine | Admitting: Emergency Medicine

## 2017-02-15 ENCOUNTER — Telehealth: Payer: Self-pay

## 2017-02-15 ENCOUNTER — Telehealth: Payer: Self-pay | Admitting: Emergency Medicine

## 2017-02-15 DIAGNOSIS — I10 Essential (primary) hypertension: Secondary | ICD-10-CM | POA: Insufficient documentation

## 2017-02-15 DIAGNOSIS — R079 Chest pain, unspecified: Secondary | ICD-10-CM | POA: Diagnosis present

## 2017-02-15 DIAGNOSIS — Z5321 Procedure and treatment not carried out due to patient leaving prior to being seen by health care provider: Secondary | ICD-10-CM | POA: Diagnosis not present

## 2017-02-15 LAB — BASIC METABOLIC PANEL
Anion gap: 13 (ref 5–15)
BUN: 9 mg/dL (ref 6–20)
CO2: 23 mmol/L (ref 22–32)
CREATININE: 0.58 mg/dL (ref 0.44–1.00)
Calcium: 9.2 mg/dL (ref 8.9–10.3)
Chloride: 102 mmol/L (ref 101–111)
GFR calc Af Amer: >60 mL/min (ref >60)
GLUCOSE: 108 mg/dL — AB (ref 65–99)
POTASSIUM: 3.4 mmol/L — AB (ref 3.5–5.1)
Sodium: 138 mmol/L (ref 135–145)

## 2017-02-15 LAB — TROPONIN I: Troponin I: <0.03 ng/mL (ref <0.03)

## 2017-02-15 LAB — CBC
HEMATOCRIT: 43.1 % (ref 35.0–47.0)
Hemoglobin: 15.5 g/dL (ref 12.0–16.0)
MCH: 35.7 pg — ABNORMAL HIGH (ref 26.0–34.0)
MCHC: 36 g/dL (ref 32.0–36.0)
MCV: 99.1 fL (ref 80.0–100.0)
PLATELETS: 192 10*3/uL (ref 150–440)
RBC: 4.35 MIL/uL (ref 3.80–5.20)
RDW: 12.3 % (ref 11.5–14.5)
WBC: 8.6 10*3/uL (ref 3.6–11.0)

## 2017-02-15 NOTE — Telephone Encounter (Signed)
Pt left v/m requesting cb with lab and xray results from being seen in [facility] ED on 02/15/17. Pt has already scheduled 30' appt with Dr Ermalene Searing on 02/19/17 at 3:30. When I looked back at [facility] ED record appears pt left without being seen.Please advise.

## 2017-02-15 NOTE — ED Triage Notes (Signed)
Pt ambulatory to triage in NAD, report generalized chest pain radiating to back, and shooting pains down legs.  Pt report hx of anxiety and family hx of heart problems.

## 2017-02-15 NOTE — Telephone Encounter (Signed)
Called patient due to lwot to inquire about condition and follow up plans. Says she has already made appt for Tuesday with pcp.  I told her to let them know that labs were done here and her sx.  I told her that she should return here if she gets worse in the interim.

## 2017-02-15 NOTE — Telephone Encounter (Signed)
Notify pt that CXR was normal. Labs normal except slightly elevated sugar ( likely nonfasting so normal) and potassium slightly low at 3.4.

## 2017-02-18 NOTE — Telephone Encounter (Signed)
Cyndi notified as instructed by telephone.  She is scheduled for ED follow up tomorrow 02/19/2017 at 3:30 pm with Dr. Ermalene Searing.

## 2017-02-18 NOTE — Telephone Encounter (Addendum)
Returned patient's phone call.  Mailbox is full and unable to accept any phone calls at this time.

## 2017-02-19 ENCOUNTER — Encounter: Payer: Self-pay | Admitting: Family Medicine

## 2017-02-19 ENCOUNTER — Ambulatory Visit (INDEPENDENT_AMBULATORY_CARE_PROVIDER_SITE_OTHER): Payer: 59 | Admitting: Family Medicine

## 2017-02-19 DIAGNOSIS — F411 Generalized anxiety disorder: Secondary | ICD-10-CM

## 2017-02-19 DIAGNOSIS — R42 Dizziness and giddiness: Secondary | ICD-10-CM | POA: Diagnosis not present

## 2017-02-19 DIAGNOSIS — R0789 Other chest pain: Secondary | ICD-10-CM | POA: Diagnosis not present

## 2017-02-19 NOTE — Assessment & Plan Note (Signed)
Moderate control.. Offered increase of lexapro to help decrease use of valium pt not interested at this time.  Will follow up with PCP in next 2-3 months for re-eval.

## 2017-02-19 NOTE — Assessment & Plan Note (Signed)
Intermittant and recurrent for > 1 year.  Past work up with labs negative.  No clear med SE  not consistent with vertigo.  She wishes to discus with card whether cardiac source possible and further eval needed? Such as holter monitor.  May also be anxiety and panic related.

## 2017-02-19 NOTE — Assessment & Plan Note (Signed)
No clearly cardiac but  EKG change compared to 2015. Much more consistent with anxiety or MSK cause of pain.  Pt very concerned about cardiac source of symptoms. She will return to Dr. Kirke Corin for further eval and treatment.

## 2017-02-19 NOTE — Progress Notes (Signed)
Subjective:    Patient ID: Phyllis Myers, female    DOB: 10/20/1980, 36 y.o.   MRN: [id number]  HPI    36 year old female presents  For follow up on [facility] ED visit.  She was seen on  5/18 for  Acute chest pain  EKG:  NSR,  Q wave suggesting anterior infarct, unknown time but not present at 2015 EKG  BMET, cbc and trop I neg  CXR: clear  She left AMA given wait.   Today she reports:  She went to ER for  sharpleft sided/central chest pain, expanded to bilateral lower chest. occured rest. Lasted seconds. Has continued to have  Soreness at lower rib cage unitl today. No associated SOB.  No relationship with eating.   She has been having intermittent dizzy spells, presyncopal, blurry vision.   Started on anxiety med.. lexapro in last 2 months.. Has improved anxiety some.  Still using valium for panic attacks, 1/2-1 tabs daily.  no SE to lexapro.   CV risk:  Family history of CAD. MGm CHF  mom CHF father MI age 60s Nonsmoker  Good cholesterol  Nml stress test and ECHO nml in 2014  Saw Dr. Kirke Corin     Review of Systems  Constitutional: Positive for fatigue. Negative for fever.  HENT: Negative for ear pain.   Eyes: Negative for pain.  Respiratory: Negative for chest tightness and shortness of breath.   Cardiovascular: Positive for chest pain. Negative for palpitations and leg swelling.  Gastrointestinal: Negative for abdominal pain.  Genitourinary: Negative for dysuria.  Neurological: Positive for syncope and light-headedness.       Objective:   Physical Exam  Constitutional: Vital signs are normal. She appears well-developed and well-nourished. She is cooperative.  Non-toxic appearance. She does not appear ill. No distress.  HENT:  Head: Normocephalic.  Right Ear: Hearing, tympanic membrane, external ear and ear canal normal. Tympanic membrane is not erythematous, not retracted and not bulging.  Left Ear: Hearing, tympanic membrane, external ear and ear canal normal.  Tympanic membrane is not erythematous, not retracted and not bulging.  Nose: No mucosal edema or rhinorrhea. Right sinus exhibits no maxillary sinus tenderness and no frontal sinus tenderness. Left sinus exhibits no maxillary sinus tenderness and no frontal sinus tenderness.  Mouth/Throat: Uvula is midline, oropharynx is clear and moist and mucous membranes are normal.  Eyes: Conjunctivae, EOM and lids are normal. Pupils are equal, round, and reactive to light. Lids are everted and swept, no foreign bodies found.  Neck: Trachea normal and normal range of motion. Neck supple. Carotid bruit is not present. No thyroid mass and no thyromegaly present.  Cardiovascular: Normal rate, regular rhythm, S1 normal, S2 normal, normal heart sounds, intact distal pulses and normal pulses.  Exam reveals no gallop and no friction rub.   No murmur heard. Pulmonary/Chest: Effort normal and breath sounds normal. No tachypnea. No respiratory distress. She has no decreased breath sounds. She has no wheezes. She has no rhonchi. She has no rales.  ttp over lower rib cage and left constocondral junction  Abdominal: Soft. Normal appearance and bowel sounds are normal. There is no tenderness.  Neurological: She is alert.  Skin: Skin is warm, dry and intact. No rash noted.  Psychiatric: Her speech is normal and behavior is normal. Judgment and thought content normal. Her mood appears not anxious. Cognition and memory are normal. She does not exhibit a depressed mood.          Assessment &  Plan:

## 2017-02-19 NOTE — Patient Instructions (Addendum)
If interested call for appt with Dr. Kirke Corin to review EKG and eval dizziness.  Call if interested in increase in lexapro to 20 mg daily.  Follow up with Dr. Patsy Lager 2-3 months.

## 2017-04-16 ENCOUNTER — Encounter: Payer: Self-pay | Admitting: Cardiovascular Disease

## 2017-04-16 ENCOUNTER — Ambulatory Visit (INDEPENDENT_AMBULATORY_CARE_PROVIDER_SITE_OTHER): Payer: 59 | Admitting: Cardiovascular Disease

## 2017-04-16 VITALS — BP 118/80 | HR 76 | Ht 65.0 in | Wt 180.5 lb

## 2017-04-16 DIAGNOSIS — R079 Chest pain, unspecified: Secondary | ICD-10-CM

## 2017-04-16 DIAGNOSIS — R42 Dizziness and giddiness: Secondary | ICD-10-CM

## 2017-04-16 NOTE — Patient Instructions (Addendum)
Medication Instructions:  Your physician recommends that you continue on your current medications as directed. Please refer to the Current Medication list given to you today.   Labwork: none  Testing/Procedures: Your physician has requested that you have an exercise tolerance test. For further information please visit [email or web address] Please also follow instruction sheet, as given.  Please bring your inhaler and wear shoes appropriate for walking on a treadmill (sneakers)    Follow-Up: Your physician recommends that you schedule a follow-up appointment as needed.    Any Other Special Instructions Will Be Listed Below (If Applicable).     If you need a refill on your cardiac medications before your next appointment, please call your pharmacy.   Exercise Stress Electrocardiogram An exercise stress electrocardiogram is a test to check how blood flows to your heart. It is done to find areas of poor blood flow. You will need to walk on a treadmill for this test. The electrocardiogram will record your heartbeat when you are at rest and when you are exercising. What happens before the procedure?  Do not have drinks with caffeine or foods with caffeine for 24 hours before the test, or as told by your doctor. This includes coffee, tea (even decaf tea), sodas, chocolate, and cocoa.  Follow your doctor's instructions about eating and drinking before the test.  Ask your doctor what medicines you should or should not take before the test. Take your medicines with water unless told by your doctor not to.  If you use an inhaler, bring it with you to the test.  Bring a snack to eat after the test.  Do not  smoke for 4 hours before the test.  Do not put lotions, powders, creams, or oils on your chest before the test.  Wear comfortable shoes and clothing. What happens during the procedure?  You will have patches put on your chest. Small areas of your chest may need to be shaved.  Wires will be connected to the patches.  Your heart rate will be watched while you are resting and while you are exercising.  You will walk on the treadmill. The treadmill will slowly get faster to raise your heart rate.  The test will take about 1-2 hours. What happens after the procedure?  Your heart rate and blood pressure will be watched after the test.  You may return to your normal diet, activities, and medicines or as told by your doctor. This information is not intended to replace advice given to you by your health care provider. Make sure you discuss any questions you have with your health care provider. Document Released: 03/05/2008 Document Revised: 05/16/2016 Document Reviewed: 05/25/2013 Elsevier Interactive Patient Education  [employer].

## 2017-04-16 NOTE — Progress Notes (Signed)
Cardiology Office Note   Date:  04/16/2017   ID:  Phyllis Myers, DOB [birth year], MRN [id number]  PCP:  [name], MD  Cardiologist:   Lorine Bears, MD   Chief Complaint  Patient presents with  . other    Ref by Dr. Patsy Lager for dizziness and chest pain. Meds reviewed by the pt. verbally. Pt. c/o chest pain for several months, dizzines and shortness of breath.       History of Present Illness: Phyllis Myers is a 36 y.o. female who Was referred by Dr. Patsy Lager for evaluation of chest pain and dizziness. She was seen by me in 2014 for similar symptoms. She underwent a treadmill stress test which was normal. Echocardiogram showed normal LV systolic function with no valvular abnormalities and no evidence of pulmonary hypertension. Heart the monitor showed no significant arrhythmia.  Unfortunately, her husband died 3 years ago with heart disease and since then she has been extremely stressed and anxious. She reports intermittent episodes of chest pain described as dull aching discomfort lasting for a few minutes and happening at rest. It does not happen with physical activities. She also complains of frequent dizziness and presyncope without full loss of consciousness. This has been associated with occasional palpitations but no tachycardia.  She has known family history of coronary artery disease. Her father died in his 72s of myocardial infarction. Mother died in her 74s from congestive heart failure and pulmonary hypertension. She is not a smoker.    Past Medical History:  Diagnosis Date  . Arthritis   . Depression, major, single episode, mild (HCC) 12/20/2016  . Hypertension     History reviewed. No pertinent surgical history.   Current Outpatient Prescriptions  Medication Sig Dispense Refill  . cetirizine (ZYRTEC) 10 MG tablet Take 10 mg by mouth daily.    . diazepam (VALIUM) 5 MG tablet TAKE 1 TABLET BY MOUTH THREE TIMES DAILY AS NEEDED 30 tablet 2  .  escitalopram (LEXAPRO) 10 MG tablet Take 1 tablet (10 mg total) by mouth daily. 30 tablet 2  . etanercept (ENBREL SURECLICK) 50 MG/ML injection Inject 50 mg into the skin once a week.     . fluticasone (FLONASE) 50 MCG/ACT nasal spray Place 2 sprays into both nostrils daily as needed for allergies or rhinitis.    Marland Kitchen HYDROcodone-acetaminophen (NORCO) 5-325 MG tablet Take 1 tablet by mouth every 6 (six) hours as needed for severe pain. 10 tablet 0  . levonorgestrel (MIRENA) 20 MCG/24HR IUD 1 each by Intrauterine route once.    Marland Kitchen omeprazole (PRILOSEC) 40 MG capsule Take 1 capsule (40 mg total) by mouth daily. 90 capsule 1  . PROAIR HFA 108 (90 Base) MCG/ACT inhaler INHALE 2 PUFFS INTO THE LUNGS EVERY 6 HOURS AS NEEDED FOR WHEEZING OR SHORTNESS OF BREATH 8.5 g 2  . tretinoin (RETIN-A) 0.025 % cream APP A PEA SIZED AMOUNT TO DRY FACE AT NIGHT  3   No current facility-administered medications for this visit.     Allergies:   Amoxicillin    Social History:  The patient  reports that she has never smoked. She has never used smokeless tobacco. She reports that she drinks about 2.0 oz of alcohol per week . She reports that she does not use drugs.   Family History:  The patient's family history includes Cardiomyopathy in her mother; Heart disease in her maternal grandfather, mother, and paternal grandfather; Heart disease (age of onset: 10) in her father; Pulmonary Hypertension in  her mother.    ROS:  Please see the history of present illness.   Otherwise, review of systems are positive for none.   All other systems are reviewed and negative.    PHYSICAL EXAM: VS:  BP 118/80 (BP Location: Right Arm, Patient Position: Sitting, Cuff Size: Normal)   Pulse 76   Ht 5\' 5"  (1.651 m)   Wt 180 lb 8 oz (81.9 kg)   BMI 30.04 kg/m  , BMI Body mass index is 30.04 kg/m. GEN: Well nourished, well developed, in no acute distress  HEENT: normal  Neck: no JVD, carotid bruits, or masses Cardiac: RRR; no  rubs, or  gallops,no edema . One out of 6 systolic ejection murmur at the aortic area Respiratory:  clear to auscultation bilaterally, normal work of breathing GI: soft, nontender, nondistended, + BS MS: no deformity or atrophy  Skin: warm and dry, no rash Neuro:  Strength and sensation are intact Psych: euthymic mood, full affect   EKG:  EKG is ordered today. The ekg ordered today demonstrates normal sinus rhythm with sinus arrhythmia and no significant ST or T wave changes.   Recent Labs: 02/15/2017: BUN 9; Creatinine, Ser 0.58; Hemoglobin 15.5; Platelets 192; Potassium 3.4; Sodium 138    Lipid Panel    Component Value Date/Time   CHOL 201 (H) 02/08/2016 1254   TRIG 82.0 02/08/2016 1254   HDL 84.00 02/08/2016 1254   CHOLHDL 2 02/08/2016 1254   VLDL 16.4 02/08/2016 1254   LDLCALC 101 (H) 02/08/2016 1254   LDLDIRECT 150.1 06/08/2013 0809      Wt Readings from Last 3 Encounters:  04/16/17 180 lb 8 oz (81.9 kg)  02/19/17 173 lb 8 oz (78.7 kg)  02/15/17 173 lb (78.5 kg)        PAD Screen 04/16/2017  Previous PAD dx? No  Previous surgical procedure? No  Pain with walking? No  Feet/toe relief with dangling? No  Painful, non-healing ulcers? No  Extremities discolored? No      ASSESSMENT AND PLAN:  1.  Atypical chest pain: Her symptoms are overall atypical and nonexertional. She did smoke for about a year after her husband died but otherwise no significant risk factors other than family history. I requested a treadmill stress test for evaluation.  I discussed with the patient the importance of lifestyle changes in order to decrease the chance of future coronary artery disease and cardiovascular events. We discussed the importance of controlling risk factors, healthy diet as well as regular exercise. I also explained to him that a normal stress test does not rule out atherosclerosis.   I explained to her that if stress test comes back normal as expected, it is very reasonable to  evaluate for coronary atherosclerosis with CT calcium scoring in few years while she is about 38.  2. Dizziness: I doubt that this is due to arrhythmia. She is not orthostatic today. I think the yield of Holter monitor is very low in her situation.    Disposition:   FU with me as needed.   Signed,  04/18/2017, MD  04/16/2017 2:40 PM    [facility]

## 2017-04-24 LAB — HM PAP SMEAR: HM Pap smear: NEGATIVE

## 2017-04-26 ENCOUNTER — Telehealth: Payer: Self-pay | Admitting: Cardiovascular Disease

## 2017-04-26 NOTE — Telephone Encounter (Signed)
Reviewed instructions, appointment time, and location with patient and she verbalized understanding with no further questions at this time.

## 2017-04-29 ENCOUNTER — Ambulatory Visit (INDEPENDENT_AMBULATORY_CARE_PROVIDER_SITE_OTHER): Payer: 59

## 2017-04-29 DIAGNOSIS — R079 Chest pain, unspecified: Secondary | ICD-10-CM | POA: Diagnosis not present

## 2017-05-09 LAB — EXERCISE TOLERANCE TEST
CHL CUP MPHR: 183 {beats}/min
Estimated workload: 10.1 METS
Exercise duration (min): 8 min
Exercise duration (sec): 42 s
Peak HR: 169 {beats}/min
Percent HR: 92 %
Rest HR: 71 {beats}/min

## 2017-05-27 ENCOUNTER — Other Ambulatory Visit: Payer: Self-pay | Admitting: Family Medicine

## 2017-05-27 NOTE — Telephone Encounter (Signed)
Ok to ref, #30, 2 ref 

## 2017-05-27 NOTE — Telephone Encounter (Signed)
Last office visit 02/19/2017 with Dr. Ermalene Searing for CP.  Last refilled 01/29/2017 for #30 with 2 refills.  Ok to refill?

## 2017-05-27 NOTE — Telephone Encounter (Signed)
Diazepam called into [facility] [id number] - [place], [place] - [address] AT [facility] OF [address] [facility] Phone: [phone]

## 2017-06-28 ENCOUNTER — Other Ambulatory Visit: Payer: Self-pay | Admitting: Family Medicine

## 2017-09-02 ENCOUNTER — Other Ambulatory Visit: Payer: Self-pay | Admitting: Family Medicine

## 2017-09-02 NOTE — Telephone Encounter (Signed)
Diazepam called into [id number] - [place], [place] - [address] AT [facility] OF [address] [facility]

## 2017-09-02 NOTE — Telephone Encounter (Signed)
Ok, 30, 2 ref 

## 2017-09-02 NOTE — Telephone Encounter (Signed)
Last office visit 02/19/2017.  Last refilled 05/27/2017 for #30 with 2 refills.  Ok to refill?

## 2017-10-19 ENCOUNTER — Other Ambulatory Visit: Payer: Self-pay | Admitting: Family Medicine

## 2017-12-24 ENCOUNTER — Other Ambulatory Visit: Payer: Self-pay | Admitting: Family Medicine

## 2017-12-24 NOTE — Telephone Encounter (Signed)
Last office visit 02/19/2017.  Last refilled 09/02/2017 for #30 with 2 refills.  Ok to refill?

## 2017-12-24 NOTE — Telephone Encounter (Signed)
Can you set her up for a CPX sometime in the next couple of months? °

## 2017-12-25 NOTE — Telephone Encounter (Signed)
Left message on home number asking pt to call office

## 2017-12-25 NOTE — Telephone Encounter (Signed)
Can you set her up for a CPX sometime in the next couple of months?

## 2017-12-30 ENCOUNTER — Other Ambulatory Visit: Payer: Self-pay | Admitting: Family Medicine

## 2018-02-13 ENCOUNTER — Other Ambulatory Visit (INDEPENDENT_AMBULATORY_CARE_PROVIDER_SITE_OTHER): Payer: 59

## 2018-02-13 ENCOUNTER — Other Ambulatory Visit: Payer: Self-pay | Admitting: Family Medicine

## 2018-02-13 DIAGNOSIS — Z0000 Encounter for general adult medical examination without abnormal findings: Secondary | ICD-10-CM | POA: Diagnosis not present

## 2018-02-13 LAB — BASIC METABOLIC PANEL
BUN: 11 mg/dL (ref 6–23)
CHLORIDE: 105 meq/L (ref 96–112)
CO2: 24 mEq/L (ref 19–32)
Calcium: 9.1 mg/dL (ref 8.4–10.5)
Creatinine, Ser: 0.62 mg/dL (ref 0.40–1.20)
GFR: 114.14 mL/min (ref >60.00)
GLUCOSE: 87 mg/dL (ref 70–99)
POTASSIUM: 3.9 meq/L (ref 3.5–5.1)
Sodium: 141 mEq/L (ref 135–145)

## 2018-02-13 LAB — CBC WITH DIFFERENTIAL/PLATELET
BASOS PCT: 1 % (ref 0.0–3.0)
Basophils Absolute: 0 10*3/uL (ref 0.0–0.1)
EOS ABS: 0.1 10*3/uL (ref 0.0–0.7)
Eosinophils Relative: 3.1 % (ref 0.0–5.0)
HCT: 41.8 % (ref 36.0–46.0)
HEMOGLOBIN: 14.8 g/dL (ref 12.0–15.0)
LYMPHS ABS: 1.5 10*3/uL (ref 0.7–4.0)
Lymphocytes Relative: 34.3 % (ref 12.0–46.0)
MCHC: 35.5 g/dL (ref 30.0–36.0)
MCV: 100.4 fl — ABNORMAL HIGH (ref 78.0–100.0)
Monocytes Absolute: 0.4 10*3/uL (ref 0.1–1.0)
Monocytes Relative: 10.3 % (ref 3.0–12.0)
NEUTROS PCT: 51.3 % (ref 43.0–77.0)
Neutro Abs: 2.2 10*3/uL (ref 1.4–7.7)
Platelets: 266 10*3/uL (ref 150.0–400.0)
RBC: 4.16 Mil/uL (ref 3.87–5.11)
RDW: 12.1 % (ref 11.5–15.5)
WBC: 4.3 10*3/uL (ref 4.0–10.5)

## 2018-02-13 LAB — LIPID PANEL
CHOLESTEROL: 173 mg/dL (ref 0–200)
HDL: 55.2 mg/dL (ref >39.00)
LDL CALC: 80 mg/dL (ref 0–99)
NonHDL: 118.03
TRIGLYCERIDES: 191 mg/dL — AB (ref 0.0–149.0)
Total CHOL/HDL Ratio: 3
VLDL: 38.2 mg/dL (ref 0.0–40.0)

## 2018-02-13 LAB — HEPATIC FUNCTION PANEL
ALBUMIN: 4.2 g/dL (ref 3.5–5.2)
ALT: 24 U/L (ref 0–35)
AST: 26 U/L (ref 0–37)
Alkaline Phosphatase: 48 U/L (ref 39–117)
Bilirubin, Direct: 0.2 mg/dL (ref 0.0–0.3)
Total Bilirubin: 0.8 mg/dL (ref 0.2–1.2)
Total Protein: 7 g/dL (ref 6.0–8.3)

## 2018-02-13 LAB — TSH: TSH: 4.13 u[IU]/mL (ref 0.35–4.50)

## 2018-02-17 ENCOUNTER — Encounter: Payer: Self-pay | Admitting: Family Medicine

## 2018-02-17 ENCOUNTER — Ambulatory Visit (INDEPENDENT_AMBULATORY_CARE_PROVIDER_SITE_OTHER): Payer: 59 | Admitting: Family Medicine

## 2018-02-17 ENCOUNTER — Other Ambulatory Visit: Payer: Self-pay

## 2018-02-17 VITALS — BP 100/70 | HR 71 | Temp 98.8°F | Ht 64.75 in | Wt 181.5 lb

## 2018-02-17 DIAGNOSIS — Z0000 Encounter for general adult medical examination without abnormal findings: Secondary | ICD-10-CM

## 2018-02-17 NOTE — Progress Notes (Signed)
Dr. Frederico Hamman T. Kaled Allende, MD, [facility] [place], [address] Phone: [phone] Fax: [phone]  02/17/2018  Patient: Phyllis Myers, MRN: [id number], DOB: 1980-10-10, 37 y.o.  Primary Physician:  Owens Loffler, MD   Chief Complaint  Patient presents with  . Annual Exam   Subjective:   Phyllis Myers is a 37 y.o. pleasant patient who presents with the following:  Health Maintenance Summary Reviewed and updated, unless pt declines services.  Tobacco History Reviewed. Non-smoker Alcohol: No concerns, no excessive use Exercise Habits: Some activity, rec at least 30 mins 5 times a week STD concerns: none Drug Use: None Birth control method: Menses regular: yes Lumps or breast concerns: no Breast Cancer Family History: no  Health Maintenance  Topic Date Due  . HIV Screening  07/14/1994  . TETANUS/TDAP  07/14/1998  . PAP SMEAR  11/17/2017  . INFLUENZA VACCINE  05/01/2018   Anxiety is doing much better  There is no immunization history on file for this patient. Patient Active Problem List   Diagnosis Date Noted  . 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 Panic disorder 07/21/2014    Priority: Medium  . Generalized anxiety disorder 09/08/2013    Priority: Medium  . Depression, major, single episode, mild ([place]) 12/20/2016  . GERD (gastroesophageal reflux disease) 12/02/2015  . Insomnia 11/26/2014  . Allergic rhinitis 07/21/2014  . Family history of early CAD 06/09/2013  . Common migraine 01/27/2013  . HTN (hypertension) 01/27/2013  . Hyperlipidemia, borderline 01/27/2013  . Hx of abnormal Pap smear 01/27/2013   Past Medical History:  Diagnosis Date  . Arthritis   . Depression, major, single episode, mild ([place]) 12/20/2016  . Hypertension    No past surgical history on file. Social History   Socioeconomic History  . Marital status: Single    Spouse name: Not on file  .  Number of children: Not on file  . Years of education: Not on file  . Highest education level: Not on file  Occupational History  . Not on file  Social Needs  . Financial resource strain: Not on file  . Food insecurity:    Worry: Not on file    Inability: Not on file  . Transportation needs:    Medical: Not on file    Non-medical: Not on file  Tobacco Use  . Smoking status: Never Smoker  . Smokeless tobacco: Never Used  Substance and Sexual Activity  . Alcohol use: Yes    Alcohol/week: 2.0 oz    Types: 4 Standard drinks or equivalent per week    Comment: occ  . Drug use: No  . Sexual activity: Yes    Birth control/protection: Condom  Lifestyle  . Physical activity:    Days per week: Not on file    Minutes per session: Not on file  . Stress: Not on file  Relationships  . Social connections:    Talks on phone: Not on file    Gets together: Not on file    Attends religious service: Not on file    Active member of club or organization: Not on file    Attends meetings of clubs or organizations: Not on file    Relationship status: Not on file  . Intimate partner violence:    Fear of current or ex partner: Not on file    Emotionally abused: Not on file    Physically abused: Not on  file    Forced sexual activity: Not on file  Other Topics Concern  . Not on file  Social History Narrative   Married, no kids.   Family History  Problem Relation Age of Onset  . Pulmonary Hypertension Mother   . Cardiomyopathy Mother   . Heart disease Mother   . Heart disease Father 70       MI  . Heart disease Maternal Grandfather   . Heart disease Paternal Grandfather    Allergies  Allergen Reactions  . Amoxicillin     Muscle pain    Medication list has been reviewed and updated.   General: Denies fever, chills, sweats. No significant weight loss. Eyes: Denies blurring,significant itching ENT: Denies earache, sore throat, and hoarseness.  Cardiovascular: Denies chest pains,  palpitations, dyspnea on exertion,  Respiratory: Denies cough, dyspnea at rest,wheeezing Breast: no concerns about lumps GI: Denies nausea, vomiting, diarrhea, constipation, change in bowel habits, abdominal pain, melena, hematochezia GU: Denies dysuria, hematuria, urinary hesitancy, nocturia, denies STD risk, no concerns about discharge Musculoskeletal: Denies back pain, joint pain Derm: Denies rash, itching Neuro: Denies  paresthesias, frequent falls, frequent headaches Psych: Denies depression, anxiety Endocrine: Denies cold intolerance, heat intolerance, polydipsia Heme: Denies enlarged lymph nodes Allergy: No hayfever  Objective:   BP 100/70   Pulse 71   Temp 98.8 F (37.1 C) (Oral)   Ht 5' 4.75" (1.645 m)   Wt 181 lb 8 oz (82.3 kg)   BMI 30.44 kg/m  No exam data present  GEN: well developed, well nourished, no acute distress Eyes: conjunctiva and lids normal, PERRLA, EOMI ENT: TM clear, nares clear, oral exam WNL Neck: supple, no lymphadenopathy, no thyromegaly, no JVD Pulm: clear to auscultation and percussion, respiratory effort normal CV: regular rate and rhythm, S1-S2, no murmur, rub or gallop, no bruits Chest: no scars, masses, no lumps BREAST: breast exam declined GI: soft, non-tender; no hepatosplenomegaly, masses; active bowel sounds all quadrants GU: GU exam declined Lymph: no cervical, axillary or inguinal adenopathy MSK: gait normal, muscle tone and strength WNL, no joint swelling, effusions, discoloration, crepitus  SKIN: clear, good turgor, color WNL, no rashes, lesions, or ulcerations Neuro: normal mental status, normal strength, sensation, and motion Psych: alert; oriented to person, place and time, normally interactive and not anxious or depressed in appearance.   All labs reviewed with patient. Lipids:    Component Value Date/Time   CHOL 173 02/13/2018 0859   TRIG 191.0 (H) 02/13/2018 0859   HDL 55.20 02/13/2018 0859   LDLDIRECT 150.1 06/08/2013  0809   VLDL 38.2 02/13/2018 0859   CHOLHDL 3 02/13/2018 0859   CBC: CBC Latest Ref Rng & Units 02/13/2018 02/15/2017 02/08/2016  WBC 4.0 - 10.5 K/uL 4.3 8.6 9.0  Hemoglobin 12.0 - 15.0 g/dL 14.8 15.5 16.1(H)  Hematocrit 36.0 - 46.0 % 41.8 43.1 46.0  Platelets 150.0 - 400.0 K/uL 266.0 192 425.9    Basic Metabolic Panel:    Component Value Date/Time   NA 141 02/13/2018 0859   NA 137 10/23/2012 2040   K 3.9 02/13/2018 0859   K 3.3 (L) 10/23/2012 2040   CL 105 02/13/2018 0859   CL 105 10/23/2012 2040   CO2 24 02/13/2018 0859   CO2 22 10/23/2012 2040   BUN 11 02/13/2018 0859   BUN 14 10/23/2012 2040   CREATININE 0.62 02/13/2018 0859   CREATININE 0.64 10/23/2012 2040   GLUCOSE 87 02/13/2018 0859   GLUCOSE 127 (H) 10/23/2012 2040   CALCIUM 9.1  02/13/2018 0859   CALCIUM 8.8 10/23/2012 2040   Hepatic Function Latest Ref Rng & Units 02/13/2018 02/08/2016 06/08/2013  Total Protein 6.0 - 8.3 g/dL 7.0 7.5 7.1  Albumin 3.5 - 5.2 g/dL 4.2 4.9 4.0  AST 0 - 37 U/L 26 32 19  ALT 0 - 35 U/L 24 33 33  Alk Phosphatase 39 - 117 U/L 48 46 54  Total Bilirubin 0.2 - 1.2 mg/dL 0.8 1.0 0.4  Bilirubin, Direct 0.0 - 0.3 mg/dL 0.2 0.2 -    Lab Results  Component Value Date   TSH 4.13 02/13/2018   No results found.  Assessment and Plan:   Routine general medical examination at a health care facility  Health Maintenance Exam: The patient's preventative maintenance and recommended screening tests for an annual wellness exam were reviewed in full today. Brought up to date unless services declined.  Counselled on the importance of diet, exercise, and its role in overall health and mortality. The patient's FH and SH was reviewed, including their home life, tobacco status, and drug and alcohol status.  Follow-up in 1 year for physical exam or additional follow-up below.  Follow-up: No follow-ups on file. Or follow-up in 1 year if not noted.  Signed,  Maud Deed. Jaythan Hinely, MD   Allergies as of  02/17/2018      Reactions   Amoxicillin    Muscle pain      Medication List        Accurate as of 02/17/18  2:32 PM. Always use your most recent med list.          albuterol 108 (90 Base) MCG/ACT inhaler Commonly known as:  PROVENTIL HFA;VENTOLIN HFA INHALE 2 PUFFS INTO THE LUNGS EVERY 6 HOURS AS NEEDED FOR WHEEZING OR SHORTNESS OF BREATH   diazepam 5 MG tablet Commonly known as:  VALIUM TAKE 1 TABLET BY MOUTH THREE TIMES DAILY AS NEEDED   ENBREL SURECLICK 50 MG/ML injection Generic drug:  etanercept Inject 50 mg into the skin once a week.   HYDROcodone-acetaminophen 5-325 MG tablet Commonly known as:  NORCO Take 1 tablet by mouth every 6 (six) hours as needed for severe pain.   levonorgestrel 20 MCG/24HR IUD Commonly known as:  MIRENA 1 each by Intrauterine route once.   omeprazole 40 MG capsule Commonly known as:  PRILOSEC TAKE 1 CAPSULE(40 MG) BY MOUTH DAILY

## 2018-02-20 ENCOUNTER — Encounter: Payer: Self-pay | Admitting: Family Medicine

## 2018-07-10 ENCOUNTER — Other Ambulatory Visit: Payer: Self-pay | Admitting: Family Medicine

## 2018-09-11 ENCOUNTER — Other Ambulatory Visit: Payer: Self-pay | Admitting: Family Medicine

## 2018-09-11 NOTE — Telephone Encounter (Signed)
Last office visit 02/17/2018 for CPE.  Last refilled 12/24/2017 for #30 with no refills.  No future appointments.

## 2018-11-28 ENCOUNTER — Other Ambulatory Visit: Payer: Self-pay | Admitting: Family Medicine

## 2018-11-28 NOTE — Telephone Encounter (Signed)
Last office visit 02/17/2018 for CPE.  Last refilled 09/11/2018 for #30 with no refills.  No future appointments.

## 2018-12-26 ENCOUNTER — Other Ambulatory Visit: Payer: Self-pay | Admitting: Family Medicine

## 2019-01-19 ENCOUNTER — Other Ambulatory Visit: Payer: Self-pay | Admitting: Rheumatology

## 2019-01-19 DIAGNOSIS — M25562 Pain in left knee: Secondary | ICD-10-CM

## 2019-01-26 ENCOUNTER — Other Ambulatory Visit: Payer: Self-pay

## 2019-01-26 ENCOUNTER — Ambulatory Visit
Admission: RE | Admit: 2019-01-26 | Discharge: 2019-01-26 | Disposition: A | Discharge status: Home / Self Care | Payer: 59 | Source: Ambulatory Visit | Attending: Rheumatology | Admitting: Rheumatology

## 2019-01-26 DIAGNOSIS — M25562 Pain in left knee: Secondary | ICD-10-CM | POA: Insufficient documentation

## 2019-02-13 ENCOUNTER — Other Ambulatory Visit: Payer: Self-pay

## 2019-02-14 DIAGNOSIS — Z148 Genetic carrier of other disease: Secondary | ICD-10-CM | POA: Insufficient documentation

## 2019-02-14 NOTE — Progress Notes (Signed)
[facility]  Telephone:([phone] [phone]  HEMATOLOGY-ONCOLOGY TELEMEDICINE VISIT PROGRESS NOTE  ID: Phyllis Myers OB: 09/15/1981  MR#: [id number]  AOZ#:[id number]#:[id number]  Patient Care Team: Hannah Beatopland, Spencer, MD as PCP - General (Family Medicine)  I connected with@ on 02/18/19 at 11:15 AM EDT by video enabled telemedicine visit and verified that I am speaking with the correct person using two identifiers.   I discussed the limitations, risks, security and privacy concerns of performing an evaluation and management service by telemedicine and the availability of in-person appointments. I also discussed with the patient that there may be a patient responsible charge related to this service. The patient expressed understanding and agreed to proceed.  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  INTERVAL HISTORY: Patient is a 38 year old female who was noted to have elevated liver enzymes on work-up for treatment for an underlying rheumatologic disorder.  Further evaluation revealed elevated iron stores and a single mutation for hemochromatosis.  She is chronic joint pain, but otherwise feels well.  She has no neurologic complaints.  She denies any recent fevers or illnesses.  She has a good appetite and denies weight loss.  She has no chest pain, shortness of breath, cough, or hemoptysis.  She has no nausea, vomiting, constipation, or diarrhea.  She has no urinary complaints.  Patient offers no further specific complaints today. REVIEW OF SYSTEMS:   Review of Systems  Constitutional: Negative.  Negative for fever, malaise/fatigue and weight loss.  Respiratory: Negative.  Negative for cough, hemoptysis and shortness of breath.   Cardiovascular: Negative.  Negative for chest pain and leg  swelling.  Gastrointestinal: Negative.  Negative for abdominal pain.  Genitourinary: Negative.  Negative for dysuria.  Musculoskeletal: Positive for joint pain.  Skin: Negative.  Negative for rash.  Neurological: Negative.  Negative for dizziness, focal weakness and headaches.  Psychiatric/Behavioral: The patient is nervous/anxious.     As per HPI. Otherwise, a complete review of systems is negative.  PAST MEDICAL HISTORY: Past Medical History:  Diagnosis Date  . Arthritis   . Depression, major, single episode, mild (HCC) 12/20/2016  . Hypertension     PAST SURGICAL HISTORY: History reviewed. No pertinent surgical history.  FAMILY HISTORY: Family History  Problem Relation Age of Onset  . Pulmonary Hypertension Mother   . Cardiomyopathy Mother   . Heart disease Mother   . Heart disease Father 1050       MI  . Heart disease Maternal Grandfather   . Heart disease Paternal Grandfather     ADVANCED DIRECTIVES (Y/N):  N  HEALTH MAINTENANCE: Social History   Tobacco Use  . Smoking status: Never Smoker  . Smokeless tobacco: Never Used  Substance Use Topics  . Alcohol use: Yes    Alcohol/week: 4.0 standard drinks    Types: 4 Standard drinks or equivalent per week    Comment: occ  . Drug use: No     Colonoscopy:  PAP:  Bone density:  Lipid panel:  Allergies  Allergen Reactions  . Amoxicillin     Muscle pain  . Sulfasalazine     Other reaction(s): Headache  . Naproxen Sodium Rash    Current Outpatient Medications  Medication Sig Dispense Refill  . diazepam (VALIUM) 5 MG tablet TAKE 1 TABLET BY MOUTH THREE TIMES DAILY AS NEEDED 30 tablet 2  . etodolac (  LODINE) 200 MG capsule Take 200 mg by mouth 2 (two) times daily.    Marland Kitchen levonorgestrel (MIRENA) 20 MCG/24HR IUD 1 each by Intrauterine route once.    Marland Kitchen omeprazole (PRILOSEC) 40 MG capsule TAKE 1 CAPSULE(40 MG) BY MOUTH DAILY 90 capsule 0  . albuterol (PROVENTIL HFA;VENTOLIN HFA) 108 (90 Base) MCG/ACT inhaler INHALE  2 PUFFS INTO THE LUNGS EVERY 6 HOURS AS NEEDED FOR WHEEZING OR SHORTNESS OF BREATH (Patient not taking: Reported on 02/16/2019) 8.5 g 2  . etanercept (ENBREL SURECLICK) 50 MG/ML injection Inject 50 mg into the skin once a week.      No current facility-administered medications for this visit.     OBJECTIVE: There were no vitals filed for this visit.   There is no height or weight on file to calculate BMI.    ECOG FS:0 - Asymptomatic  General: Well-developed, well-nourished, no acute distress. HEENT: Normocephalic. Neuro: Alert, answering all questions appropriately. Cranial nerves grossly intact. Skin: No rashes or petechiae noted. Psych: Normal affect.   LAB RESULTS:  Lab Results  Component Value Date   NA 141 02/13/2018   K 3.9 02/13/2018   CL 105 02/13/2018   CO2 24 02/13/2018   GLUCOSE 87 02/13/2018   BUN 11 02/13/2018   CREATININE 0.62 02/13/2018   CALCIUM 9.1 02/13/2018   PROT 7.0 02/13/2018   ALBUMIN 4.2 02/13/2018   AST 26 02/13/2018   ALT 24 02/13/2018   ALKPHOS 48 02/13/2018   BILITOT 0.8 02/13/2018   GFRNONAA >60 02/15/2017   GFRAA >60 02/15/2017    Lab Results  Component Value Date   WBC 4.3 02/13/2018   NEUTROABS 2.2 02/13/2018   HGB 14.8 02/13/2018   HCT 41.8 02/13/2018   MCV 100.4 (H) 02/13/2018   PLT 266.0 02/13/2018     STUDIES: Mr Knee Left Wo Contrast  Result Date: 01/26/2019 CLINICAL DATA:  Left knee pain and swelling for 2 months. Difficulty with stairs. EXAM: MRI OF THE LEFT KNEE WITHOUT CONTRAST TECHNIQUE: Multiplanar, multisequence MR imaging of the knee was performed. No intravenous contrast was administered. COMPARISON:  None. FINDINGS: MENISCI Medial meniscus:  Unremarkable Lateral meniscus:  Unremarkable LIGAMENTS Cruciates:  Unremarkable Collaterals:  Unremarkable CARTILAGE Patellofemoral: Partial-thickness chondral thinning along the posterior patellar ridge on image 9/9. Medial:  Unremarkable Lateral:  Unremarkable Joint: Small knee  joint effusion. Focal synovitis medial to the medial patellar facet in just deep to the medial patellar retinaculum. Popliteal Fossa:  Unremarkable Extensor Mechanism: Focal tendinopathy of the distal vastus tendon medially for example on image 6/9, contiguous to the focal synovitis below the medial patellar retinaculum. There is potentially some accentuated signal in the medial patellar retinaculum on image 10/9, possibly from a sprain. Bones: No significant extra-articular osseous abnormalities identified. Other: No supplemental non-categorized findings. IMPRESSION: 1. Focal tendinopathy in the distal medial vastus tendon with immediately adjacent focal synovitis just deep to the medial patellar retinaculum, and some indistinctness of the medial patellar retinaculum which may indicate underlying sprain as well. 2. Partial thickness chondral thinning along the posterior patellar ridge. 3. Small knee joint effusion. Electronically Signed   By: Gaylyn Rong M.D.   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  Her ferritin was greater than 1500.  She will benefit from  therapeutic phlebotomy and will return to clinic later this week to receive 500 mL.  She would then receive monthly phlebotomy in 4 and 8 weeks and then return to clinic in 3 months for further evaluation and continuation of treatment. 2.  Elevated liver enzymes: Possibly related to iron overload.  Continue to monitor.  Phlebotomy as above.  I discussed the assessment and treatment plan with the patient. The patient was provided an opportunity to ask questions and all were answered. The patient agreed with the plan and demonstrated an understanding of the instructions.   The patient was advised to call  back or seek an in-person evaluation if the symptoms worsen or if the condition fails to improve as anticipated.  I provided 45 minutes of face-to-face video visit time during this encounter, and > 50% was spent counseling as documented under my assessment & plan.  [name], MD 02/18/2019 4:58 PM

## 2019-02-16 ENCOUNTER — Inpatient Hospital Stay: Payer: 59 | Attending: Oncology | Admitting: Oncology

## 2019-02-16 ENCOUNTER — Other Ambulatory Visit: Payer: Self-pay

## 2019-02-16 ENCOUNTER — Encounter: Payer: Self-pay | Admitting: Oncology

## 2019-02-16 DIAGNOSIS — Z148 Genetic carrier of other disease: Secondary | ICD-10-CM | POA: Diagnosis not present

## 2019-02-16 DIAGNOSIS — Z793 Long term (current) use of hormonal contraceptives: Secondary | ICD-10-CM | POA: Insufficient documentation

## 2019-02-16 DIAGNOSIS — G8929 Other chronic pain: Secondary | ICD-10-CM | POA: Insufficient documentation

## 2019-02-16 DIAGNOSIS — R748 Abnormal levels of other serum enzymes: Secondary | ICD-10-CM | POA: Insufficient documentation

## 2019-02-16 DIAGNOSIS — Z79899 Other long term (current) drug therapy: Secondary | ICD-10-CM | POA: Insufficient documentation

## 2019-02-16 DIAGNOSIS — I10 Essential (primary) hypertension: Secondary | ICD-10-CM | POA: Insufficient documentation

## 2019-02-16 DIAGNOSIS — M1990 Unspecified osteoarthritis, unspecified site: Secondary | ICD-10-CM | POA: Insufficient documentation

## 2019-02-16 DIAGNOSIS — Z7951 Long term (current) use of inhaled steroids: Secondary | ICD-10-CM | POA: Insufficient documentation

## 2019-02-16 NOTE — Progress Notes (Signed)
Patient is establish care. Patient stated that she has RA and was told to speak to a hematologist to discuss a treatment plan for her.

## 2019-02-19 ENCOUNTER — Inpatient Hospital Stay: Payer: 59

## 2019-02-19 ENCOUNTER — Other Ambulatory Visit: Payer: Self-pay

## 2019-02-19 VITALS — BP 118/83 | HR 89 | Temp 96.0°F | Resp 20

## 2019-02-19 DIAGNOSIS — G8929 Other chronic pain: Secondary | ICD-10-CM | POA: Diagnosis not present

## 2019-02-19 DIAGNOSIS — Z79899 Other long term (current) drug therapy: Secondary | ICD-10-CM | POA: Diagnosis not present

## 2019-02-19 DIAGNOSIS — M1990 Unspecified osteoarthritis, unspecified site: Secondary | ICD-10-CM | POA: Diagnosis not present

## 2019-02-19 DIAGNOSIS — Z7951 Long term (current) use of inhaled steroids: Secondary | ICD-10-CM | POA: Diagnosis not present

## 2019-02-19 DIAGNOSIS — I10 Essential (primary) hypertension: Secondary | ICD-10-CM | POA: Diagnosis not present

## 2019-02-19 DIAGNOSIS — Z148 Genetic carrier of other disease: Secondary | ICD-10-CM

## 2019-02-19 DIAGNOSIS — R748 Abnormal levels of other serum enzymes: Secondary | ICD-10-CM | POA: Diagnosis not present

## 2019-02-19 DIAGNOSIS — Z793 Long term (current) use of hormonal contraceptives: Secondary | ICD-10-CM | POA: Diagnosis not present

## 2019-03-19 ENCOUNTER — Other Ambulatory Visit: Payer: Self-pay | Admitting: Family Medicine

## 2019-03-19 ENCOUNTER — Inpatient Hospital Stay: Payer: 59 | Attending: Oncology

## 2019-03-19 ENCOUNTER — Other Ambulatory Visit: Payer: Self-pay

## 2019-03-19 VITALS — BP 123/89 | HR 96 | Temp 97.0°F | Resp 18

## 2019-03-19 DIAGNOSIS — Z148 Genetic carrier of other disease: Secondary | ICD-10-CM | POA: Diagnosis not present

## 2019-03-20 NOTE — Telephone Encounter (Signed)
Last office visit 02/17/2018 for CPE.  Last refilled 11/28/2018 for #30 with 2 refills.  No future appointments.

## 2019-03-23 MED ORDER — DIAZEPAM 5 MG PO TABS: 5.0000 mg | ORAL_TABLET | Freq: Three times a day (TID) | ORAL | 1 refills | Status: DC | PRN

## 2019-03-23 NOTE — Addendum Note (Signed)
Addended by: Carter Kitten on: 03/23/2019 12:42 PM   Modules accepted: Orders

## 2019-03-23 NOTE — Telephone Encounter (Signed)
Best number [phone] Pt called stating [facility] told her she need paper copy for rx.  Please advise when ready for pick

## 2019-03-23 NOTE — Telephone Encounter (Addendum)
Rx was set on print when refilled on 03/20/2019.   Will have Dr. Lorelei Pont resend electronically.  Pharmacy should not require paper copy.  Marcie Bal notified by telephone.

## 2019-04-15 ENCOUNTER — Other Ambulatory Visit: Payer: Self-pay

## 2019-04-16 ENCOUNTER — Inpatient Hospital Stay: Payer: 59 | Attending: Oncology

## 2019-04-16 ENCOUNTER — Other Ambulatory Visit: Payer: Self-pay

## 2019-04-16 NOTE — Progress Notes (Signed)
Pt.'s last labs were drawn on 02/16/2019. Verbal order from MD to continue with pt.'s phlebotomy today and remove 500 ml.  Pt tolerated phlebotomy well. VSS  Phyllis Myers [employer]

## 2019-05-09 NOTE — Progress Notes (Signed)
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  INTERVAL HISTORY: Patient returns to clinic today for repeat laboratory work, further evaluation, and consideration of phlebotomy.  She recently started Enbrel for rheumatoid arthritis, but has not noticed much difference in her joint pain as of yet.  She otherwise feels well. She has no neurologic complaints.  She denies any recent fevers or illnesses.  She has a good appetite and denies weight loss.  She has no chest pain, shortness of breath, cough, or hemoptysis.  She has no nausea, vomiting, constipation, or diarrhea.  She has no urinary complaints.  Patient offers no further specific complaints today.    REVIEW OF SYSTEMS:   Review of Systems  Constitutional: Negative.  Negative for fever, malaise/fatigue and weight loss.  Respiratory: Negative.  Negative for cough, hemoptysis and shortness of breath.   Cardiovascular: Negative.  Negative for chest pain and leg swelling.  Gastrointestinal: Negative.  Negative for abdominal pain.  Genitourinary: Negative.  Negative for dysuria.  Musculoskeletal: Positive for joint pain.  Skin: Negative.  Negative for rash.  Neurological: Negative.  Negative for dizziness, focal weakness, weakness and headaches.  Psychiatric/Behavioral: Negative.  The patient is not nervous/anxious.     As per HPI. Otherwise, a complete review of systems is negative.  PAST MEDICAL HISTORY: Past Medical History:  Diagnosis Date  . Arthritis   . Depression, major, single episode, mild ([facility]) 12/20/2016  . Hypertension     PAST SURGICAL HISTORY: No past surgical history on file.  FAMILY HISTORY: Family  History  Problem Relation Age of Onset  . Pulmonary Hypertension Mother   . Cardiomyopathy Mother   . Heart disease Mother   . Heart disease Father 45       MI  . Heart disease Maternal Grandfather   . Heart disease Paternal Grandfather     ADVANCED DIRECTIVES (Y/N):  N  HEALTH MAINTENANCE: Social History   Tobacco Use  . Smoking status: Never Smoker  . Smokeless tobacco: Never Used  Substance Use Topics  . Alcohol use: Yes    Alcohol/week: 4.0 standard drinks    Types: 4 Standard drinks or equivalent per week    Comment: occ  . Drug use: No     Colonoscopy:  PAP:  Bone density:  Lipid panel:  Allergies  Allergen Reactions  . Amoxicillin     Muscle pain  . Sulfasalazine     Other reaction(s): Headache  . Naproxen Sodium Rash    Current Outpatient Medications  Medication Sig Dispense Refill  . diazepam (VALIUM) 5 MG tablet Take 1 tablet (5 mg total) by mouth 3 (three) times daily as needed. 30 tablet 1  . etanercept (ENBREL SURECLICK) 50 MG/ML injection Inject 50 mg into the skin once a week.     . etodolac (LODINE) 200 MG capsule Take 400 mg by mouth 2 (two) times daily.     . hydrochlorothiazide (HYDRODIURIL) 25 MG tablet Take by mouth.    . levonorgestrel (MIRENA) 20 MCG/24HR IUD 1 each by Intrauterine route once.    Marland Kitchen omeprazole (PRILOSEC) 40 MG capsule TAKE 1 CAPSULE(40 MG) BY MOUTH DAILY 90 capsule 0  . albuterol (PROVENTIL HFA;VENTOLIN HFA) 108 (90 Base) MCG/ACT inhaler INHALE 2 PUFFS INTO  THE LUNGS EVERY 6 HOURS AS NEEDED FOR WHEEZING OR SHORTNESS OF BREATH (Patient not taking: Reported on 02/16/2019) 8.5 g 2   No current facility-administered medications for this visit.     OBJECTIVE: Vitals:   05/15/19 1424  BP: (!) 137/101  Pulse: 95  Resp: 18  Temp: (!) 97.2 F (36.2 C)     Body mass index is 32.95 kg/m.    ECOG FS:0 - Asymptomatic  General: Well-developed, well-nourished, no acute distress. Eyes: Pink conjunctiva, anicteric sclera. HEENT:  Normocephalic, moist mucous membranes. Lungs: Clear to auscultation bilaterally. Heart: Regular rate and rhythm. No rubs, murmurs, or gallops. Abdomen: Soft, nontender, nondistended. No organomegaly noted, normoactive bowel sounds. Musculoskeletal: No edema, cyanosis, or clubbing. Neuro: Alert, answering all questions appropriately. Cranial nerves grossly intact. Skin: No rashes or petechiae noted. Psych: Normal affect.  LAB RESULTS:  Lab Results  Component Value Date   NA 141 02/13/2018   K 3.9 02/13/2018   CL 105 02/13/2018   CO2 24 02/13/2018   GLUCOSE 87 02/13/2018   BUN 11 02/13/2018   CREATININE 0.62 02/13/2018   CALCIUM 9.1 02/13/2018   PROT 7.0 02/13/2018   ALBUMIN 4.2 02/13/2018   AST 26 02/13/2018   ALT 24 02/13/2018   ALKPHOS 48 02/13/2018   BILITOT 0.8 02/13/2018   GFRNONAA >60 02/15/2017   GFRAA >60 02/15/2017    Lab Results  Component Value Date   WBC 6.2 05/15/2019   NEUTROABS 4.4 05/15/2019   HGB 15.3 (H) 05/15/2019   HCT 42.4 05/15/2019   MCV 98.4 05/15/2019   PLT 192 05/15/2019   Lab Results  Component Value Date   IRON 166 05/15/2019   TIBC 390 05/15/2019   IRONPCTSAT 43 (H) 05/15/2019   Lab Results  Component Value Date   FERRITIN 633 (H) 05/15/2019     STUDIES: No results found.  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  Iron saturation has also improved to 43%.  Patient does not wish to have additional phlebotomy today, therefore will return to clinic in 3 months with repeat laboratory work, further evaluation, and consideration of additional phlebotomy.   2.  Elevated liver enzymes: Resolved. 3.  Rheumatoid arthritis: Continue Enbrel as per rheumatology. 4.  Elevated ferritin: Could possibly be secondary to iron  overload, but may be falsely elevated as an acute phase reactant from her rheumatoid arthritis.  Hold phlebotomy as above.  Continue to monitor closely.  I spent a total of 30 minutes face-to-face with the patient of which greater than 50% of the visit was spent in counseling and coordination of care as detailed above.   [name], MD 05/15/2019 5:17 PM

## 2019-05-14 ENCOUNTER — Other Ambulatory Visit: Payer: Self-pay

## 2019-05-15 ENCOUNTER — Inpatient Hospital Stay (HOSPITAL_BASED_OUTPATIENT_CLINIC_OR_DEPARTMENT_OTHER): Payer: 59 | Admitting: Oncology

## 2019-05-15 ENCOUNTER — Inpatient Hospital Stay: Payer: 59

## 2019-05-15 ENCOUNTER — Encounter: Payer: Self-pay | Admitting: Oncology

## 2019-05-15 ENCOUNTER — Inpatient Hospital Stay: Payer: 59 | Attending: Oncology

## 2019-05-15 ENCOUNTER — Other Ambulatory Visit: Payer: Self-pay

## 2019-05-15 VITALS — BP 137/101 | HR 95 | Temp 97.2°F | Resp 18 | Wt 196.5 lb

## 2019-05-15 DIAGNOSIS — Z148 Genetic carrier of other disease: Secondary | ICD-10-CM

## 2019-05-15 DIAGNOSIS — R7989 Other specified abnormal findings of blood chemistry: Secondary | ICD-10-CM | POA: Diagnosis not present

## 2019-05-15 DIAGNOSIS — M1990 Unspecified osteoarthritis, unspecified site: Secondary | ICD-10-CM | POA: Diagnosis not present

## 2019-05-15 DIAGNOSIS — Z79899 Other long term (current) drug therapy: Secondary | ICD-10-CM | POA: Insufficient documentation

## 2019-05-15 DIAGNOSIS — M069 Rheumatoid arthritis, unspecified: Secondary | ICD-10-CM | POA: Diagnosis not present

## 2019-05-15 DIAGNOSIS — I10 Essential (primary) hypertension: Secondary | ICD-10-CM | POA: Insufficient documentation

## 2019-05-15 DIAGNOSIS — Z793 Long term (current) use of hormonal contraceptives: Secondary | ICD-10-CM | POA: Insufficient documentation

## 2019-05-15 LAB — CBC WITH DIFFERENTIAL/PLATELET
Abs Immature Granulocytes: 0.02 10*3/uL (ref 0.00–0.07)
Basophils Absolute: 0.1 10*3/uL (ref 0.0–0.1)
Basophils Relative: 1 %
Eosinophils Absolute: 0.1 10*3/uL (ref 0.0–0.5)
Eosinophils Relative: 1 %
HCT: 42.4 % (ref 36.0–46.0)
Hemoglobin: 15.3 g/dL — ABNORMAL HIGH (ref 12.0–15.0)
Immature Granulocytes: 0 %
Lymphocytes Relative: 17 %
Lymphs Abs: 1.1 10*3/uL (ref 0.7–4.0)
MCH: 35.5 pg — ABNORMAL HIGH (ref 26.0–34.0)
MCHC: 36.1 g/dL — ABNORMAL HIGH (ref 30.0–36.0)
MCV: 98.4 fL (ref 80.0–100.0)
Monocytes Absolute: 0.6 10*3/uL (ref 0.1–1.0)
Monocytes Relative: 9 %
Neutro Abs: 4.4 10*3/uL (ref 1.7–7.7)
Neutrophils Relative %: 72 %
Platelets: 192 10*3/uL (ref 150–400)
RBC: 4.31 MIL/uL (ref 3.87–5.11)
RDW: 11.4 % — ABNORMAL LOW (ref 11.5–15.5)
WBC: 6.2 10*3/uL (ref 4.0–10.5)
nRBC: 0 % (ref 0.0–0.2)

## 2019-05-15 LAB — FERRITIN: Ferritin: 633 ng/mL — ABNORMAL HIGH (ref 11–307)

## 2019-05-15 LAB — IRON AND TIBC
Iron: 166 ug/dL (ref 28–170)
Saturation Ratios: 43 % — ABNORMAL HIGH (ref 10.4–31.8)
TIBC: 390 ug/dL (ref 250–450)
UIBC: 224 ug/dL

## 2019-06-16 ENCOUNTER — Telehealth: Payer: Self-pay | Admitting: Family Medicine

## 2019-06-16 NOTE — Telephone Encounter (Signed)
Last office visit 02/26/2018.  Last refilled 03/23/2019 for #30 with 1 refill.  No future appointment with PCP.

## 2019-06-17 NOTE — Telephone Encounter (Signed)
Ok to refill, #30, but schedule CPX

## 2019-06-17 NOTE — Telephone Encounter (Signed)
Please sign refill since it is a controlled substance to send electronically.  Then forward to Robin to schedule CPE.

## 2019-06-29 NOTE — Telephone Encounter (Signed)
Left message asking pt to call office  °

## 2019-07-01 NOTE — Telephone Encounter (Signed)
Patient returned Robin's call.  Patient said she's seeing several doctors right now.  She has her Hematologist,Rheumatologist, and she just had a physical with her gynecologist. The gynecologist was suppose to send her lab results to [facility] [place].  Patient just wants to make sure she has to come in for the physical.

## 2019-07-01 NOTE — Telephone Encounter (Signed)
Left message asking pt to call office  °

## 2019-07-02 MED ORDER — DIAZEPAM 5 MG PO TABS: 5.0000 mg | ORAL_TABLET | Freq: Three times a day (TID) | ORAL | 1 refills | Status: DC | PRN

## 2019-07-02 NOTE — Addendum Note (Signed)
Addended by: Owens Loffler on: 07/02/2019 01:21 PM   Modules accepted: Orders

## 2019-07-02 NOTE — Telephone Encounter (Signed)
With all that going on, I am OK if she pushes out her appointment with me some.  We can cancel her f/u CPX but f/u after the new year

## 2019-07-10 NOTE — Telephone Encounter (Signed)
Appointment 2/10 pt aware

## 2019-08-04 ENCOUNTER — Other Ambulatory Visit: Payer: Self-pay | Admitting: Rheumatology

## 2019-08-04 DIAGNOSIS — R200 Anesthesia of skin: Secondary | ICD-10-CM

## 2019-08-04 DIAGNOSIS — M25572 Pain in left ankle and joints of left foot: Secondary | ICD-10-CM

## 2019-08-14 ENCOUNTER — Ambulatory Visit
Admission: RE | Admit: 2019-08-14 | Discharge: 2019-08-14 | Disposition: A | Discharge status: Home / Self Care | Payer: 59 | Source: Ambulatory Visit | Attending: Rheumatology | Admitting: Rheumatology

## 2019-08-14 ENCOUNTER — Other Ambulatory Visit: Payer: Self-pay

## 2019-08-14 DIAGNOSIS — R200 Anesthesia of skin: Secondary | ICD-10-CM | POA: Insufficient documentation

## 2019-08-14 DIAGNOSIS — M25572 Pain in left ankle and joints of left foot: Secondary | ICD-10-CM | POA: Diagnosis present

## 2019-08-15 NOTE — Progress Notes (Deleted)
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  INTERVAL HISTORY: Patient returns to clinic today for repeat laboratory work, further evaluation, and consideration of phlebotomy.  She recently started Enbrel for rheumatoid arthritis, but has not noticed much difference in her joint pain as of yet.  She otherwise feels well. She has no neurologic complaints.  She denies any recent fevers or illnesses.  She has a good appetite and denies weight loss.  She has no chest pain, shortness of breath, cough, or hemoptysis.  She has no nausea, vomiting, constipation, or diarrhea.  She has no urinary complaints.  Patient offers no further specific complaints today.    REVIEW OF SYSTEMS:   Review of Systems  Constitutional: Negative.  Negative for fever, malaise/fatigue and weight loss.  Respiratory: Negative.  Negative for cough, hemoptysis and shortness of breath.   Cardiovascular: Negative.  Negative for chest pain and leg swelling.  Gastrointestinal: Negative.  Negative for abdominal pain.  Genitourinary: Negative.  Negative for dysuria.  Musculoskeletal: Positive for joint pain.  Skin: Negative.  Negative for rash.  Neurological: Negative.  Negative for dizziness, focal weakness, weakness and headaches.  Psychiatric/Behavioral: Negative.  The patient is not nervous/anxious.     As per HPI. Otherwise, a complete review of systems is negative.  PAST MEDICAL HISTORY: Past Medical History:  Diagnosis Date  . Arthritis   . Depression, major, single episode, mild (HCC) 12/20/2016  . Hypertension     PAST SURGICAL HISTORY: No past surgical history on file.  FAMILY HISTORY: Family  History  Problem Relation Age of Onset  . Pulmonary Hypertension Mother   . Cardiomyopathy Mother   . Heart disease Mother   . Heart disease Father 3       MI  . Heart disease Maternal Grandfather   . Heart disease Paternal Grandfather     ADVANCED DIRECTIVES (Y/N):  N  HEALTH MAINTENANCE: Social History   Tobacco Use  . Smoking status: Never Smoker  . Smokeless tobacco: Never Used  Substance Use Topics  . Alcohol use: Yes    Alcohol/week: 4.0 standard drinks    Types: 4 Standard drinks or equivalent per week    Comment: occ  . Drug use: No     Colonoscopy:  PAP:  Bone density:  Lipid panel:  Allergies  Allergen Reactions  . Amoxicillin     Muscle pain  . Sulfasalazine     Other reaction(s): Headache  . Naproxen Sodium Rash    Current Outpatient Medications  Medication Sig Dispense Refill  . albuterol (PROVENTIL HFA;VENTOLIN HFA) 108 (90 Base) MCG/ACT inhaler INHALE 2 PUFFS INTO THE LUNGS EVERY 6 HOURS AS NEEDED FOR WHEEZING OR SHORTNESS OF BREATH (Patient not taking: Reported on 02/16/2019) 8.5 g 2  . diazepam (VALIUM) 5 MG tablet Take 1 tablet (5 mg total) by mouth every 8 (eight) hours as needed for anxiety. 30 tablet 1  . etanercept (ENBREL SURECLICK) 50 MG/ML injection Inject 50 mg into the skin once a week.     . etodolac (LODINE) 200 MG capsule Take 400 mg by mouth 2 (two) times daily.     . hydrochlorothiazide (HYDRODIURIL) 25 MG tablet Take by mouth.    . levonorgestrel (MIRENA) 20 MCG/24HR IUD 1  each by Intrauterine route once.    Marland Kitchen omeprazole (PRILOSEC) 40 MG capsule TAKE 1 CAPSULE(40 MG) BY MOUTH DAILY 90 capsule 0   No current facility-administered medications for this visit.     OBJECTIVE: There were no vitals filed for this visit.   There is no height or weight on file to calculate BMI.    ECOG FS:0 - Asymptomatic  General: Well-developed, well-nourished, no acute distress. Eyes: Pink conjunctiva, anicteric sclera. HEENT: Normocephalic, moist  mucous membranes. Lungs: Clear to auscultation bilaterally. Heart: Regular rate and rhythm. No rubs, murmurs, or gallops. Abdomen: Soft, nontender, nondistended. No organomegaly noted, normoactive bowel sounds. Musculoskeletal: No edema, cyanosis, or clubbing. Neuro: Alert, answering all questions appropriately. Cranial nerves grossly intact. Skin: No rashes or petechiae noted. Psych: Normal affect.  LAB RESULTS:  Lab Results  Component Value Date   NA 141 02/13/2018   K 3.9 02/13/2018   CL 105 02/13/2018   CO2 24 02/13/2018   GLUCOSE 87 02/13/2018   BUN 11 02/13/2018   CREATININE 0.62 02/13/2018   CALCIUM 9.1 02/13/2018   PROT 7.0 02/13/2018   ALBUMIN 4.2 02/13/2018   AST 26 02/13/2018   ALT 24 02/13/2018   ALKPHOS 48 02/13/2018   BILITOT 0.8 02/13/2018   GFRNONAA >60 02/15/2017   GFRAA >60 02/15/2017    Lab Results  Component Value Date   WBC 6.2 05/15/2019   NEUTROABS 4.4 05/15/2019   HGB 15.3 (H) 05/15/2019   HCT 42.4 05/15/2019   MCV 98.4 05/15/2019   PLT 192 05/15/2019   Lab Results  Component Value Date   IRON 166 05/15/2019   TIBC 390 05/15/2019   IRONPCTSAT 43 (H) 05/15/2019   Lab Results  Component Value Date   FERRITIN 633 (H) 05/15/2019     STUDIES: Mr Lumbar Spine Wo Contrast  Result Date: 08/14/2019 CLINICAL DATA:  Initial evaluation for chronic low back pain with new onset bilateral foot pain over last 6 months. EXAM: MRI LUMBAR SPINE WITHOUT CONTRAST TECHNIQUE: Multiplanar, multisequence MR imaging of the lumbar spine was performed. No intravenous contrast was administered. COMPARISON:  None available. FINDINGS: Segmentation: Standard. Lowest well-formed disc space labeled the L5-S1 level. Alignment: 5 mm anterolisthesis of L5 on S1, chronic and facet mediated. Alignment otherwise normal with preservation of the normal lumbar lordosis. Vertebrae: Vertebral body height maintained without evidence for acute or chronic fracture. Bone marrow signal  intensity within normal limits. Prominent 2.6 cm benign hemangioma noted within the left aspect of the T12 vertebral body. Additional small benign hemangioma noted within the right pedicle of L5. No other discrete or worrisome osseous lesions. Mild reactive edema about the left greater than right L5-S1 facets due to facet arthritis. No other abnormal marrow edema. Conus medullaris and cauda equina: Conus extends to the T12-L1 level. Conus and cauda equina appear normal. Paraspinal and other soft tissues: Paraspinous soft tissues within normal limits. Visualized visceral structures within normal limits. Disc levels: T11-12: Unremarkable. T12-L1: Unremarkable. L1-2:  Mild annular disc bulge.  No canal or foraminal stenosis. L2-3: Mild diffuse disc bulge with disc desiccation. No significant canal or foraminal stenosis. L3-4: Mild annular disc bulge with disc desiccation. No significant canal or foraminal stenosis. L4-5: Mild diffuse disc bulge with disc desiccation. Mild facet hypertrophy. No canal or neural foraminal stenosis. No impingement. L5-S1: 5 mm anterolisthesis. Associated broad posterior pseudo disc bulge/uncovering. Severe bilateral facet arthrosis with associated small joint effusions. Resultant moderate canal with bilateral subarticular stenosis, with mild to moderate left greater than  right L5 foraminal narrowing. IMPRESSION: 1. 5 mm anterolisthesis of L5 on S1 with associated disc bulge and severe bilateral facet arthrosis, resulting in moderate canal with bilateral subarticular stenosis, with mild to moderate left greater than right L5 foraminal narrowing. 2. Mild noncompressive disc bulging elsewhere within the lumbar spine without significant stenosis or impingement. Electronically Signed   By: Rise MuBenjamin  McClintock M.D.   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 Heterozygote for hemochromatosis with single C282Y gene  mutation: Patient's hemoglobin remains mildly elevated at 15.3, but her ferritin has significantly improved to 633.  Iron saturation has also improved to 43%.  Patient does not wish to have additional phlebotomy today, therefore will return to clinic in 3 months with repeat laboratory work, further evaluation, and consideration of additional phlebotomy.   2.  Elevated liver enzymes: Resolved. 3.  Rheumatoid arthritis: Continue Enbrel as per rheumatology. 4.  Elevated ferritin: Could possibly be secondary to iron overload, but may be falsely elevated as an acute phase reactant from her rheumatoid arthritis.  Hold phlebotomy as above.  Continue to monitor closely.  I spent a total of 30 minutes face-to-face with the patient of which greater than 50% of the visit was spent in counseling and coordination of care as detailed above.   Jeralyn Ruthsimothy J Darlinda Bellows, MD 08/15/2019 7:33 AM

## 2019-08-17 ENCOUNTER — Inpatient Hospital Stay: Payer: 59

## 2019-08-20 ENCOUNTER — Inpatient Hospital Stay: Payer: 59 | Admitting: Oncology

## 2019-08-20 ENCOUNTER — Inpatient Hospital Stay: Payer: 59

## 2019-08-25 ENCOUNTER — Other Ambulatory Visit: Payer: Self-pay | Admitting: Neurosurgery

## 2019-08-25 DIAGNOSIS — M21372 Foot drop, left foot: Secondary | ICD-10-CM

## 2019-08-28 ENCOUNTER — Telehealth: Payer: Self-pay | Admitting: *Deleted

## 2019-09-11 ENCOUNTER — Other Ambulatory Visit: Payer: Self-pay

## 2019-09-11 ENCOUNTER — Ambulatory Visit
Admission: RE | Admit: 2019-09-11 | Discharge: 2019-09-11 | Disposition: A | Discharge status: Home / Self Care | Payer: 59 | Source: Ambulatory Visit | Attending: Neurosurgery | Admitting: Neurosurgery

## 2019-09-11 DIAGNOSIS — M21372 Foot drop, left foot: Secondary | ICD-10-CM | POA: Diagnosis not present

## 2019-09-18 ENCOUNTER — Other Ambulatory Visit: Payer: Self-pay | Admitting: *Deleted

## 2019-09-18 NOTE — Telephone Encounter (Signed)
Last office visit 02/17/2018.  Last refilled 07/02/2019 for #30 with 1 refill.  CPE scheduled for 11/11/2019.

## 2019-09-19 MED ORDER — DIAZEPAM 5 MG PO TABS: 5.0000 mg | ORAL_TABLET | Freq: Three times a day (TID) | ORAL | 1 refills | Status: DC | PRN

## 2019-09-22 ENCOUNTER — Ambulatory Visit: Payer: 59 | Admitting: Occupational Therapy

## 2019-09-23 ENCOUNTER — Encounter: Payer: Self-pay | Admitting: Physical Therapy

## 2019-09-23 ENCOUNTER — Ambulatory Visit: Payer: 59 | Attending: Rheumatology | Admitting: Physical Therapy

## 2019-09-23 ENCOUNTER — Other Ambulatory Visit: Payer: Self-pay

## 2019-09-23 DIAGNOSIS — M79675 Pain in left toe(s): Secondary | ICD-10-CM | POA: Diagnosis not present

## 2019-09-23 DIAGNOSIS — M545 Low back pain: Secondary | ICD-10-CM | POA: Insufficient documentation

## 2019-09-23 DIAGNOSIS — G8929 Other chronic pain: Secondary | ICD-10-CM | POA: Diagnosis present

## 2019-09-23 DIAGNOSIS — R2689 Other abnormalities of gait and mobility: Secondary | ICD-10-CM | POA: Insufficient documentation

## 2019-09-23 NOTE — Therapy (Signed)
[facility] [address], [place], [address] Phone: [phone]   Fax:  [phone]  Physical Therapy Treatment  Patient Details  Name: Phyllis Myers MRN: [id number] Date of Birth: April 29, 1981 No data recorded  Encounter Date: 09/23/2019  PT End of Session - 09/23/19 1625    Visit Number  1    Number of Visits  16    Date for PT Re-Evaluation  11/19/19    PT Start Time  0400    PT Stop Time  0500    PT Time Calculation (min)  60 min    Activity Tolerance  Patient tolerated treatment well    Behavior During Therapy  [facility] for tasks assessed/performed       Past Medical History:  Diagnosis Date  . Arthritis   . Depression, major, single episode, mild ([place]) 12/20/2016  . Hypertension     History reviewed. No pertinent surgical history.  There were no vitals filed for this visit.  Subjective Assessment - 09/23/19 1607    Pertinent History  Pt is a 38 year old female that has had toe weakness and numbness in all 5 toes since September. Reports she woke up one day and could not feel her toes or move them. Reports she has a history with LBP that is chronic and continues to have LBP despite injections a couple weeks ago. Worst LBP in past week 8/10, best 2/10, pain is midline and achy. LBP inhibits prolonged standing for 72mns to cook a meal, vacuum, laundry; reports pain is made better by flexing forward onto couch. Reports toes are not painful, just numb and immobile, although they do cramp. Reports good ankle mobility and sensation. 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 Prior to toe numbess, and LBP exacerbation patient was running for fitness and she wants to get back to this. Reports she has difficulty with balance needed to don/doff pants, and with showering.  Patient lives alone with small dog.Pt denies N/V, B&B changes, unexplained weight fluctuation, saddle paresthesia, fever, night sweats, or unrelenting night pain at this time.    How long can you sit comfortably?  unlimited    How long can you stand comfortably?  138ms    How long can you walk comfortably?  unlimited    Diagnostic tests  MRI lumbar spine: 76m49mnterolisthesis of L5 on S1 with disc bulge; Mild noncompressive disc bulging elsewhere within the lumbarspine without significant stenosis or impingement. Nerve Conduction: inclusive other than some RA inflammation (to completed again), with subsequent MRI of brain scheduled    Patient Stated Goals  Go back to running, increase balance    Currently in Pain?  Yes    Pain Location  Back    Pain Orientation  Posterior    Pain Descriptors / Indicators  Aching    Pain Type  Chronic pain    Pain Radiating Towards  none    Pain Onset  1 to 4 weeks ago    Pain Frequency  Constant    Aggravating Factors   prolonged standing    Pain Relieving Factors  bending forward on couch    Effect of Pain on Daily Activities  Wants to start running for fitness, difficulty with SL tasks to don/doff clothes and shower  OBJECTIVE  MUSCULOSKELETAL: Tremor: Absent Bulk: Normal Tone: Normal, no clonus No trophic changes noted to foot/ankle. No ecchymosis, erythema, or edema noted. No gross ankle/foot deformity noted  Lumbar/Hip/Knee Screen AROM: WFL and painless with overpressure in all planes  Posture No gross abnormalities noted in seated or standing posture  Gait Decreased arch bilat with slight pronation throughout gait  During L swing strong FT ext contraction to aid in foot clearance with slight circumduction Decreased L foot heel strike, near L flat foot with slight foot drop at met head and toes   Palpation No pain to palpation along medial and lateral malleoli. No pain over anterior or posterior ankle. Achilles tendon intact  and painless to palpation.  Strength R/L 5/5 Hip flexion 5/5 Hip external rotation 5/5 Hip internal rotation 5/5 Hip extension  5/5 Hip abduction 5/5 Hip adduction 5/5 Knee extension 5/5 Knee flexion 5/5 Ankle Plantarflexion 5/5 Ankle Dorsiflexion 5/5 Ankle Inversion 5/5 Ankle Eversion 5/4+ with fasiculation GT ext 5/3 GT flex 5/4+ Metatarsal ext (tested at PIP) 2-4 5/3 Metatarsal flx (tested at PIP) 2-4  AROM R/L 59/55 Ankle Plantarflexion Reports feels stiff on the L 19/10 Ankle Dorsiflexion 45/45 Ankle Inversion 25/25 Ankle Eversion 65/59 GT Ext 70d/17d GT flex *Indicates Pain  PROM R/L 50/50 Ankle Plantarflexion 28/22 Ankle Dorsiflexion 35/35 Ankle Inversion 15/15 Ankle Eversion 90/82 GT Ext 70d/70d GT flex *Indicates Pain  Passive Accessory Motion Superior Tibiofibular Joint: WNL Inferior Tibiofibular Joint: WNL Talocrural Joint Distraction: WNL Talocrural Joint AP: WNL Talocrural Joint PA: WNL  NEUROLOGICAL:  Mental Status Patient is oriented to person, place and time.  Recent memory is intact.  Remote memory is intact.  Attention span and concentration are intact.  Expressive speech is intact.  Patient's fund of knowledge is within normal limits for educational level.  Sensation Grossly intact to light touch bilateral LEs as determined by testing dermatomes L2-S2 Proprioception and hot/cold testing deferred on this date  Reflexes R/L 2+/2+ Knee Jerk (L3/4) 2+/1+ Ankle Jerk (S1/2)  VASCULAR Dorsalis pedis and posterior tibial pulses are palpable  SPECIAL TESTS R SLS 14sec L SLS 6sec  SLS on foam about 5sec bilat SLS with eyes closed unable bilat  Pronation/Supination Navicular Drop:negative bilat   Nerve Test Tarsal Tunnel Test (maximal DF, EV, toe ext with tapping over tarsal tunnel): Negative bilat Test for Morton's Neuroma (compress metatarsals and mobilize): Negative bilat  Other Windlass Mechanism Test: Negative  bilat  Ther-Ex Seated GT ext x10 Seated towel scrunches x10  DF stretch 30sec hold GT ext stretch 30sec  Education on peripheral nerve entrapment and nerve/muscle relationship with visual and verbal explanation, good understanding                    PT Education - 09/23/19 1625    Education Details  Patient was educated on diagnosis, anatomy and pathology involved, prognosis, role of PT, and was given an HEP, demonstrating exercise with proper form following verbal and tactile cues, and was given a paper hand out to continue exercise at home. Pt was educated on and agreed to plan of care.    Person(s) Educated  Patient    Methods  Explanation;Demonstration;Verbal cues;Tactile cues;Handout    Comprehension  Verbalized understanding;Returned demonstration;Verbal cues required;Tactile cues required;Need further instruction       PT Short Term Goals - 09/24/19 0942      PT SHORT TERM GOAL #1   Title  Pt will be independent with HEP in order to decrease ankle pain  and increase strength in order to improve pain-free function at home and work.    Baseline  09/23/19 HEP given    Time  4    Period  Weeks    Status  New        PT Long Term Goals - 09/24/19 1000      PT LONG TERM GOAL #1   Title  Patient will demonstrate active ankle and GT ROM in order to complete gait cycle without compensation    Baseline  09/24/19 R/L 65/59 GT Ext; 70d/17d GT flex; 19/10 Ankle Dorsiflexion    Time  8    Period  Weeks    Status  New      PT LONG TERM GOAL #2   Title  Patient will demonstrate L SLS time of 14sec to demonstrate symmetry to RLE, and PLOF for balance needed for donning/doffing clothing    Baseline  09/23/19 R: 14sec L: 6sec    Time  8    Period  Weeks    Status  New      PT LONG TERM GOAL #3   Title  Patient will demonstrate L 5/5 gross GT and ankle MMT to demonstrate symmetry to RLE    Baseline  09/23/19 R/L 5/4+ with fasiculation GT ext; 5/3 GT flex; 5/4+  Metatarsal ext (tested at PIP) 2-4; 5/3 Metatarsal flx (tested at PIP) 2-4    Time  8    Period  Weeks    Status  New      PT LONG TERM GOAL #4   Title  Patient will demonstrate normalized gait in ambulation and jogging without compensation for safe return to fitness regimen    Baseline  09/23/19 Decreased arch bilat with slight pronation throughout gait; During L swing strong FT ext contraction to aid in foot clearance with slight circumduction; Decreased L foot heel strike, near L flat foot with slight foot drop at met head and toes    Time  8    Period  Weeks    Status  New            Plan - 09/23/19 1707    Clinical Impression Statement  Patient is a 38 year old female presenting with mild foot drop, metatarsal numbess/lack of activation. Related/secondary issue of chronic LBP with 55m anterololisthesis of L5 on S1. Patient being followed by neurology and podiatry as well. Impairments in metatarsal sensation, GT activation/strength, balance, gait, and ankle ROM and strength. Activity limitations in static balance activities (dressing, bathing), walking, and jogging/running; inhibiting full participation in ADLs and exercise regimen.Would benefit from skilled PT to address above deficits and promote optimal return to PLOF    Personal Factors and Comorbidities  Behavior Pattern;Fitness;Comorbidity 1;Sex    Comorbidities  HTN    Examination-Activity Limitations  Bathing;Dressing;Stand    Examination-Participation Restrictions  Cleaning;Community Activity    Stability/Clinical Decision Making  Evolving/Moderate complexity    Clinical Decision Making  Moderate    Rehab Potential  Good    PT Frequency  2x / week    PT Duration  8 weeks    PT Treatment/Interventions  ADLs/Self Care Home Management;Cryotherapy;Ultrasound;Stair training;Balance training;Dry needling;Joint Manipulations;Passive range of motion;Spinal Manipulations;Manual techniques;Patient/family education;Therapeutic  exercise;Therapeutic activities;Neuromuscular re-education;Moist Heat;Gait training;DME Instruction;Functional mobility training    PT Next Visit Plan  Alan's DF strengthening    PT Home Exercise Plan  [id number] + GT Ext stretch    Consulted and Agree with Plan of Care  Patient  Patient will benefit from skilled therapeutic intervention in order to improve the following deficits and impairments:  Abnormal gait, Decreased balance, Decreased endurance, Decreased mobility, Difficulty walking, Impaired sensation, Decreased activity tolerance, Decreased strength, Improper body mechanics, Decreased range of motion, Impaired flexibility, Increased fascial restricitons, Postural dysfunction, Pain  Visit Diagnosis: Pain in left toe(s)  Chronic midline low back pain without sciatica  Other abnormalities of gait and mobility     Problem List Patient Active Problem List   Diagnosis Date Noted  . 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 Depression, major, single episode, mild ([place]) 12/20/2016  . GERD (gastroesophageal reflux disease) 12/02/2015  . Insomnia 11/26/2014  . Allergic rhinitis 07/21/2014  . Panic disorder 07/21/2014  . Generalized anxiety disorder 09/08/2013  . Family history of early CAD 06/09/2013  . Reactive arthritis ([place]) 01/27/2013  . Common migraine 01/27/2013  . HTN (hypertension) 01/27/2013  . Hyperlipidemia, borderline 01/27/2013  . Hx of abnormal Pap smear 01/27/2013   Shelton Silvas PT, DPT Shelton Silvas 09/24/2019, 10:16 AM  [facility] [address], [place], [address] Phone: [phone]   Fax:  [phone]  Name: GWENDOLIN BRIEL MRN: [id number] Date of Birth: [birth year]

## 2019-09-28 ENCOUNTER — Ambulatory Visit: Payer: 59 | Admitting: Physical Therapy

## 2019-09-30 ENCOUNTER — Encounter: Payer: 59 | Admitting: Physical Therapy

## 2019-10-05 ENCOUNTER — Ambulatory Visit: Payer: 59 | Admitting: Physical Therapy

## 2019-10-07 ENCOUNTER — Ambulatory Visit: Payer: 59 | Admitting: Physical Therapy

## 2019-10-09 ENCOUNTER — Other Ambulatory Visit: Payer: Self-pay | Admitting: Neurology

## 2019-10-09 DIAGNOSIS — R531 Weakness: Secondary | ICD-10-CM

## 2019-10-13 ENCOUNTER — Ambulatory Visit: Payer: 59 | Attending: Rheumatology | Admitting: Physical Therapy

## 2019-10-13 ENCOUNTER — Encounter: Payer: Self-pay | Admitting: Physical Therapy

## 2019-10-13 ENCOUNTER — Other Ambulatory Visit: Payer: Self-pay

## 2019-10-13 DIAGNOSIS — R2689 Other abnormalities of gait and mobility: Secondary | ICD-10-CM

## 2019-10-13 DIAGNOSIS — G8929 Other chronic pain: Secondary | ICD-10-CM | POA: Diagnosis present

## 2019-10-13 DIAGNOSIS — M79675 Pain in left toe(s): Secondary | ICD-10-CM | POA: Insufficient documentation

## 2019-10-13 DIAGNOSIS — M545 Low back pain: Secondary | ICD-10-CM | POA: Insufficient documentation

## 2019-10-13 NOTE — Therapy (Signed)
[facility] [address] [place], [address] Phone: [phone]   Fax:  [phone]  Physical Therapy Treatment  Patient Details  Name: Phyllis Myers MRN: [id number] Date of Birth: 01/23/1981 No data recorded  Encounter Date: 10/13/2019  PT End of Session - 10/13/19 1647    Visit Number  2    Number of Visits  16    Date for PT Re-Evaluation  11/19/19    PT Start Time  0400    PT Stop Time  0445    PT Time Calculation (min)  45 min    Activity Tolerance  Patient tolerated treatment well    Behavior During Therapy  [facility] for tasks assessed/performed       Past Medical History:  Diagnosis Date  . Arthritis   . Depression, major, single episode, mild ([place]) 12/20/2016  . Hypertension     History reviewed. No pertinent surgical history.  There were no vitals filed for this visit.  Subjective Assessment - 10/13/19 1601    Subjective  Patient reports she stopped doing her HEP due to soreness with towel crunches.    Pertinent History  Pt is a 39 year old female that has had toe weakness and numbness in all 5 toes since September. Reports she woke up one day and could not feel her toes or move them. Reports she has a history with LBP that is chronic and continues to have LBP despite injections a couple weeks ago. Worst LBP in past week 8/10, best 2/10, pain is midline and achy. LBP inhibits prolonged standing for 37mns to cook a meal, vacuum, laundry; reports pain is made better by flexing forward onto couch. Reports toes are not painful, just numb and immobile, although they do cramp. Reports good ankle mobility and sensation. 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 Prior to toe numbess, and LBP exacerbation patient was running for fitness and she wants to get back  to this. Reports she has difficulty with balance needed to don/doff pants, and with showering. Patient lives alone with small dog.Pt denies N/V, B&B changes, unexplained weight fluctuation, saddle paresthesia, fever, night sweats, or unrelenting night pain at this time.    How long can you sit comfortably?  unlimited    How long can you stand comfortably?  134ms    How long can you walk comfortably?  unlimited    Diagnostic tests  MRI lumbar spine: 17m21mnterolisthesis of L5 on S1 with disc bulge; Mild noncompressive disc bulging elsewhere within the lumbarspine without significant stenosis or impingement. Nerve Conduction: inclusive other than some RA inflammation (to completed again), with subsequent MRI of brain scheduled    Patient Stated Goals  Go back to running, increase balance          Ther-Ex Towel scrunches x10  Gastroc stretch on slant board 2x 1mi39mDF with forefoot off step 2x 10 with cuing to prevent post hip translation with good carry over Attempted SL heel raise 50% ROM; SL heel raise on total gym L 19 3x 10 Seated heel raise with UE pressure x10 with cuing for proper technique with good carry over L SLS with alt nose to abduction 30sec; SLS on foam 5sec; on foam tandem stance 15sec Ball toss/catch in SLS 2x 10 with PT  tossing ball slightly outside  Reverse lunge 2x 6 each LE with cuing for technique with good carry over following; for increased GT ext and for LLE balance  Gait Training 1.12mh on treadmill without grade, and with 5% grade over 864ms with cuing for DF/heel strike without ext hallucis activation which patient is able to carry over very well with demo and TC/VC. PT very pleased with patients ability to comply with cuing, though she does lose some heel strike without GT ext, which she is unable to be cued out of.                    PT Education - 10/13/19 1646    Education Details  Therex technique, HEP update    Person(s) Educated  Patient     Methods  Explanation;Demonstration;Tactile cues;Verbal cues;Handout    Comprehension  Verbalized understanding;Returned demonstration;Verbal cues required;Tactile cues required       PT Short Term Goals - 09/24/19 0942      PT SHORT TERM GOAL #1   Title  Pt will be independent with HEP in order to decrease ankle pain and increase strength in order to improve pain-free function at home and work.    Baseline  09/23/19 HEP given    Time  4    Period  Weeks    Status  New        PT Long Term Goals - 09/24/19 1000      PT LONG TERM GOAL #1   Title  Patient will demonstrate active ankle and GT ROM in order to complete gait cycle without compensation    Baseline  09/24/19 R/L 65/59 GT Ext; 70d/17d GT flex; 19/10 Ankle Dorsiflexion    Time  8    Period  Weeks    Status  New      PT LONG TERM GOAL #2   Title  Patient will demonstrate L SLS time of 14sec to demonstrate symmetry to RLE, and PLOF for balance needed for donning/doffing clothing    Baseline  09/23/19 R: 14sec L: 6sec    Time  8    Period  Weeks    Status  New      PT LONG TERM GOAL #3   Title  Patient will demonstrate L 5/5 gross GT and ankle MMT to demonstrate symmetry to RLE    Baseline  09/23/19 R/L 5/4+ with fasiculation GT ext; 5/3 GT flex; 5/4+ Metatarsal ext (tested at PIP) 2-4; 5/3 Metatarsal flx (tested at PIP) 2-4    Time  8    Period  Weeks    Status  New      PT LONG TERM GOAL #4   Title  Patient will demonstrate normalized gait in ambulation and jogging without compensation for safe return to fitness regimen    Baseline  09/23/19 Decreased arch bilat with slight pronation throughout gait; During L swing strong FT ext contraction to aid in foot clearance with slight circumduction; Decreased L foot heel strike, near L flat foot with slight foot drop at met head and toes    Time  8    Period  Weeks    Status  New            Plan - 10/13/19 1647    Clinical Impression Statement  PT led patient  through gait training for proper ankle and GT mobility, and motion through normalized gait with good understanding and compliance of cuing to normalize gait pattern. Patient with some  decreased gastroc/soleus and ant tibialis strength in various ROM, but is able to comply with all therex cuing for techniques to improve this. PT updated pt HEP, with printout given which patient demonstrates and verbalizes good understanding of. PT will continue progression as able.    Personal Factors and Comorbidities  Behavior Pattern;Fitness;Comorbidity 1;Sex    Comorbidities  HTN    Examination-Activity Limitations  Bathing;Dressing;Stand    Examination-Participation Restrictions  Cleaning;Community Activity    Stability/Clinical Decision Making  Evolving/Moderate complexity    Clinical Decision Making  Moderate    Rehab Potential  Good    PT Frequency  2x / week    PT Duration  8 weeks    PT Treatment/Interventions  ADLs/Self Care Home Management;Cryotherapy;Ultrasound;Stair training;Balance training;Dry needling;Joint Manipulations;Passive range of motion;Spinal Manipulations;Manual techniques;Patient/family education;Therapeutic exercise;Therapeutic activities;Neuromuscular re-education;Moist Heat;Gait training;DME Instruction;Functional mobility training    PT Next Visit Plan  Alan's DF strengthening    PT Home Exercise Plan  [id number] + GT Ext stretch    Consulted and Agree with Plan of Care  Patient       Patient will benefit from skilled therapeutic intervention in order to improve the following deficits and impairments:  Abnormal gait, Decreased balance, Decreased endurance, Decreased mobility, Difficulty walking, Impaired sensation, Decreased activity tolerance, Decreased strength, Improper body mechanics, Decreased range of motion, Impaired flexibility, Increased fascial restricitons, Postural dysfunction, Pain  Visit Diagnosis: No diagnosis found.     Problem List Patient Active Problem List    Diagnosis Date Noted  . 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 Depression, major, single episode, mild ([place]) 12/20/2016  . GERD (gastroesophageal reflux disease) 12/02/2015  . Insomnia 11/26/2014  . Allergic rhinitis 07/21/2014  . Panic disorder 07/21/2014  . Generalized anxiety disorder 09/08/2013  . Family history of early CAD 06/09/2013  . Reactive arthritis ([place]) 01/27/2013  . Common migraine 01/27/2013  . HTN (hypertension) 01/27/2013  . Hyperlipidemia, borderline 01/27/2013  . Hx of abnormal Pap smear 01/27/2013   Shelton Silvas PT, DPT Shelton Silvas 10/13/2019, 4:51 PM  [facility] [address], [place], [address] Phone: [phone]   Fax:  [phone]  Name: Phyllis Myers MRN: [id number] Date of Birth: 1980/11/11

## 2019-10-14 ENCOUNTER — Ambulatory Visit
Admission: RE | Admit: 2019-10-14 | Discharge: 2019-10-14 | Disposition: A | Discharge status: Home / Self Care | Payer: 59 | Source: Ambulatory Visit | Attending: Neurology | Admitting: Neurology

## 2019-10-14 DIAGNOSIS — R531 Weakness: Secondary | ICD-10-CM | POA: Insufficient documentation

## 2019-10-14 MED ORDER — GADOBUTROL 1 MMOL/ML IV SOLN
8.0000 mL | Freq: Once | INTRAVENOUS | Status: AC | PRN
  Administered 2019-10-14: 8 mL via INTRAVENOUS

## 2019-10-20 ENCOUNTER — Other Ambulatory Visit: Payer: Self-pay

## 2019-10-20 ENCOUNTER — Ambulatory Visit: Payer: 59 | Admitting: Physical Therapy

## 2019-10-20 ENCOUNTER — Encounter: Payer: Self-pay | Admitting: Physical Therapy

## 2019-10-20 DIAGNOSIS — M79675 Pain in left toe(s): Secondary | ICD-10-CM | POA: Diagnosis not present

## 2019-10-20 DIAGNOSIS — M545 Low back pain, unspecified: Secondary | ICD-10-CM

## 2019-10-20 DIAGNOSIS — R2689 Other abnormalities of gait and mobility: Secondary | ICD-10-CM

## 2019-10-20 DIAGNOSIS — G8929 Other chronic pain: Secondary | ICD-10-CM

## 2019-10-20 NOTE — Therapy (Signed)
[facility] [address], [place], [address] Phone: [phone]   Fax:  [phone]  Physical Therapy Treatment  Patient Details  Name: Phyllis Myers MRN: [id number] Date of Birth: 1981/01/25 No data recorded  Encounter Date: 10/20/2019  PT End of Session - 10/20/19 1742    Visit Number  3    Number of Visits  16    Date for PT Re-Evaluation  11/19/19    PT Start Time  0532    PT Stop Time  0615    PT Time Calculation (min)  43 min    Activity Tolerance  Patient tolerated treatment well    Behavior During Therapy  [facility] for tasks assessed/performed       Past Medical History:  Diagnosis Date  . Arthritis   . Depression, major, single episode, mild ([place]) 12/20/2016  . Hypertension     History reviewed. No pertinent surgical history.  There were no vitals filed for this visit.  Subjective Assessment - 10/20/19 1734    Subjective  Reports continued numbness in GT only with some achy pain of GT with increased walking. Reports 2/10 pain in GT today. Brain MRI negative    Pertinent History  Pt is a 39 year old female that has had toe weakness and numbness in all 5 toes since September. Reports she woke up one day and could not feel her toes or move them. Reports she has a history with LBP that is chronic and continues to have LBP despite injections a couple weeks ago. Worst LBP in past week 8/10, best 2/10, pain is midline and achy. LBP inhibits prolonged standing for 98mns to cook a meal, vacuum, laundry; reports pain is made better by flexing forward onto couch. Reports toes are not painful, just numb and immobile, although they do cramp. Reports good ankle mobility and sensation. 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 Prior to toe numbess, and LBP exacerbation  patient was running for fitness and she wants to get back to this. Reports she has difficulty with balance needed to don/doff pants, and with showering. Patient lives alone with small dog.Pt denies N/V, B&B changes, unexplained weight fluctuation, saddle paresthesia, fever, night sweats, or unrelenting night pain at this time.    How long can you sit comfortably?  unlimited    How long can you stand comfortably?  163ms    How long can you walk comfortably?  unlimited    Diagnostic tests  MRI lumbar spine: 25m59mnterolisthesis of L5 on S1 with disc bulge; Mild noncompressive disc bulging elsewhere within the lumbarspine without significant stenosis or impingement. Nerve Conduction: inclusive other than some RA inflammation (to completed again), with subsequent MRI of brain scheduled    Patient Stated Goals  Go back to running, increase balance    Pain Onset  1 to 4 weeks ago         Ther-Ex Gastroc stretch on slant board 2x 1mi47mDF with forefoot off step 2x 10 with cuing to prevent post hip translation with good carry over Heel walks 2x 40ft65fh min cuing to maintain DF without GT ext with good carry over L hip flex with DF against YTB x10; 2x 10 RTB  Reverse lunge to hip flex (with DF) 3x 8 with min  cuing for technique initially with good carry over following SL heel raise on total gym L 20 3x 10 with min cuing for full heel raise with good carry over Ball toss/catch in SLS on foam to rebounder 2x 10; x15 with better balance on final set  Gait Training 1.69mh on treadmill without grade, and with 5% grade over 531ms with good carry over of heel strike                        PT Education - 10/20/19 1741    Education Details  Therex form; gait training    Person(s) Educated  Patient    Methods  Explanation;Demonstration;Verbal cues;Tactile cues    Comprehension  Verbalized understanding;Returned demonstration;Verbal cues required;Tactile cues required       PT  Short Term Goals - 09/24/19 0942      PT SHORT TERM GOAL #1   Title  Pt will be independent with HEP in order to decrease ankle pain and increase strength in order to improve pain-free function at home and work.    Baseline  09/23/19 HEP given    Time  4    Period  Weeks    Status  New        PT Long Term Goals - 09/24/19 1000      PT LONG TERM GOAL #1   Title  Patient will demonstrate active ankle and GT ROM in order to complete gait cycle without compensation    Baseline  09/24/19 R/L 65/59 GT Ext; 70d/17d GT flex; 19/10 Ankle Dorsiflexion    Time  8    Period  Weeks    Status  New      PT LONG TERM GOAL #2   Title  Patient will demonstrate L SLS time of 14sec to demonstrate symmetry to RLE, and PLOF for balance needed for donning/doffing clothing    Baseline  09/23/19 R: 14sec L: 6sec    Time  8    Period  Weeks    Status  New      PT LONG TERM GOAL #3   Title  Patient will demonstrate L 5/5 gross GT and ankle MMT to demonstrate symmetry to RLE    Baseline  09/23/19 R/L 5/4+ with fasiculation GT ext; 5/3 GT flex; 5/4+ Metatarsal ext (tested at PIP) 2-4; 5/3 Metatarsal flx (tested at PIP) 2-4    Time  8    Period  Weeks    Status  New      PT LONG TERM GOAL #4   Title  Patient will demonstrate normalized gait in ambulation and jogging without compensation for safe return to fitness regimen    Baseline  09/23/19 Decreased arch bilat with slight pronation throughout gait; During L swing strong FT ext contraction to aid in foot clearance with slight circumduction; Decreased L foot heel strike, near L flat foot with slight foot drop at met head and toes    Time  8    Period  Weeks    Status  New            Plan - 10/20/19 1809    Clinical Impression Statement  PT continued therex progression for ankle strengthening with carry over into gait and functional movements with good success. Patient is increasing motor control between sessions with good success. PT will  continue progression as able.    Personal Factors and Comorbidities  Behavior Pattern;Fitness;Comorbidity 1;Sex    Comorbidities  HTN  Examination-Activity Limitations  Bathing;Dressing;Stand    Examination-Participation Restrictions  Cleaning;Community Activity    Stability/Clinical Decision Making  Evolving/Moderate complexity    Clinical Decision Making  Moderate    Rehab Potential  Good    PT Frequency  2x / week    PT Duration  8 weeks    PT Treatment/Interventions  ADLs/Self Care Home Management;Cryotherapy;Ultrasound;Stair training;Balance training;Dry needling;Joint Manipulations;Passive range of motion;Spinal Manipulations;Manual techniques;Patient/family education;Therapeutic exercise;Therapeutic activities;Neuromuscular re-education;Moist Heat;Gait training;DME Instruction;Functional mobility training    PT Home Exercise Plan  [id number] + GT Ext stretch    Consulted and Agree with Plan of Care  Patient       Patient will benefit from skilled therapeutic intervention in order to improve the following deficits and impairments:  Abnormal gait, Decreased balance, Decreased endurance, Decreased mobility, Difficulty walking, Impaired sensation, Decreased activity tolerance, Decreased strength, Improper body mechanics, Decreased range of motion, Impaired flexibility, Increased fascial restricitons, Postural dysfunction, Pain  Visit Diagnosis: Pain in left toe(s)  Chronic midline low back pain without sciatica  Other abnormalities of gait and mobility     Problem List Patient Active Problem List   Diagnosis Date Noted  . 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 Depression, major, single episode, mild ([place]) 12/20/2016  . GERD (gastroesophageal reflux disease) 12/02/2015  . Insomnia 11/26/2014  . Allergic rhinitis 07/21/2014  . Panic disorder 07/21/2014  . Generalized anxiety disorder 09/08/2013  . Family history of early CAD 06/09/2013  . Reactive arthritis ([place]) 01/27/2013  .  Common migraine 01/27/2013  . HTN (hypertension) 01/27/2013  . Hyperlipidemia, borderline 01/27/2013  . Hx of abnormal Pap smear 01/27/2013   Shelton Silvas PT, DPT Shelton Silvas 10/20/2019, 6:11 PM  [facility] [address] [place], [address] Phone: [phone]   Fax:  [phone]  Name: NAIJA TROOST MRN: [id number] Date of Birth: 09-29-1981

## 2019-10-22 ENCOUNTER — Ambulatory Visit: Payer: 59 | Admitting: Physical Therapy

## 2019-10-27 ENCOUNTER — Ambulatory Visit: Payer: 59 | Admitting: Physical Therapy

## 2019-10-27 ENCOUNTER — Other Ambulatory Visit: Payer: Self-pay

## 2019-10-27 ENCOUNTER — Encounter: Payer: Self-pay | Admitting: Physical Therapy

## 2019-10-27 DIAGNOSIS — R2689 Other abnormalities of gait and mobility: Secondary | ICD-10-CM

## 2019-10-27 DIAGNOSIS — M545 Low back pain, unspecified: Secondary | ICD-10-CM

## 2019-10-27 DIAGNOSIS — M79675 Pain in left toe(s): Secondary | ICD-10-CM

## 2019-10-27 DIAGNOSIS — G8929 Other chronic pain: Secondary | ICD-10-CM

## 2019-10-27 NOTE — Therapy (Addendum)
[facility] [address] [place], [address] Phone: [phone]   Fax:  [phone]  Physical Therapy Treatment  Patient Details  Name: Phyllis Myers MRN: [id number] Date of Birth: 1981/05/06 No data recorded  Encounter Date: 10/27/2019  PT End of Session - 10/27/19 1728    Visit Number  4    Number of Visits  16    Date for PT Re-Evaluation  11/19/19    PT Start Time  9622    PT Stop Time  1725    PT Time Calculation (min)  40 min    Activity Tolerance  Patient tolerated treatment well    Behavior During Therapy  [facility] for tasks assessed/performed       Past Medical History:  Diagnosis Date  . Arthritis   . Depression, major, single episode, mild ([place]) 12/20/2016  . Hypertension     History reviewed. No pertinent surgical history.  There were no vitals filed for this visit.  Subjective Assessment - 10/27/19 1644    Subjective  No numbness currently, but it will come intermittently and no pain in GT. Pt tried gait retraining at home to keep good heel strike.    Pertinent History  Pt is a 39 year old female that has had toe weakness and numbness in all 5 toes since September. Reports she woke up one day and could not feel her toes or move them. Reports she has a history with LBP that is chronic and continues to have LBP despite injections a couple weeks ago. Worst LBP in past week 8/10, best 2/10, pain is midline and achy. LBP inhibits prolonged standing for 39mns to cook a meal, vacuum, laundry; reports pain is made better by flexing forward onto couch. Reports toes are not painful, just numb and immobile, although they do cramp. Reports good ankle mobility and sensation. 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 Prior to toe numbess, and LBP exacerbation  patient was running for fitness and she wants to get back to this. Reports she has difficulty with balance needed to don/doff pants, and with showering. Patient lives alone with small dog.Pt denies N/V, B&B changes, unexplained weight fluctuation, saddle paresthesia, fever, night sweats, or unrelenting night pain at this time.    How long can you sit comfortably?  unlimited    How long can you stand comfortably?  142ms    How long can you walk comfortably?  unlimited    Diagnostic tests  MRI lumbar spine: 86m41mnterolisthesis of L5 on S1 with disc bulge; Mild noncompressive disc bulging elsewhere within the lumbarspine without significant stenosis or impingement. Nerve Conduction: inclusive other than some RA inflammation (to completed again), with subsequent MRI of brain scheduled    Patient Stated Goals  Go back to running, increase balance       Gait Training 1.2mp14mn treadmill without grade, and with 5% grade over 86min72mith good carry over of heel strike    THEREX -standing gastroc stretch on stair x10 sec alternating  -heel raise on step 3x10 with heavy cueing for pushing heels up higher to go on toes good carryover  -Wobble board with DF/PF 3x10; pt good carryover with  -Forward walking lunges with 4# DB 3x6 ea leg  -Heel tap to toe tap over hurdle  with RTB with heavy cueing to keep from pulling toe up with DF and prevent circumduction; with pull laterally to keep from circumducting  -SL balance on airex pad for x30 secs; good carryover for form  -curling toes into flexion over towel with DF pumps 2x10; pt reported some difficulty with the digit flexion, but was able to control with more practice and concentration                        PT Education - 10/27/19 1727    Education Details  therex form, gait training    Person(s) Educated  Patient    Methods  Explanation;Tactile cues;Demonstration    Comprehension  Verbalized understanding       PT Short Term  Goals - 09/24/19 0942      PT SHORT TERM GOAL #1   Title  Pt will be independent with HEP in order to decrease ankle pain and increase strength in order to improve pain-free function at home and work.    Baseline  09/23/19 HEP given    Time  4    Period  Weeks    Status  New        PT Long Term Goals - 09/24/19 1000      PT LONG TERM GOAL #1   Title  Patient will demonstrate active ankle and GT ROM in order to complete gait cycle without compensation    Baseline  09/24/19 R/L 65/59 GT Ext; 70d/17d GT flex; 19/10 Ankle Dorsiflexion    Time  8    Period  Weeks    Status  New      PT LONG TERM GOAL #2   Title  Patient will demonstrate L SLS time of 14sec to demonstrate symmetry to RLE, and PLOF for balance needed for donning/doffing clothing    Baseline  09/23/19 R: 14sec L: 6sec    Time  8    Period  Weeks    Status  New      PT LONG TERM GOAL #3   Title  Patient will demonstrate L 5/5 gross GT and ankle MMT to demonstrate symmetry to RLE    Baseline  09/23/19 R/L 5/4+ with fasiculation GT ext; 5/3 GT flex; 5/4+ Metatarsal ext (tested at PIP) 2-4; 5/3 Metatarsal flx (tested at PIP) 2-4    Time  8    Period  Weeks    Status  New      PT LONG TERM GOAL #4   Title  Patient will demonstrate normalized gait in ambulation and jogging without compensation for safe return to fitness regimen    Baseline  09/23/19 Decreased arch bilat with slight pronation throughout gait; During L swing strong FT ext contraction to aid in foot clearance with slight circumduction; Decreased L foot heel strike, near L flat foot with slight foot drop at met head and toes    Time  8    Period  Weeks    Status  New            Plan - 10/27/19 1728    Clinical Impression Statement  PT progressed SL motor control, strength and balance with good carryover from pt into gait. Pt was able to correct form on most exercises, but still needed cueing for not using the great toe extension for DF. PT will  continue with progression and add more motor contorl as needed.    Personal Factors and Comorbidities  Behavior Pattern;Fitness;Comorbidity 1;Sex  Comorbidities  HTN    Examination-Activity Limitations  Bathing;Dressing;Stand    Examination-Participation Restrictions  Cleaning;Community Activity    Stability/Clinical Decision Making  Evolving/Moderate complexity    Clinical Decision Making  Moderate    Rehab Potential  Good    PT Frequency  2x / week    PT Duration  8 weeks    PT Treatment/Interventions  ADLs/Self Care Home Management;Cryotherapy;Ultrasound;Stair training;Balance training;Dry needling;Joint Manipulations;Passive range of motion;Spinal Manipulations;Manual techniques;Patient/family education;Therapeutic exercise;Therapeutic activities;Neuromuscular re-education;Moist Heat;Gait training;DME Instruction;Functional mobility training    PT Next Visit Plan  SL balance and strength    PT Home Exercise Plan  [id number] + GT Ext stretch       Patient will benefit from skilled therapeutic intervention in order to improve the following deficits and impairments:  Abnormal gait, Decreased balance, Decreased endurance, Decreased mobility, Difficulty walking, Impaired sensation, Decreased activity tolerance, Decreased strength, Improper body mechanics, Decreased range of motion, Impaired flexibility, Increased fascial restricitons, Postural dysfunction, Pain  Visit Diagnosis: Pain in left toe(s)  Chronic midline low back pain without sciatica  Other abnormalities of gait and mobility     Problem List Patient Active Problem List   Diagnosis Date Noted  . 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 Depression, major, single episode, mild ([place]) 12/20/2016  . GERD (gastroesophageal reflux disease) 12/02/2015  . Insomnia 11/26/2014  . Allergic rhinitis 07/21/2014  . Panic disorder 07/21/2014  . Generalized anxiety disorder 09/08/2013  . Family history of early CAD 06/09/2013  . Reactive  arthritis ([place]) 01/27/2013  . Common migraine 01/27/2013  . HTN (hypertension) 01/27/2013  . Hyperlipidemia, borderline 01/27/2013  . Hx of abnormal Pap smear 01/27/2013   Shelton Silvas PT, DPT Ivin Booty, SPT Shelton Silvas 10/27/2019, 5:46 PM  [facility] [address], [place], [address] Phone: [phone]   Fax:  [phone]  Name: Phyllis Myers MRN: [id number] Date of Birth: Feb 17, 1981

## 2019-10-29 ENCOUNTER — Ambulatory Visit: Payer: 59 | Admitting: Physical Therapy

## 2019-11-02 ENCOUNTER — Other Ambulatory Visit: Payer: Self-pay

## 2019-11-02 ENCOUNTER — Encounter: Payer: Self-pay | Admitting: Physical Therapy

## 2019-11-02 ENCOUNTER — Ambulatory Visit: Payer: 59 | Attending: Rheumatology | Admitting: Physical Therapy

## 2019-11-02 DIAGNOSIS — M79675 Pain in left toe(s): Secondary | ICD-10-CM | POA: Diagnosis present

## 2019-11-02 DIAGNOSIS — R2689 Other abnormalities of gait and mobility: Secondary | ICD-10-CM | POA: Diagnosis present

## 2019-11-02 NOTE — Therapy (Cosign Needed Addendum)
[facility] [address] [place], [address] Phone: [phone]   Fax:  [phone]  Physical Therapy Treatment  Patient Details  Name: Phyllis Myers MRN: [id number] Date of Birth: 1980-12-12 No data recorded  Encounter Date: 11/02/2019  PT End of Session - 11/02/19 1652    Visit Number  5    Number of Visits  16    Date for PT Re-Evaluation  11/19/19    PT Start Time  3846    PT Stop Time  1648    PT Time Calculation (min)  43 min    Activity Tolerance  Patient tolerated treatment well    Behavior During Therapy  [facility] for tasks assessed/performed       Past Medical History:  Diagnosis Date  . Arthritis   . Depression, major, single episode, mild ([place]) 12/20/2016  . Hypertension     History reviewed. No pertinent surgical history.  There were no vitals filed for this visit.  Subjective Assessment - 11/02/19 1603    Subjective  No numbness currently, but still there consistnetly. Pt had soreness in anterior tibialis that lasted 3 days. Pt states exercises are going well and doing what she can (still having difficulty with towel curls).    Pertinent History  Pt is a 39 year old female that has had toe weakness and numbness in all 5 toes since September. Reports she woke up one day and could not feel her toes or move them. Reports she has a history with LBP that is chronic and continues to have LBP despite injections a couple weeks ago. Worst LBP in past week 8/10, best 2/10, pain is midline and achy. LBP inhibits prolonged standing for 68mns to cook a meal, vacuum, laundry; reports pain is made better by flexing forward onto couch. Reports toes are not painful, just numb and immobile, although they do cramp. Reports good ankle mobility and sensation. 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 Prior to toe numbess, and LBP exacerbation patient was running for fitness and she wants to get back to this. Reports she has difficulty with balance needed to don/doff pants, and with showering. Patient lives alone with small dog.Pt denies N/V, B&B changes, unexplained weight fluctuation, saddle paresthesia, fever, night sweats, or unrelenting night pain at this time.    How long can you sit comfortably?  unlimited    How long can you stand comfortably?  183ms    How long can you walk comfortably?  unlimited    Diagnostic tests  MRI lumbar spine: 33m84mnterolisthesis of L5 on S1 with disc bulge; Mild noncompressive disc bulging elsewhere within the lumbarspine without significant stenosis or impingement. Nerve Conduction: inclusive other than some RA inflammation (to completed again), with subsequent MRI of brain scheduled    Patient Stated Goals  Go back to running, increase balance         Gait Training 1.8 mph on treadmill without grade, and with 5% grade [id number] carry over of heel strike   THEREX -Standing anterior tib stretch 2x30 sec  -Wobble board with DF/PF 3x8/10 with good carryover for control in both directions -Forward walking lunges with 5# DB 3x6 -Toe walking 250 feet around gym; some difficulty staying on forefoot -Walking on heels for 200 feet around gym;  pt states feels easy; PT notes better control to keep ankle in DF -Standing balance on BOSU flat surface for 3x45 sec with better motor control on 3 sets -Forward heel strike to  backward toe push-off over balance stone 2x8 bilat   MANTHER -Posterior mob for DF 4x30 sec bouts to increase DF  -PROM for stretching anterior tib 3x15sec -PROM in DF 2x30 sec hold  Pt states she feels better and decreased soreness pain at the ankle after manual                         PT Education - 11/02/19 1652    Education Details  therex form    Person(s) Educated   Patient    Methods  Explanation;Demonstration    Comprehension  Verbalized understanding       PT Short Term Goals - 09/24/19 0942      PT SHORT TERM GOAL #1   Title  Pt will be independent with HEP in order to decrease ankle pain and increase strength in order to improve pain-free function at home and work.    Baseline  09/23/19 HEP given    Time  4    Period  Weeks    Status  New        PT Long Term Goals - 09/24/19 1000      PT LONG TERM GOAL #1   Title  Patient will demonstrate active ankle and GT ROM in order to complete gait cycle without compensation    Baseline  09/24/19 R/L 65/59 GT Ext; 70d/17d GT flex; 19/10 Ankle Dorsiflexion    Time  8    Period  Weeks    Status  New      PT LONG TERM GOAL #2   Title  Patient will demonstrate L SLS time of 14sec to demonstrate symmetry to RLE, and PLOF for balance needed for donning/doffing clothing    Baseline  09/23/19 R: 14sec L: 6sec    Time  8    Period  Weeks    Status  New      PT LONG TERM GOAL #3   Title  Patient will demonstrate L 5/5 gross GT and ankle MMT to demonstrate symmetry to RLE    Baseline  09/23/19 R/L 5/4+ with fasiculation GT ext; 5/3 GT flex; 5/4+ Metatarsal ext (tested at PIP) 2-4; 5/3 Metatarsal flx (tested at PIP) 2-4    Time  8    Period  Weeks    Status  New      PT LONG TERM GOAL #4   Title  Patient will demonstrate normalized gait in ambulation and jogging without compensation for safe return to fitness regimen    Baseline  09/23/19 Decreased arch bilat with slight pronation throughout gait; During L swing strong FT ext contraction to aid in foot clearance with slight circumduction; Decreased L foot heel strike, near L flat foot with slight foot drop at met head and toes    Time  8    Period  Weeks    Status  New             Plan - 11/02/19 1649    Clinical Impression Statement  PT added more dynamic motions for increasing stability of LE. PT increased difficulty with balance  exercises with decent carryover from pt, but more work is needed. PT notes better motor control as pt performs more reps and sets. Pt is making progress towards goal  of increasing sensation and strength. PT will continue with progression for strength and balance as needed.    Personal Factors and Comorbidities  Behavior Pattern;Fitness;Comorbidity 1;Sex    Comorbidities  HTN    Examination-Activity Limitations  Bathing;Dressing;Stand    Examination-Participation Restrictions  Cleaning;Community Activity    Stability/Clinical Decision Making  Evolving/Moderate complexity    Clinical Decision Making  Moderate    Rehab Potential  Good    PT Frequency  2x / week    PT Duration  8 weeks    PT Treatment/Interventions  ADLs/Self Care Home Management;Cryotherapy;Ultrasound;Stair training;Balance training;Dry needling;Joint Manipulations;Passive range of motion;Spinal Manipulations;Manual techniques;Patient/family education;Therapeutic exercise;Therapeutic activities;Neuromuscular re-education;Moist Heat;Gait training;DME Instruction;Functional mobility training    PT Next Visit Plan  SL balance and strength    PT Home Exercise Plan  [id number] + GT Ext stretch    Consulted and Agree with Plan of Care  Patient       Patient will benefit from skilled therapeutic intervention in order to improve the following deficits and impairments:  Abnormal gait, Decreased balance, Decreased endurance, Decreased mobility, Difficulty walking, Impaired sensation, Decreased activity tolerance, Decreased strength, Improper body mechanics, Decreased range of motion, Impaired flexibility, Increased fascial restricitons, Postural dysfunction, Pain  Visit Diagnosis: Pain in left toe(s)  Other abnormalities of gait and mobility     Problem List Patient Active Problem List   Diagnosis Date Noted  . 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 Depression, major, single episode, mild ([place]) 12/20/2016  . GERD (gastroesophageal  reflux disease) 12/02/2015  . Insomnia 11/26/2014  . Allergic rhinitis 07/21/2014  . Panic disorder 07/21/2014  . Generalized anxiety disorder 09/08/2013  . Family history of early CAD 06/09/2013  . Reactive arthritis ([place]) 01/27/2013  . Common migraine 01/27/2013  . HTN (hypertension) 01/27/2013  . Hyperlipidemia, borderline 01/27/2013  . Hx of abnormal Pap smear 01/27/2013    Shelton Silvas PT, DPT Ivin Booty, SPT Shelton Silvas 11/02/2019, 4:59 PM  [facility] [address], [place], [address] Phone: [phone]   Fax:  [phone]  Name: Phyllis Myers MRN: [id number] Date of Birth: 07-Nov-1980

## 2019-11-04 ENCOUNTER — Ambulatory Visit: Payer: 59 | Admitting: Physical Therapy

## 2019-11-04 ENCOUNTER — Other Ambulatory Visit: Payer: Self-pay

## 2019-11-04 ENCOUNTER — Encounter: Payer: Self-pay | Admitting: Physical Therapy

## 2019-11-04 DIAGNOSIS — M79675 Pain in left toe(s): Secondary | ICD-10-CM | POA: Diagnosis not present

## 2019-11-04 DIAGNOSIS — R2689 Other abnormalities of gait and mobility: Secondary | ICD-10-CM

## 2019-11-04 NOTE — Therapy (Cosign Needed Addendum)
[facility] [address] [place], [address] Phone: [phone]   Fax:  [phone]  Physical Therapy Treatment  Patient Details  Name: Phyllis Myers MRN: [id number] Date of Birth: 04-26-1981 No data recorded  Encounter Date: 11/04/2019  PT End of Session - 11/04/19 1627    Visit Number  6    Number of Visits  16    Date for PT Re-Evaluation  11/19/19    PT Start Time  0962    PT Stop Time  1650    PT Time Calculation (min)  40 min    Activity Tolerance  Patient tolerated treatment well    Behavior During Therapy  [facility] for tasks assessed/performed       Past Medical History:  Diagnosis Date  . Arthritis   . Depression, major, single episode, mild ([place]) 12/20/2016  . Hypertension     History reviewed. No pertinent surgical history.  There were no vitals filed for this visit.  Subjective Assessment - 11/04/19 1611    Subjective  Pt has some soreness in the great toe with some numbess. No soreness in anterior tib today. Pt HEP has been going well.    Pertinent History  Pt is a 39 year old female that has had toe weakness and numbness in all 5 toes since September. Reports she woke up one day and could not feel her toes or move them. Reports she has a history with LBP that is chronic and continues to have LBP despite injections a couple weeks ago. Worst LBP in past week 8/10, best 2/10, pain is midline and achy. LBP inhibits prolonged standing for 64mns to cook a meal, vacuum, laundry; reports pain is made better by flexing forward onto couch. Reports toes are not painful, just numb and immobile, although they do cramp. Reports good ankle mobility and sensation. 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 Prior to toe numbess, and LBP exacerbation patient was  running for fitness and she wants to get back to this. Reports she has difficulty with balance needed to don/doff pants, and with showering. Patient lives alone with small dog.Pt denies N/V, B&B changes, unexplained weight fluctuation, saddle paresthesia, fever, night sweats, or unrelenting night pain at this time.    How long can you sit comfortably?  unlimited    How long can you stand comfortably?  182ms    How long can you walk comfortably?  unlimited    Diagnostic tests  MRI lumbar spine: 59m59mnterolisthesis of L5 on S1 with disc bulge; Mild noncompressive disc bulging elsewhere within the lumbarspine without significant stenosis or impingement. Nerve Conduction: inclusive other than some RA inflammation (to completed again), with subsequent MRI of brain scheduled    Patient Stated Goals  Go back to running, increase balance      THEREX -Treadmill walking 2.4 mph for 5 mins -Squat to toe raise 1x10, 5# 2x10; some trouble maintaining balance, but able to correct with more reps  -Standing ball (0.2 kg) toss on foam for balance 3x8; pt was able to catch on L side up to 5 times; R and L are equally impaired for balance -R step up to drive on small box [id number] cueing to keep heel down on box as long as possible to maximize DF with  good carryover for heel strike -Standing march on high mat table with hip drive 3x8 ea leg -Downward dog 3x15 sec hold for mobility and maintaining DF range                         PT Education - 11/04/19 1613    Education Details  therex form    Person(s) Educated  Patient    Methods  Explanation;Demonstration;Tactile cues    Comprehension  Verbalized understanding       PT Short Term Goals - 09/24/19 0942      PT SHORT TERM GOAL #1   Title  Pt will be independent with HEP in order to decrease ankle pain and increase strength in order to improve pain-free function at home and work.    Baseline  09/23/19 HEP given    Time  4    Period   Weeks    Status  New        PT Long Term Goals - 09/24/19 1000      PT LONG TERM GOAL #1   Title  Patient will demonstrate active ankle and GT ROM in order to complete gait cycle without compensation    Baseline  09/24/19 R/L 65/59 GT Ext; 70d/17d GT flex; 19/10 Ankle Dorsiflexion    Time  8    Period  Weeks    Status  New      PT LONG TERM GOAL #2   Title  Patient will demonstrate L SLS time of 14sec to demonstrate symmetry to RLE, and PLOF for balance needed for donning/doffing clothing    Baseline  09/23/19 R: 14sec L: 6sec    Time  8    Period  Weeks    Status  New      PT LONG TERM GOAL #3   Title  Patient will demonstrate L 5/5 gross GT and ankle MMT to demonstrate symmetry to RLE    Baseline  09/23/19 R/L 5/4+ with fasiculation GT ext; 5/3 GT flex; 5/4+ Metatarsal ext (tested at PIP) 2-4; 5/3 Metatarsal flx (tested at PIP) 2-4    Time  8    Period  Weeks    Status  New      PT LONG TERM GOAL #4   Title  Patient will demonstrate normalized gait in ambulation and jogging without compensation for safe return to fitness regimen    Baseline  09/23/19 Decreased arch bilat with slight pronation throughout gait; During L swing strong FT ext contraction to aid in foot clearance with slight circumduction; Decreased L foot heel strike, near L flat foot with slight foot drop at met head and toes    Time  8    Period  Weeks    Status  New            Plan - 11/04/19 1639    Clinical Impression Statement  PT progressed LE strength therex with good carryover from pt. Pt has made improvments with motor pattern and coordination. PT improved dynamic balance from last session and is making good progress towards improving strength. PT will continue with progressions as needed.    Personal Factors and Comorbidities  Behavior Pattern;Fitness;Comorbidity 1;Sex    Comorbidities  HTN    Examination-Activity Limitations  Bathing;Dressing;Stand    Examination-Participation Restrictions   Cleaning;Community Activity    Stability/Clinical Decision Making  Evolving/Moderate complexity    Clinical Decision Making  Moderate    Rehab Potential  Good  PT Frequency  2x / week    PT Duration  8 weeks    PT Treatment/Interventions  ADLs/Self Care Home Management;Cryotherapy;Ultrasound;Stair training;Balance training;Dry needling;Joint Manipulations;Passive range of motion;Spinal Manipulations;Manual techniques;Patient/family education;Therapeutic exercise;Therapeutic activities;Neuromuscular re-education;Moist Heat;Gait training;DME Instruction;Functional mobility training    PT Next Visit Plan  SL balance and strength    PT Home Exercise Plan  [id number] + GT Ext stretch    Consulted and Agree with Plan of Care  Patient       Patient will benefit from skilled therapeutic intervention in order to improve the following deficits and impairments:  Abnormal gait, Decreased balance, Decreased endurance, Decreased mobility, Difficulty walking, Impaired sensation, Decreased activity tolerance, Decreased strength, Improper body mechanics, Decreased range of motion, Impaired flexibility, Increased fascial restricitons, Postural dysfunction, Pain  Visit Diagnosis: Pain in left toe(s)  Other abnormalities of gait and mobility     Problem List Patient Active Problem List   Diagnosis Date Noted  . 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 Depression, major, single episode, mild ([place]) 12/20/2016  . GERD (gastroesophageal reflux disease) 12/02/2015  . Insomnia 11/26/2014  . Allergic rhinitis 07/21/2014  . Panic disorder 07/21/2014  . Generalized anxiety disorder 09/08/2013  . Family history of early CAD 06/09/2013  . Reactive arthritis ([place]) 01/27/2013  . Common migraine 01/27/2013  . HTN (hypertension) 01/27/2013  . Hyperlipidemia, borderline 01/27/2013  . Hx of abnormal Pap smear 01/27/2013   Shelton Silvas PT, DPT Ivin Booty, SPT Shelton Silvas 11/05/2019, 11:00 AM  [facility] [address], [place], [address] Phone: [phone]   Fax:  [phone]  Name: Phyllis Myers MRN: [id number] Date of Birth: 1981/01/20

## 2019-11-11 ENCOUNTER — Encounter: Payer: 59 | Admitting: Family Medicine

## 2019-11-11 NOTE — Progress Notes (Deleted)
Vonita Calloway T. Tomislav Micale, MD Primary Care and Sports Medicine [facility] [address] [place], [address] Phone: [phone]  FAX: [phone]  DENIYA CRAIGO - 39 y.o. female  MRN [id number]  Date of Birth: Apr 23, 1981  Visit Date: 11/11/2019  PCP: [name], MD  Referred by: [name], MD  No chief complaint on file.   This visit occurred during the SARS-CoV-2 public health emergency.  Safety protocols were in place, including screening questions prior to the visit, additional usage of staff PPE, and extensive cleaning of exam room while observing appropriate contact time as indicated for disinfecting solutions.   Patient Care Team: [name], MD as PCP - General (Family Medicine) Subjective:   Phyllis Myers is a 39 y.o. pleasant patient who presents with the following:  Health Maintenance Summary Reviewed and updated, unless pt declines services.  Tobacco History Reviewed. Non-smoker Alcohol: No concerns, no excessive use Exercise Habits: Some activity, rec at least 30 mins 5 times a week STD concerns: none Drug Use: None Lumps or breast concerns: no  TDAP  Health Maintenance  Topic Date Due  . HIV Screening  07/14/1994  . TETANUS/TDAP  07/14/1998  . INFLUENZA VACCINE  05/02/2019  . PAP SMEAR-Modifier  04/24/2020     There is no immunization history on file for this patient. Patient Active Problem List   Diagnosis Date Noted  . 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 Panic disorder 07/21/2014    Priority: Medium  . Generalized anxiety disorder 09/08/2013    Priority: Medium  . 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 Depression, major, single episode, mild ([facility]) 12/20/2016  . GERD (gastroesophageal reflux disease) 12/02/2015  . Insomnia 11/26/2014  . Allergic rhinitis 07/21/2014  . Family history of early CAD 06/09/2013  . Common migraine 01/27/2013  . HTN (hypertension)  01/27/2013  . Hyperlipidemia, borderline 01/27/2013  . Hx of abnormal Pap smear 01/27/2013    Past Medical History:  Diagnosis Date  . Arthritis   . Depression, major, single episode, mild ([facility]) 12/20/2016  . Hypertension     No past surgical history on file.  Family History  Problem Relation Age of Onset  . Pulmonary Hypertension Mother   . Cardiomyopathy Mother   . Heart disease Mother   . Heart disease Father 21       MI  . Heart disease Maternal Grandfather   . Heart disease Paternal Grandfather     Past Medical History, Surgical History, Social History, Family History, Problem List, Medications, and Allergies have been reviewed and updated if relevant.  Review of Systems: Pertinent positives are listed above.  Otherwise, a full 14 point review of systems has been done in full and it is negative except where it is noted positive.  Objective:   There were no vitals taken for this visit. Ideal Body Weight:   No exam data present Depression screen [facility] 2/9 02/17/2018  Decreased Interest 0  Down, Depressed, Hopeless 0  PHQ - 2 Score 0     GEN: well developed, well nourished, no acute distress Eyes: conjunctiva and lids normal, PERRLA, EOMI ENT: TM clear, nares clear, oral exam WNL Neck: supple, no lymphadenopathy, no thyromegaly, no JVD Pulm: clear to auscultation and percussion, respiratory effort normal CV: regular rate and rhythm, S1-S2, no murmur, rub or gallop, no bruits Chest: no scars, masses, no lumps BREAST: breast exam declined GI: soft, non-tender; no hepatosplenomegaly, masses; active bowel sounds  all quadrants GU: GU exam declined Lymph: no cervical, axillary or inguinal adenopathy MSK: gait normal, muscle tone and strength WNL, no joint swelling, effusions, discoloration, crepitus  SKIN: clear, good turgor, color WNL, no rashes, lesions, or ulcerations Neuro: normal mental status, normal strength, sensation, and motion Psych: alert; oriented to  person, place and time, normally interactive and not anxious or depressed in appearance.   All labs reviewed with patient. Results for orders placed or performed in visit on 05/15/19  Iron and TIBC  Result Value Ref Range   Iron 166 28 - 170 ug/dL   TIBC 203 559 - 741 ug/dL   Saturation Ratios 43 (H) 10.4 - 31.8 %   UIBC 224 ug/dL  CBC with Differential/Platelet  Result Value Ref Range   WBC 6.2 4.0 - 10.5 K/uL   RBC 4.31 3.87 - 5.11 MIL/uL   Hemoglobin 15.3 (H) 12.0 - 15.0 g/dL   HCT 63.8 45.3 - 64.6 %   MCV 98.4 80.0 - 100.0 fL   MCH 35.5 (H) 26.0 - 34.0 pg   MCHC 36.1 (H) 30.0 - 36.0 g/dL   RDW 80.3 (L) 21.2 - 24.8 %   Platelets 192 150 - 400 K/uL   nRBC 0.0 0.0 - 0.2 %   Neutrophils Relative % 72 %   Neutro Abs 4.4 1.7 - 7.7 K/uL   Lymphocytes Relative 17 %   Lymphs Abs 1.1 0.7 - 4.0 K/uL   Monocytes Relative 9 %   Monocytes Absolute 0.6 0.1 - 1.0 K/uL   Eosinophils Relative 1 %   Eosinophils Absolute 0.1 0.0 - 0.5 K/uL   Basophils Relative 1 %   Basophils Absolute 0.1 0.0 - 0.1 K/uL   Immature Granulocytes 0 %   Abs Immature Granulocytes 0.02 0.00 - 0.07 K/uL  Ferritin  Result Value Ref Range   Ferritin 633 (H) 11 - 307 ng/mL    Assessment and Plan:     ICD-10-CM   1. Healthcare maintenance  Z00.00     Health Maintenance Exam: The patient's preventative maintenance and recommended screening tests for an annual wellness exam were reviewed in full today. Brought up to date unless services declined.  Counselled on the importance of diet, exercise, and its role in overall health and mortality. The patient's FH and SH was reviewed, including their home life, tobacco status, and drug and alcohol status.  Follow-up in 1 year for physical exam or additional follow-up below.  Follow-up: No follow-ups on file. Or follow-up in 1 year if not noted.  Future Appointments  Date Time Provider Department Center  11/11/2019  2:40 PM Alegra Rost, [name], MD [facility]     No orders of the defined types were placed in this encounter.  There are no discontinued medications. No orders of the defined types were placed in this encounter.   Signed,  Elpidio Galea. Kayson Bullis, MD   Allergies as of 11/11/2019      Reactions   Amoxicillin    Muscle pain   Sulfasalazine    Other reaction(s): Headache   Naproxen Sodium Rash      Medication List       Accurate as of November 11, 2019  8:54 AM. If you have any questions, ask your nurse or doctor.        albuterol 108 (90 Base) MCG/ACT inhaler Commonly known as: VENTOLIN HFA INHALE 2 PUFFS INTO THE LUNGS EVERY 6 HOURS AS NEEDED FOR WHEEZING OR SHORTNESS OF BREATH   diazepam 5 MG tablet  Commonly known as: VALIUM Take 1 tablet (5 mg total) by mouth every 8 (eight) hours as needed for anxiety.   Enbrel SureClick 50 MG/ML injection Generic drug: etanercept Inject 50 mg into the skin once a week.   etodolac 200 MG capsule Commonly known as: LODINE Take 400 mg by mouth 2 (two) times daily.   hydrochlorothiazide 25 MG tablet Commonly known as: HYDRODIURIL Take by mouth.   levonorgestrel 20 MCG/24HR IUD Commonly known as: MIRENA 1 each by Intrauterine route once.   omeprazole 40 MG capsule Commonly known as: PRILOSEC TAKE 1 CAPSULE(40 MG) BY MOUTH DAILY

## 2019-12-04 ENCOUNTER — Other Ambulatory Visit: Payer: Self-pay | Admitting: Neurosurgery

## 2019-12-10 ENCOUNTER — Other Ambulatory Visit: Payer: Self-pay

## 2019-12-10 ENCOUNTER — Other Ambulatory Visit
Admission: RE | Admit: 2019-12-10 | Discharge: 2019-12-10 | Disposition: A | Discharge status: Home / Self Care | Payer: 59 | Source: Ambulatory Visit | Attending: Neurosurgery | Admitting: Neurosurgery

## 2019-12-10 ENCOUNTER — Other Ambulatory Visit: Payer: 59

## 2019-12-10 DIAGNOSIS — Z01812 Encounter for preprocedural laboratory examination: Secondary | ICD-10-CM | POA: Insufficient documentation

## 2019-12-10 NOTE — Patient Instructions (Signed)
Your procedure is scheduled on: Monday December 14, 2019 Report to Day Surgery. To find out your arrival time please call [phone] between 1PM - 3PM on Friday December 11, 2019.  Remember: Instructions that are not followed completely may result in serious medical risk,  up to and including death, or upon the discretion of your surgeon and anesthesiologist your  surgery may need to be rescheduled.     _X__ 1. Do not eat food after midnight the night before your procedure.                 No gum chewing or hard candies. You may drink clear liquids up to 2 hours                 before you are scheduled to arrive for your surgery- DO not drink clear                 liquids within 2 hours of the start of your surgery.                 Clear Liquids include:  water, apple juice without pulp, clear Gatorade, G2 or                  Gatorade Zero (avoid Red/Purple/Blue), Black Coffee or Tea (Do not add                 anything to coffee or tea). NO sodas or carbonated drinks.  __X__2.  On the morning of surgery brush your teeth with toothpaste and water, you                may rinse your mouth with mouthwash if you wish.  Do not swallow any toothpaste of mouthwash.     _X__ 3.  No Alcohol for 24 hours before or after surgery.   _X__ 4.  Do Not Smoke or use e-cigarettes For 24 Hours Prior to Your Surgery.                 Do not use any chewable tobacco products for at least 6 hours prior to                 surgery.  __x__  6.  Notify your doctor if there is any change in your medical condition      (cold, fever, infections).     Do not wear jewelry, make-up, hairpins, clips or nail polish. Do not wear lotions, powders, or perfumes. You may wear deodorant. Do not shave 48 hours prior to surgery. Men may shave face and neck. Do not bring valuables to the hospital.    [facility] is not responsible for any belongings or valuables.  Contacts, dentures or bridgework may not  be worn into surgery. Leave your suitcase in the car. After surgery it may be brought to your room. For patients admitted to the hospital, discharge time is determined by your treatment team.   Patients discharged the day of surgery will not be allowed to drive home.   Make arrangements for someone to be with you for the first 24 hours of your Same Day Discharge.   __x__ Take these medicines the morning of surgery with A SIP OF WATER:    1. omeprazole (PRILOSEC)   __x__ Use CHG Soap as directed  __x__ Stop Anti-inflammatories such as etodolac (LODINE), ibuprofen, aspirin and or BC powders.   __x__ Stop supplements until after surgery.    __x__ Drucilla Schmidt  not add any herbal supplements before your surgery.

## 2019-12-11 ENCOUNTER — Encounter
Admission: RE | Admit: 2019-12-11 | Discharge: 2019-12-11 | Disposition: A | Discharge status: Home / Self Care | Payer: 59 | Source: Ambulatory Visit | Attending: Neurosurgery | Admitting: Neurosurgery

## 2019-12-11 DIAGNOSIS — Z20822 Contact with and (suspected) exposure to covid-19: Secondary | ICD-10-CM | POA: Insufficient documentation

## 2019-12-11 DIAGNOSIS — Z01812 Encounter for preprocedural laboratory examination: Secondary | ICD-10-CM | POA: Diagnosis present

## 2019-12-11 LAB — CBC
HCT: 39.5 % (ref 36.0–46.0)
Hemoglobin: 14.2 g/dL (ref 12.0–15.0)
MCH: 36.5 pg — ABNORMAL HIGH (ref 26.0–34.0)
MCHC: 35.9 g/dL (ref 30.0–36.0)
MCV: 101.5 fL — ABNORMAL HIGH (ref 80.0–100.0)
Platelets: 136 10*3/uL — ABNORMAL LOW (ref 150–400)
RBC: 3.89 MIL/uL (ref 3.87–5.11)
RDW: 11.7 % (ref 11.5–15.5)
WBC: 4.1 10*3/uL (ref 4.0–10.5)
nRBC: 0 % (ref 0.0–0.2)

## 2019-12-11 LAB — URINALYSIS, ROUTINE W REFLEX MICROSCOPIC
Bilirubin Urine: NEGATIVE
Glucose, UA: NEGATIVE mg/dL
Hgb urine dipstick: NEGATIVE
Ketones, ur: 5 mg/dL — AB
Leukocytes,Ua: NEGATIVE
Nitrite: NEGATIVE
Protein, ur: NEGATIVE mg/dL
Specific Gravity, Urine: 1.01 (ref 1.005–1.030)
pH: 5 (ref 5.0–8.0)

## 2019-12-11 LAB — SURGICAL PCR SCREEN
MRSA, PCR: POSITIVE — AB
Staphylococcus aureus: POSITIVE — AB

## 2019-12-11 LAB — BASIC METABOLIC PANEL
Anion gap: 11 (ref 5–15)
BUN: <5 mg/dL — ABNORMAL LOW (ref 6–20)
CO2: 26 mmol/L (ref 22–32)
Calcium: 8.7 mg/dL — ABNORMAL LOW (ref 8.9–10.3)
Chloride: 99 mmol/L (ref 98–111)
Creatinine, Ser: 0.54 mg/dL (ref 0.44–1.00)
GFR calc Af Amer: >60 mL/min (ref >60)
GFR calc non Af Amer: >60 mL/min (ref >60)
Glucose, Bld: 110 mg/dL — ABNORMAL HIGH (ref 70–99)
Potassium: 3.3 mmol/L — ABNORMAL LOW (ref 3.5–5.1)
Sodium: 136 mmol/L (ref 135–145)

## 2019-12-11 LAB — TYPE AND SCREEN
ABO/RH(D): A POS
Antibody Screen: NEGATIVE

## 2019-12-11 LAB — PROTIME-INR
INR: 1 (ref 0.8–1.2)
Prothrombin Time: 13 seconds (ref 11.4–15.2)

## 2019-12-11 LAB — APTT: aPTT: 26 seconds (ref 24–36)

## 2019-12-11 NOTE — Pre-Procedure Instructions (Signed)
Resent abnormal labs, but then found new orders on fax machine.

## 2019-12-11 NOTE — Pre-Procedure Instructions (Addendum)
Called Kendelyn at Dr Shiela Mayer office and notified her of low K+, + MRSA and SA, and abnormal UA-I informed Kendelyn to fax over to [facility] any orders that Dr Adriana Simas may have since chart is already over in SDS. Faxed results to [facility] and gave her SDS fax #. Royston Cowper stated she would fax it once Dr Adriana Simas is notified

## 2019-12-12 LAB — SARS CORONAVIRUS 2 (TAT 6-24 HRS): SARS Coronavirus 2: NEGATIVE

## 2019-12-14 ENCOUNTER — Other Ambulatory Visit: Payer: Self-pay

## 2019-12-14 ENCOUNTER — Encounter
Admission: RE | Disposition: A | Discharge status: Home / Self Care | Payer: Self-pay | Source: Home / Self Care | Attending: Neurosurgery

## 2019-12-14 ENCOUNTER — Ambulatory Visit: Payer: 59 | Admitting: Anesthesiology

## 2019-12-14 ENCOUNTER — Encounter: Payer: Self-pay | Admitting: Neurosurgery

## 2019-12-14 ENCOUNTER — Ambulatory Visit
Admission: RE | Admit: 2019-12-14 | Discharge: 2019-12-14 | Disposition: A | Discharge status: Home / Self Care | Payer: 59 | Attending: Neurosurgery | Admitting: Neurosurgery

## 2019-12-14 DIAGNOSIS — M1990 Unspecified osteoarthritis, unspecified site: Secondary | ICD-10-CM | POA: Diagnosis not present

## 2019-12-14 DIAGNOSIS — G629 Polyneuropathy, unspecified: Secondary | ICD-10-CM | POA: Insufficient documentation

## 2019-12-14 DIAGNOSIS — Z8249 Family history of ischemic heart disease and other diseases of the circulatory system: Secondary | ICD-10-CM | POA: Diagnosis not present

## 2019-12-14 DIAGNOSIS — Z793 Long term (current) use of hormonal contraceptives: Secondary | ICD-10-CM | POA: Insufficient documentation

## 2019-12-14 DIAGNOSIS — K219 Gastro-esophageal reflux disease without esophagitis: Secondary | ICD-10-CM | POA: Diagnosis not present

## 2019-12-14 DIAGNOSIS — Z880 Allergy status to penicillin: Secondary | ICD-10-CM | POA: Insufficient documentation

## 2019-12-14 DIAGNOSIS — Z975 Presence of (intrauterine) contraceptive device: Secondary | ICD-10-CM | POA: Diagnosis not present

## 2019-12-14 DIAGNOSIS — Z79899 Other long term (current) drug therapy: Secondary | ICD-10-CM | POA: Insufficient documentation

## 2019-12-14 DIAGNOSIS — I10 Essential (primary) hypertension: Secondary | ICD-10-CM | POA: Diagnosis not present

## 2019-12-14 DIAGNOSIS — R531 Weakness: Secondary | ICD-10-CM | POA: Insufficient documentation

## 2019-12-14 DIAGNOSIS — G8918 Other acute postprocedural pain: Secondary | ICD-10-CM | POA: Insufficient documentation

## 2019-12-14 DIAGNOSIS — F419 Anxiety disorder, unspecified: Secondary | ICD-10-CM | POA: Diagnosis not present

## 2019-12-14 HISTORY — PX: PERONEAL NERVE DECOMPRESSION: SHX2226

## 2019-12-14 LAB — POCT I-STAT, CHEM 8
BUN: 4 mg/dL — ABNORMAL LOW (ref 6–20)
Calcium, Ion: 1.14 mmol/L — ABNORMAL LOW (ref 1.15–1.40)
Chloride: 103 mmol/L (ref 98–111)
Creatinine, Ser: 0.5 mg/dL (ref 0.44–1.00)
Glucose, Bld: 108 mg/dL — ABNORMAL HIGH (ref 70–99)
HCT: 41 % (ref 36.0–46.0)
Hemoglobin: 13.9 g/dL (ref 12.0–15.0)
Potassium: 3.2 mmol/L — ABNORMAL LOW (ref 3.5–5.1)
Sodium: 138 mmol/L (ref 135–145)
TCO2: 24 mmol/L (ref 22–32)

## 2019-12-14 LAB — ABO/RH: ABO/RH(D): A POS

## 2019-12-14 LAB — POCT PREGNANCY, URINE: Preg Test, Ur: NEGATIVE

## 2019-12-14 SURGERY — PERONEAL NERVE DECOMPRESSION
Anesthesia: General | Laterality: Left

## 2019-12-14 MED ORDER — BUPIVACAINE HCL (PF) 0.5 % IJ SOLN
INTRAMUSCULAR | Status: DC | PRN
  Administered 2019-12-14: 2 mL

## 2019-12-14 MED ORDER — PROPOFOL 10 MG/ML IV BOLUS
INTRAVENOUS | Status: AC
  Filled 2019-12-14: qty 20

## 2019-12-14 MED ORDER — ONDANSETRON HCL 4 MG/2ML IJ SOLN
INTRAMUSCULAR | Status: DC | PRN
  Administered 2019-12-14: 4 mg via INTRAVENOUS

## 2019-12-14 MED ORDER — LIDOCAINE HCL (CARDIAC) PF 100 MG/5ML IV SOSY
PREFILLED_SYRINGE | INTRAVENOUS | Status: DC | PRN
  Administered 2019-12-14: 80 mg via INTRAVENOUS

## 2019-12-14 MED ORDER — SUCCINYLCHOLINE CHLORIDE 20 MG/ML IJ SOLN
INTRAMUSCULAR | Status: DC | PRN
  Administered 2019-12-14: 100 mg via INTRAVENOUS

## 2019-12-14 MED ORDER — DEXAMETHASONE SODIUM PHOSPHATE 10 MG/ML IJ SOLN
INTRAMUSCULAR | Status: DC | PRN
  Administered 2019-12-14: 10 mg via INTRAVENOUS

## 2019-12-14 MED ORDER — MIDAZOLAM HCL 2 MG/2ML IJ SOLN
INTRAMUSCULAR | Status: AC
  Filled 2019-12-14: qty 2

## 2019-12-14 MED ORDER — FENTANYL CITRATE (PF) 100 MCG/2ML IJ SOLN
INTRAMUSCULAR | Status: DC | PRN
  Administered 2019-12-14 (×2): 50 ug via INTRAVENOUS

## 2019-12-14 MED ORDER — LABETALOL HCL 5 MG/ML IV SOLN: 5.0000 mg | Freq: Once | INTRAVENOUS | Status: AC

## 2019-12-14 MED ORDER — ONDANSETRON HCL 4 MG/2ML IJ SOLN: 4.0000 mg | Freq: Once | INTRAMUSCULAR | Status: DC | PRN

## 2019-12-14 MED ORDER — ONDANSETRON HCL 4 MG/2ML IJ SOLN
INTRAMUSCULAR | Status: AC
  Filled 2019-12-14: qty 2

## 2019-12-14 MED ORDER — SUGAMMADEX SODIUM 200 MG/2ML IV SOLN
INTRAVENOUS | Status: DC | PRN
  Administered 2019-12-14: 100 mg via INTRAVENOUS

## 2019-12-14 MED ORDER — METHOCARBAMOL 500 MG PO TABS: 500.0000 mg | ORAL_TABLET | Freq: Four times a day (QID) | ORAL | 0 refills | Status: DC | PRN

## 2019-12-14 MED ORDER — DEXAMETHASONE SODIUM PHOSPHATE 10 MG/ML IJ SOLN
INTRAMUSCULAR | Status: AC
  Filled 2019-12-14: qty 1

## 2019-12-14 MED ORDER — LACTATED RINGERS IV SOLN: INTRAVENOUS | Status: DC

## 2019-12-14 MED ORDER — LABETALOL HCL 5 MG/ML IV SOLN
INTRAVENOUS | Status: AC
  Administered 2019-12-14: 5 mg via INTRAVENOUS
  Filled 2019-12-14: qty 4

## 2019-12-14 MED ORDER — PROPOFOL 10 MG/ML IV BOLUS
INTRAVENOUS | Status: DC | PRN
  Administered 2019-12-14: 180 mg via INTRAVENOUS

## 2019-12-14 MED ORDER — MIDAZOLAM HCL 2 MG/2ML IJ SOLN
INTRAMUSCULAR | Status: DC | PRN
  Administered 2019-12-14: 2 mg via INTRAVENOUS

## 2019-12-14 MED ORDER — ROCURONIUM BROMIDE 10 MG/ML (PF) SYRINGE
PREFILLED_SYRINGE | INTRAVENOUS | Status: AC
  Filled 2019-12-14: qty 10

## 2019-12-14 MED ORDER — FENTANYL CITRATE (PF) 100 MCG/2ML IJ SOLN
INTRAMUSCULAR | Status: AC
  Filled 2019-12-14: qty 2

## 2019-12-14 MED ORDER — VANCOMYCIN HCL IN DEXTROSE 1-5 GM/200ML-% IV SOLN: 1000.0000 mg | Freq: Once | INTRAVENOUS | Status: AC

## 2019-12-14 MED ORDER — VANCOMYCIN HCL IN DEXTROSE 1-5 GM/200ML-% IV SOLN
INTRAVENOUS | Status: AC
  Administered 2019-12-14: 1000 mg via INTRAVENOUS
  Filled 2019-12-14: qty 200

## 2019-12-14 MED ORDER — LIDOCAINE HCL (PF) 2 % IJ SOLN
INTRAMUSCULAR | Status: AC
  Filled 2019-12-14: qty 10

## 2019-12-14 MED ORDER — SUCCINYLCHOLINE CHLORIDE 200 MG/10ML IV SOSY
PREFILLED_SYRINGE | INTRAVENOUS | Status: AC
  Filled 2019-12-14: qty 10

## 2019-12-14 MED ORDER — ROCURONIUM BROMIDE 100 MG/10ML IV SOLN
INTRAVENOUS | Status: DC | PRN
  Administered 2019-12-14: 5 mg via INTRAVENOUS

## 2019-12-14 MED ORDER — FENTANYL CITRATE (PF) 100 MCG/2ML IJ SOLN: 25.0000 ug | INTRAMUSCULAR | Status: DC | PRN

## 2019-12-14 MED ORDER — OXYCODONE HCL 5 MG PO TABS: 5.0000 mg | ORAL_TABLET | ORAL | 0 refills | Status: DC | PRN

## 2019-12-14 SURGICAL SUPPLY — 42 items
ADH SKN CLS APL DERMABOND .7 (GAUZE/BANDAGES/DRESSINGS) ×1
APL PRP STRL LF DISP 70% ISPRP (MISCELLANEOUS) ×2
BNDG GAUZE 4.5X4.1 6PLY STRL (MISCELLANEOUS) ×1 IMPLANT
CANISTER SUCT 1200ML W/VALVE (MISCELLANEOUS) ×3 IMPLANT
CHLORAPREP W/TINT 26 (MISCELLANEOUS) ×6 IMPLANT
CORD BIP STRL DISP 12FT (MISCELLANEOUS) ×3 IMPLANT
COVER WAND RF STERILE (DRAPES) ×3 IMPLANT
DERMABOND ADVANCED (GAUZE/BANDAGES/DRESSINGS) ×2
DERMABOND ADVANCED .7 DNX12 (GAUZE/BANDAGES/DRESSINGS) IMPLANT
DRAPE INCISE IOBAN 66X45 STRL (DRAPES) ×2 IMPLANT
DRAPE THYROID T SHEET (DRAPES) ×5 IMPLANT
DRSG TEGADERM 4X4.75 (GAUZE/BANDAGES/DRESSINGS) ×2 IMPLANT
DRSG TELFA 3X8 NADH (GAUZE/BANDAGES/DRESSINGS) ×3 IMPLANT
ELECT CAUTERY BLADE TIP 2.5 (TIP)
ELECTRODE CAUTERY BLDE TIP 2.5 (TIP) ×1 IMPLANT
FORCEPS JEWEL BIP 4-3/4 STR (INSTRUMENTS) ×3 IMPLANT
GAUZE SPONGE 4X4 12PLY STRL (GAUZE/BANDAGES/DRESSINGS) ×6 IMPLANT
GAUZE XEROFORM 1X8 LF (GAUZE/BANDAGES/DRESSINGS) ×3 IMPLANT
GLOVE BIOGEL PI IND STRL 7.0 (GLOVE) ×1 IMPLANT
GLOVE BIOGEL PI INDICATOR 7.0 (GLOVE) ×2
GLOVE INDICATOR 8.0 STRL GRN (GLOVE) ×3 IMPLANT
GLOVE SURG SYN 7.0 (GLOVE) ×6 IMPLANT
GLOVE SURG SYN 7.0 PF PI (GLOVE) ×2 IMPLANT
GLOVE SURG SYN 8.0 (GLOVE) ×3 IMPLANT
GLOVE SURG SYN 8.0 PF PI (GLOVE) ×1 IMPLANT
GOWN STRL REUS W/ TWL LRG LVL3 (GOWN DISPOSABLE) ×2 IMPLANT
GOWN STRL REUS W/ TWL XL LVL3 (GOWN DISPOSABLE) ×1 IMPLANT
GOWN STRL REUS W/TWL LRG LVL3 (GOWN DISPOSABLE) ×6
GOWN STRL REUS W/TWL XL LVL3 (GOWN DISPOSABLE) ×3
KIT TURNOVER KIT A (KITS) ×3 IMPLANT
NS IRRIG 1000ML POUR BTL (IV SOLUTION) ×1 IMPLANT
NS IRRIG 500ML POUR BTL (IV SOLUTION) ×2 IMPLANT
PACK EXTREMITY ARMC (MISCELLANEOUS) ×3 IMPLANT
PAD DRESSING TELFA 3X8 NADH (GAUZE/BANDAGES/DRESSINGS) IMPLANT
STOCKINETTE 48X4 2 PLY STRL (GAUZE/BANDAGES/DRESSINGS) ×1 IMPLANT
STOCKINETTE STRL 4IN 9604848 (GAUZE/BANDAGES/DRESSINGS) IMPLANT
SUT ETHILON 3-0 FS-10 30 BLK (SUTURE)
SUT MNCRL AB 3-0 PS2 27 (SUTURE) ×2 IMPLANT
SUT POLYSORB 3-0 18 V-20 (SUTURE) ×2 IMPLANT
SUT VIC AB 2-0 SH 27 (SUTURE) ×6
SUT VIC AB 2-0 SH 27XBRD (SUTURE) IMPLANT
SUTURE EHLN 3-0 FS-10 30 BLK (SUTURE) ×1 IMPLANT

## 2019-12-14 NOTE — Discharge Instructions (Addendum)
NEUROSURGERY DISCHARGE INSTRUCTIONS  The following are instructions to help in your recovery once you have been discharged from the hospital. Even if you feel well, it is important that you follow these activity guidelines.  What to do after you leave the hospital:  Recommended diet:  Increase protein intake to promote wound healing. You may return to your usual diet. However, you may experience discomfort when swallowing in the first month after your surgery. This is normal. You may find that softer foods are more comfortable for you to swallow. Be sure to stay hydrated.   Recommended activity: No lifting or squatting. Avoid lifting objects heavier than 10 pounds (gallon milk jug). Where possible, avoid household activities that involve lifting, bending, reaching, pushing, or pulling such as laundry, vacuuming, grocery shopping, and childcare. Try to arrange for help from friends and family for these activities while you heal.   Increase physical activity slowly as tolerated. Taking short walks is encouraged, but avoid strenuous exercise. Do not jog, run, bicycle, lift weights, or participate in any other exercises unless specifically allowed by your doctor.   You should not drive until cleared by your doctor.   Until released by your doctor, you should not return to work or school. You should rest at home and let your body heal.   You may shower the day after your surgery. After showering, lightly dab your incision dry. Do not take a tub bath or go swimming until approved by your doctor at your follow-up appointment.   If you smoke, we strongly recommend that you quit. Smoking has been proven to interfere with normal bone healing and will dramatically reduce the success rate of your surgery. Please contact [facility] (800-QUIT-NOW) and use the resources at www.[facility].com for assistance in stopping smoking.   Medications  Do not restart Aspirin until seven days after surgery  * Do not  take anti-inflammatory medications for 3 days after surgery (naproxen [Aleve], ibuprofen [Advil, Motrin], celecoxib [Celebrex], etc.).   You may restart home medications.   Wound Care Instructions  If you have a dressing on your incision, remove it two days after your surgery. Keep your incision area clean and dry.   If you have staples or stitches on your incision, you should have a follow up scheduled for removal. If you do not have staples or stitches, you will have steri-strips (small pieces of surgical tape) or Dermabond glue. The steri-strips/glue should begin to peel away within about a week (it is fine if the steri-strips fall off before then). If the strips are still in place one week after your surgery, you may gently remove them.    Please Report any of the following: Should you experience any of the following, contact us immediately:   New numbness or weakness   Pain that is progressively getting worse, and is not relieved by your pain medication, muscle relaxers, rest, and warm compresses   Bleeding, redness, swelling, pain, or drainage from surgical incision   Chills or flu-like symptoms   Fever greater than 101.0 F (38.3 C)   Inability to eat, drink fluids, or take medications   Problems with bowel or bladder functions   Difficulty breathing or shortness of breath   Warmth, tenderness, or swelling in your calf    Additional Follow up appointments During office hours (Monday-Friday 9 am to 5 pm), please call your physician at [phone] and ask for Berdine Addison.   After hours and weekends, please call [phone] and an answering service will  put you in touch with either Dr. Adriana Simas or Dr. Myer Haff.   For a life-threatening emergency, call 911      AMBULATORY SURGERY  DISCHARGE INSTRUCTIONS   1) The drugs that you were given will stay in your system until tomorrow so for the next 24 hours you should not:  A) Drive an automobile B) Make any legal  decisions C) Drink any alcoholic beverage   2) You may resume regular meals tomorrow.  Today it is better to start with liquids and gradually work up to solid foods.  You may eat anything you prefer, but it is better to start with liquids, then soup and crackers, and gradually work up to solid foods.   3) Please notify your doctor immediately if you have any unusual bleeding, trouble breathing, redness and pain at the surgery site, drainage, fever, or pain not relieved by medication.    4) Additional Instructions:        Please contact your physician with any problems or Same Day Surgery at [phone], Monday through Friday 6 am to 4 pm, or [place] at [facility] number at [phone].

## 2019-12-14 NOTE — Anesthesia Preprocedure Evaluation (Signed)
Anesthesia Evaluation  Patient identified by MRN, date of birth, ID band Patient awake    Reviewed: Allergy & Precautions, NPO status , Patient's Chart, lab work & pertinent test results  Airway Mallampati: II  TM Distance: >3 FB     Dental   Pulmonary neg pulmonary ROS,    Pulmonary exam normal        Cardiovascular hypertension, Normal cardiovascular exam     Neuro/Psych  Headaches, PSYCHIATRIC DISORDERS Anxiety Depression  Neuromuscular disease    GI/Hepatic Neg liver ROS, GERD  Medicated,  Endo/Other  negative endocrine ROS  Renal/GU negative Renal ROS  negative genitourinary   Musculoskeletal  (+) Arthritis ,   Abdominal Normal abdominal exam  (+)   Peds negative pediatric ROS (+)  Hematology negative hematology ROS (+)   Anesthesia Other Findings   Reproductive/Obstetrics                             Anesthesia Physical Anesthesia Plan  ASA: II  Anesthesia Plan: General   Post-op Pain Management:    Induction: Intravenous  PONV Risk Score and Plan:   Airway Management Planned:   Additional Equipment:   Intra-op Plan:   Post-operative Plan: Extubation in OR  Informed Consent: I have reviewed the patients History and Physical, chart, labs and discussed the procedure including the risks, benefits and alternatives for the proposed anesthesia with the patient or authorized representative who has indicated his/her understanding and acceptance.     Dental advisory given  Plan Discussed with: CRNA and Surgeon  Anesthesia Plan Comments:         Anesthesia Quick Evaluation

## 2019-12-14 NOTE — Anesthesia Postprocedure Evaluation (Signed)
Anesthesia Post Note  Patient: Phyllis Myers  Procedure(s) Performed: PERONEAL NERVE DECOMPRESSION (Left )  Patient location during evaluation: PACU Anesthesia Type: General Level of consciousness: awake and alert and oriented Pain management: pain level controlled Vital Signs Assessment: post-procedure vital signs reviewed and stable Respiratory status: spontaneous breathing Cardiovascular status: blood pressure returned to baseline Anesthetic complications: no     Last Vitals:  Vitals:   12/14/19 0910 12/14/19 0931  BP: (!) 125/95 140/88  Pulse: 82 71  Resp: 13 18  Temp: 36.9 C 36.8 C  SpO2: 100% 100%    Last Pain:  Vitals:   12/14/19 0931  TempSrc: Temporal  PainSc: 0-No pain                 Kiyon Fidalgo

## 2019-12-14 NOTE — Interval H&P Note (Signed)
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  The various methods of treatment have been discussed with the patient and family. After consideration of risks, benefits and other options for treatment, the patient has consented to  Procedure(s): PERONEAL NERVE DECOMPRESSION (Left) as a surgical intervention.  The patient's history has been reviewed, patient examined, no change in status, stable for surgery.  I have reviewed the patient's chart and labs.  Questions were answered to the patient's satisfaction.     [name]

## 2019-12-14 NOTE — Discharge Summary (Signed)
  Procedure: Left peroneal decompression  Procedure date: 12/14/2019 Diagnosis: Left foot weakness   History: Phyllis Myers is s/p left peroneal decompression for left foot weakness  POD: Tolerated procedure well. Evaluated in post op recovery still disoriented from anesthesia but able to answer questions and obey commands. Denies any new pain/numbness/tingling in left lower extemity  Physical Exam: Vitals:   12/14/19 0611  BP: (!) 136/99  Pulse: 91  Resp: 18  Temp: (!) 97.4 F (36.3 C)  SpO2: 98%    Strength:5/5 throughout left lower extremity bilaterally, except 4+/5 EHL Sensation: intact and symmetric throughout left lower extremity Skin: dressing intact   Data:  Recent Labs  Lab 12/11/19 0943 12/11/19 0943 12/14/19 0635  NA 136   < > 138  K 3.3*   < > 3.2*  CL 99   < > 103  CO2 26  --   --   BUN <5*   < > 4*  CREATININE 0.54   < > 0.50  GLUCOSE 110*   < > 108*  CALCIUM 8.7*  --   --    < > = values in this interval not displayed.   No results for input(s): AST, ALT, ALKPHOS in the last 168 hours.  Invalid input(s): TBILI   Recent Labs  Lab 12/11/19 0943 12/11/19 0943 12/14/19 0635  WBC 4.1  --   --   HGB 14.2   < > 13.9  HCT 39.5   < > 41.0  PLT 136*  --   --    < > = values in this interval not displayed.   Recent Labs  Lab 12/11/19 0943  APTT 26  INR 1.0         Other tests/results: No imaging reviewed  Assessment/Plan:  Phyllis Myers is POD0 s/p left peroneal nerve decompression  Will continue post op pain control with pain medication, muscle relaxer, and tylenol as needed. She is scheduled to follow up in clinic in approximately 2 weeks to monitor progress.    Ivar Drape PA-C Department of Neurosurgery

## 2019-12-14 NOTE — Transfer of Care (Signed)
Immediate Anesthesia Transfer of Care Note  Patient: Phyllis Myers  Procedure(s) Performed: PERONEAL NERVE DECOMPRESSION (Left )  Patient Location: PACU  Anesthesia Type:General  Level of Consciousness: drowsy and patient cooperative  Airway & Oxygen Therapy: Patient Spontanous Breathing and Patient connected to face mask oxygen  Post-op Assessment: Report given to RN and Post -op Vital signs reviewed and stable  Post vital signs: Reviewed and stable  Last Vitals:  Vitals Value Taken Time  BP 138/98 12/14/19 0841  Temp 36.8 C 12/14/19 0840  Pulse 120 12/14/19 0842  Resp 18 12/14/19 0842  SpO2 100 % 12/14/19 0842  Vitals shown include unvalidated device data.  Last Pain:  Vitals:   12/14/19 0840  TempSrc:   PainSc: 0-No pain         Complications: No apparent anesthesia complications

## 2019-12-14 NOTE — Op Note (Signed)
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 EMG/NCS did reveal concern for chronic S1 radiculopathy but also the peroneal nerve dysfunction in the legs.  I discussed these results with the patient and recommended a peroneal nerve exploration on the left.  The risks of hematoma, infection, weakness, numbness, stroke, and death were discussed in detail. All questions were answered and the patient elected to proceed with the surgery.  Procedure After obtaining informed consent, the patient was transferred to the operating room. The patient was given preoperative prophylactic IV antibiotics. For anesthesia,general anesthesiawas delivered by the anesthesia service. Thepatient was repositioned supine with theleftleg flexed and internally rotated to expose the lateral knee and leg. The leg was prepped and draped sterilely. A hard time out was performed. A transverse incision was planned just inferior to the head of the fibula along course of nerve that could be palpated. Local anesthetic was instilled.   The incision was opened sharply to the level of the fascia. The tissue was dissected bluntly until nerve identified and it appeared significant infiltrated by fatty tissue. The nerve did appear of normal caliber.It was followed proximally releasing the overlying tissue and it was probed proximallyand seen to befree heading into the popliteal fossa.  There, the nerve did  appear more smooth.  The fatty portion of the nerve was followed distally until entering beneath the fascia of the peroneus longus muscle. Here the fascia was incised superficially and the muscle retracted to visualize the deep fascia overlying the nerve. This was transected and the nerve distally was consistent with fatty infiltration.  At this point, the nerve was completely relieved from compression and no further exploration needed.   The wound was irrigated with antibiotic laden saline. The wound was closed with2-0 vicryls and 3-0 monocryl with Dermabond on the skin. The patient was awakened from anesthesia and taken to PACU for recovery.All sponge counts, needle counts, and instrument counts were correct at the end of the case x 2. All pressure points remained padded throughout the entire case. The patient tolerated the procedure well without any complications and was transferred in stable condition to the PACU. I spoke with the family following the case    ESTIMATED BLOOD LOSS:10cc   SPECIMENS:None   ATTESTATION: I performed the procedure in its entirety with the assistance of Ivar Drape, physician assistant  Phyllis Myers [phone]

## 2019-12-14 NOTE — H&P (Signed)
Phyllis Myers is an 39 y.o. female.   Chief Complaint: Left foot weakness HPI: Phyllis Myers returns for evaluation of her left foot weakness. She has undergone a repeat nerve conduction study in the interim and seeing Dr. Cari Caraway with discussion of findings of a lumbar radiculopathy and peroneal nerve dysfunction. She returns today to discuss possible peroneal nerve decompression. She states that she continues to have the numbness going towards her big toe on the left she definitely has some of the weakness in the big toe. She does not endorse any obvious pain going down the leg. 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   Past Medical History:  Diagnosis Date  . Arthritis   . Depression, major, single episode, mild ([place]) 12/20/2016  . Hypertension    not on medications at this time    History reviewed. No pertinent surgical history.  Family History  Problem Relation Age of Onset  . Pulmonary Hypertension Mother   . Cardiomyopathy Mother   . Heart disease Mother   . Heart disease Father 36       MI  . Heart disease Maternal Grandfather   . Heart disease Paternal Grandfather    Social History:  reports that she has never smoked. She has never used smokeless tobacco. She reports current alcohol use of about 4.0 standard drinks of alcohol per week. She reports that she does not use drugs.  Allergies:  Allergies  Allergen Reactions  . Amoxicillin     Muscle pain  . Sulfasalazine     Other reaction(s): Headache  . Naproxen Sodium Rash    Medications Prior to Admission  Medication Sig Dispense Refill  . Adalimumab (HUMIRA) 40 MG/0.4ML PSKT Inject 40 mg into the skin every 14 (fourteen) days.    . cholecalciferol (VITAMIN D3) 25 MCG (1000 UNIT) tablet Take 1,000 Units by mouth daily.    . cyanocobalamin (,VITAMIN B-12,) 1000 MCG/ML injection Inject 1,000 mcg into the muscle every  30 (thirty) days.    . diazepam (VALIUM) 5 MG tablet Take 1 tablet (5 mg total) by mouth every 8 (eight) hours as needed for anxiety. 30 tablet 1  . etodolac (LODINE) 200 MG capsule Take 200 mg by mouth 2 (two) times daily.     Marland Kitchen levonorgestrel (MIRENA) 20 MCG/24HR IUD 1 each by Intrauterine route once.    Marland Kitchen omeprazole (PRILOSEC) 40 MG capsule TAKE 1 CAPSULE(40 MG) BY MOUTH DAILY (Patient taking differently: Take 40 mg by mouth daily. ) 90 capsule 0  . albuterol (PROVENTIL HFA;VENTOLIN HFA) 108 (90 Base) MCG/ACT inhaler INHALE 2 PUFFS INTO THE LUNGS EVERY 6 HOURS AS NEEDED FOR WHEEZING OR SHORTNESS OF BREATH (Patient not taking: Reported on 02/16/2019) 8.5 g 2    Results for orders placed or performed during the hospital encounter of 12/14/19 (from the past 48 hour(s))  Pregnancy, urine POC     Status: None   Collection Time: 12/14/19  6:20 AM  Result Value Ref Range   Preg Test, Ur NEGATIVE NEGATIVE    Comment:        THE SENSITIVITY OF THIS METHODOLOGY IS >24 mIU/mL   I-STAT, chem 8     Status: Abnormal   Collection Time: 12/14/19  6:35 AM  Result Value Ref Range   Sodium 138 135 - 145 mmol/L   Potassium 3.2 (L) 3.5 - 5.1 mmol/L   Chloride 103 98 - 111 mmol/L  BUN 4 (L) 6 - 20 mg/dL   Creatinine, Ser 5.32 0.44 - 1.00 mg/dL   Glucose, Bld 992 (H) 70 - 99 mg/dL    Comment: Glucose reference range applies only to samples taken after fasting for at least 8 hours.   Calcium, Ion 1.14 (L) 1.15 - 1.40 mmol/L   TCO2 24 22 - 32 mmol/L   Hemoglobin 13.9 12.0 - 15.0 g/dL   HCT 42.6 83.4 - 19.6 %   No results found.  Review of Systems General ROS: Negative Respiratory ROS: Negative Cardiovascular ROS: Negative Gastrointestinal ROS: Negative Genito-Urinary ROS: Negative Musculoskeletal ROS: Negative Neurological ROS: Positive for left foot weakness, numbness Dermatological ROS: Negative  Blood pressure (!) 136/99, pulse 91, temperature (!) 97.4 F (36.3 C), temperature source  Temporal, resp. rate 18, height 5\' 5"  (1.651 m), weight 83.5 kg, last menstrual period 12/09/2019, SpO2 98 %. Physical Exam  General appearance: Alert, cooperative, in no acute distress CV: Regular rate and rhythm Pulm: Clear to auscultation   Neurologic exam:  Mental status: alertness: alert, affect: normal Speech: fluent and clear Motor:strength symmetric 5/5 in left foot inversion, plantarflexion. She is 4 out of 5 in EHL, 4+ out of 5 in dorsiflexion and eversion Sensory: intact to light touch with exception of the great toe on the left Gait: normal   EMG/NCS  Impression: Complicated study (compared to study in Nov 2020), 1) Chronic left S1 radiculopathy. 2) Bilateral fibular (peroneal) motor nerve abnormalities.    Assessment/Plan We will proceed with lefts ide peroneal nerve decompression  Dec 2020, MD 12/14/2019, 6:41 AM

## 2019-12-14 NOTE — Anesthesia Procedure Notes (Signed)
Procedure Name: Intubation Performed by: Fredderick Phenix, CRNA Pre-anesthesia Checklist: Patient identified, Emergency Drugs available, Suction available and Patient being monitored Patient Re-evaluated:Patient Re-evaluated prior to induction Oxygen Delivery Method: Circle system utilized Preoxygenation: Pre-oxygenation with 100% oxygen Induction Type: IV induction Ventilation: Mask ventilation without difficulty Laryngoscope Size: 3 and Mac Grade View: Grade I Tube type: Oral Tube size: 7.0 mm Number of attempts: 1 Airway Equipment and Method: Stylet and Oral airway Placement Confirmation: ETT inserted through vocal cords under direct vision,  positive ETCO2 and breath sounds checked- equal and bilateral Secured at: 21 cm Tube secured with: Tape Dental Injury: Teeth and Oropharynx as per pre-operative assessment

## 2019-12-30 ENCOUNTER — Encounter: Payer: 59 | Admitting: Family Medicine

## 2020-01-03 NOTE — Progress Notes (Signed)
Phyllis Myers T. Dedee Liss, MD Primary Care and [place] at [facility], [address] Phone: [phone]  FAX: [place] - 39 y.o. female  MRN [id number]  Date of Birth: 04-13-1981  Visit Date: 01/04/2020  PCP: Owens Loffler, MD  Referred by: Owens Loffler, MD  Chief Complaint  Patient presents with  . Annual Exam    This visit occurred during the SARS-CoV-2 public health emergency.  Safety protocols were in place, including screening questions prior to the visit, additional usage of staff PPE, and extensive cleaning of exam room while observing appropriate contact time as indicated for disinfecting solutions.   Patient Care Team: Owens Loffler, MD as PCP - General (Family Medicine) Subjective:   Phyllis Myers is a 39 y.o. pleasant patient who presents with the following:  Health Maintenance Summary Reviewed and updated, unless pt declines services.  Tobacco History Reviewed. Non-smoker Alcohol: No concerns, no excessive use Exercise Habits: Some activity, rec at least 30 mins 5 times a week STD concerns: none Drug Use: None Lumps or breast concerns: no  Health Maintenance  Topic Date Due  . HIV Screening  Never done  . TETANUS/TDAP  01/03/2021 (Originally 07/14/1998)  . PAP SMEAR-Modifier  04/24/2020  . INFLUENZA VACCINE  05/01/2020   She is a pleasant lady who I recall well.  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  BP is ok now.  Recheck in the office. 118/80  Cancer center.  Dr. Duard Brady on Humira, started Enbrel first  Bad panic attacks right now, then might be ok for several weeks.   Lipid panel - Dr. Radene Knee, labs ok.   All of her labs done recently on Care Everywhere are reviewed.  She recently had a peroneal nerve release.  She is already feeling somewhat better from this but she has not fully recovered.  She has  to get her stitches out very soon.  There is no immunization history on file for this patient. Patient Active Problem List   Diagnosis Date Noted  . 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 Panic disorder 07/21/2014    Priority: Medium  . Generalized anxiety disorder 09/08/2013    Priority: Medium  . 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 Depression, major, single episode, mild ([place]) 12/20/2016  . GERD (gastroesophageal reflux disease) 12/02/2015  . Allergic rhinitis 07/21/2014  . Family history of early CAD 06/09/2013  . Common migraine 01/27/2013  . HTN (hypertension) 01/27/2013  . Hx of abnormal Pap smear 01/27/2013    Past Medical History:  Diagnosis Date  . Arthritis   . Depression, major, single episode, mild ([place]) 12/20/2016  . Generalized anxiety disorder 09/08/2013  . Hypertension    not on medications at this time  . Panic disorder 07/21/2014  . 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 Jefm Bryant). Now off enbrel, methotrexate, uses indomethacin and hydrocodone rarely for pain.     Past Surgical History:  Procedure Laterality Date  . PERONEAL NERVE DECOMPRESSION Left 12/14/2019   Procedure: PERONEAL NERVE DECOMPRESSION;  Surgeon: Deetta Perla, MD;  Location: [facility] ORS;  Service: Neurosurgery;  Laterality: Left;  . WISDOM TOOTH EXTRACTION      Family History  Problem Relation Age of Onset  . Pulmonary Hypertension Mother   . Cardiomyopathy Mother   . Heart disease Mother   . Heart disease  Father 65       MI  . Heart disease Maternal Grandfather   . Heart disease Paternal Grandfather     Past Medical History, Surgical History, Social History, Family History, Problem List, Medications, and Allergies have been reviewed and updated if relevant.  Review of Systems: Pertinent positives are listed above.  Otherwise, a full 14 point review of systems has been done in full and it is negative except where it is noted positive.   Objective:   BP 112/80   Pulse 76   Temp 98.6 F (37 C) (Temporal)   Ht 5' 4.5" (1.638 m)   Wt 190 lb 8 oz (86.4 kg)   LMP 12/09/2019 (Exact Date)   SpO2 99%   BMI 32.19 kg/m  Ideal Body Weight: Weight in (lb) to have BMI = 25: 147.6 No exam data present Depression screen [facility] 2/9 02/17/2018  Decreased Interest 0  Down, Depressed, Hopeless 0  PHQ - 2 Score 0     GEN: well developed, well nourished, no acute distress Eyes: conjunctiva and lids normal, PERRLA, EOMI ENT: TM clear, nares clear, oral exam WNL Neck: supple, no lymphadenopathy, no thyromegaly, no JVD Pulm: clear to auscultation and percussion, respiratory effort normal CV: regular rate and rhythm, S1-S2, no murmur, rub or gallop, no bruits Chest: no scars, masses, no lumps BREAST: breast exam declined GI: soft, non-tender; no hepatosplenomegaly, masses; active bowel sounds all quadrants GU: GU exam declined Lymph: no cervical, axillary or inguinal adenopathy MSK: gait normal, muscle tone and strength WNL, no joint swelling, effusions, discoloration, crepitus  SKIN: clear, good turgor, color WNL, no rashes, lesions, or ulcerations Neuro: normal mental status, normal strength, sensation, and motion Psych: alert; oriented to person, place and time, normally interactive and not anxious or depressed in appearance.   All labs reviewed with patient. Results for orders placed or performed during the hospital encounter of 12/14/19  Pregnancy, urine POC  Result Value Ref Range   Preg Test, Ur NEGATIVE NEGATIVE  I-STAT, chem 8  Result Value Ref Range   Sodium 138 135 - 145 mmol/L   Potassium 3.2 (L) 3.5 - 5.1 mmol/L   Chloride 103 98 - 111 mmol/L   BUN 4 (L) 6 - 20 mg/dL   Creatinine, Ser 0.50 0.44 - 1.00 mg/dL   Glucose, Bld 108 (H) 70 - 99 mg/dL   Calcium, Ion 1.14 (L) 1.15 - 1.40 mmol/L   TCO2 24 22 - 32 mmol/L   Hemoglobin 13.9 12.0 - 15.0 g/dL   HCT 41.0 36.0 - 46.0 %  ABO/Rh  Result Value Ref Range    ABO/RH(D)      A POS Performed at [facility], [address] [place], [place] [address]    No results found.   Lab Review:  CBC EXTENDED Latest Ref Rng & Units 12/14/2019 12/11/2019 05/15/2019  WBC 4.0 - 10.5 K/uL - 4.1 6.2  RBC 3.87 - 5.11 MIL/uL - 3.89 4.31  HGB 12.0 - 15.0 g/dL 13.9 14.2 15.3(H)  HCT 36.0 - 46.0 % 41.0 39.5 42.4  PLT 150 - 400 K/uL - 136(L) 192  NEUTROABS 1.7 - 7.7 K/uL - - 4.4  LYMPHSABS 0.7 - 4.0 K/uL - - 1.1    BMP Latest Ref Rng & Units 12/14/2019 12/11/2019 02/13/2018  Glucose 70 - 99 mg/dL 108(H) 110(H) 87  BUN 6 - 20 mg/dL 4(L) <5(L) 11  Creatinine 0.44 - 1.00 mg/dL 0.50 0.54 0.62  Sodium 135 - 145 mmol/L 138 136  141  Potassium 3.5 - 5.1 mmol/L 3.2(L) 3.3(L) 3.9  Chloride 98 - 111 mmol/L 103 99 105  CO2 22 - 32 mmol/L - 26 24  Calcium 8.9 - 10.3 mg/dL - 8.7(L) 9.1    Hepatic Function Latest Ref Rng & Units 02/13/2018 02/08/2016 06/08/2013  Total Protein 6.0 - 8.3 g/dL 7.0 7.5 7.1  Albumin 3.5 - 5.2 g/dL 4.2 4.9 4.0  AST 0 - 37 U/L 26 32 19  ALT 0 - 35 U/L 24 33 33  Alk Phosphatase 39 - 117 U/L 48 46 54  Total Bilirubin 0.2 - 1.2 mg/dL 0.8 1.0 0.4  Bilirubin, Direct 0.0 - 0.3 mg/dL 0.2 0.2 -    Lab Results  Component Value Date   CHOL 173 02/13/2018   Lab Results  Component Value Date   HDL 55.20 02/13/2018   Lab Results  Component Value Date   LDLCALC 80 02/13/2018   Lab Results  Component Value Date   TRIG 191.0 (H) 02/13/2018   Lab Results  Component Value Date   CHOLHDL 3 02/13/2018   No results for input(s): PSA in the last 72 hours. No results found for: HCVAB Lab Results  Component Value Date   VD25OH 43.32 02/08/2016     No results found for: HGBA1C Lab Results  Component Value Date   LDLCALC 80 02/13/2018   CREATININE 0.50 12/14/2019    Assessment and Plan:     ICD-10-CM   1. Healthcare maintenance  Z00.00    Her panic attacks have gotten somewhat worse and more frequent, son can start her on some  baseline Zoloft.  Value as needed.  Recommended COVID-19 vaccine given that she is on Humira.  Health Maintenance Exam: The patient's preventative maintenance and recommended screening tests for an annual wellness exam were reviewed in full today. Brought up to date unless services declined.  Counselled on the importance of diet, exercise, and its role in overall health and mortality. The patient's FH and SH was reviewed, including their home life, tobacco status, and drug and alcohol status.  Follow-up in 1 year for physical exam or additional follow-up below.  Follow-up: Return in about 2 months (around 03/05/2020) for f/u virtual visit, recheck panic attacks. Or follow-up in 1 year if not noted.  Future Appointments  Date Time Provider [place]  03/03/2020  2:00 PM Travaughn Vue, Frederico Hamman, MD [facility]    Meds ordered this encounter  Medications  . sertraline (ZOLOFT) 25 MG tablet    Sig: Take 1 tablet (25 mg total) by mouth daily.    Dispense:  30 tablet    Refill:  2  . diazepam (VALIUM) 5 MG tablet    Sig: Take 1 tablet (5 mg total) by mouth every 8 (eight) hours as needed for anxiety.    Dispense:  30 tablet    Refill:  2   Medications Discontinued During This Encounter  Medication Reason  . albuterol (PROVENTIL HFA;VENTOLIN HFA) 108 (90 Base) MCG/ACT inhaler Completed Course  . diazepam (VALIUM) 5 MG tablet Completed Course  . methocarbamol (ROBAXIN) 500 MG tablet Completed Course  . oxyCODONE (ROXICODONE) 5 MG immediate release tablet Completed Course  . diazepam (VALIUM) 5 MG tablet Reorder   No orders of the defined types were placed in this encounter.   Signed,  Maud Deed. Makinlee Awwad, MD   Allergies as of 01/04/2020      Reactions   Amoxicillin    Muscle pain   Sulfasalazine    Other reaction(s):  Headache   Naproxen Sodium Rash      Medication List       Accurate as of January 04, 2020  6:07 PM. If you have any questions, ask your nurse or doctor.         STOP taking these medications   albuterol 108 (90 Base) MCG/ACT inhaler Commonly known as: VENTOLIN HFA Stopped by: Owens Loffler, MD   methocarbamol 500 MG tablet Commonly known as: Robaxin Stopped by: Owens Loffler, MD   oxyCODONE 5 MG immediate release tablet Commonly known as: Roxicodone Stopped by: Owens Loffler, MD     TAKE these medications   cholecalciferol 25 MCG (1000 UNIT) tablet Commonly known as: VITAMIN D3 Take 1,000 Units by mouth daily.   cyanocobalamin 1000 MCG/ML injection Commonly known as: (VITAMIN B-12) Inject 1,000 mcg into the muscle every 30 (thirty) days.   diazepam 5 MG tablet Commonly known as: VALIUM Take 1 tablet (5 mg total) by mouth every 8 (eight) hours as needed for anxiety. What changed: Another medication with the same name was removed. Continue taking this medication, and follow the directions you see here. Changed by: Owens Loffler, MD   etodolac 400 MG tablet Commonly known as: LODINE Take 400 mg by mouth 2 (two) times daily.   Humira Pen 40 MG/0.4ML Pnkt Generic drug: Adalimumab SMARTSIG:40 Milligram(s) SUB-Q Every 2 Weeks   levonorgestrel 20 MCG/24HR IUD Commonly known as: MIRENA 1 each by Intrauterine route once.   omeprazole 40 MG capsule Commonly known as: PRILOSEC TAKE 1 CAPSULE(40 MG) BY MOUTH DAILY   sertraline 25 MG tablet Commonly known as: ZOLOFT Take 1 tablet (25 mg total) by mouth daily. Started by: Owens Loffler, MD

## 2020-01-04 ENCOUNTER — Other Ambulatory Visit: Payer: Self-pay

## 2020-01-04 ENCOUNTER — Ambulatory Visit (INDEPENDENT_AMBULATORY_CARE_PROVIDER_SITE_OTHER): Payer: 59 | Admitting: Family Medicine

## 2020-01-04 ENCOUNTER — Encounter: Payer: Self-pay | Admitting: Family Medicine

## 2020-01-04 VITALS — BP 112/80 | HR 76 | Temp 98.6°F | Ht 64.5 in | Wt 190.5 lb

## 2020-01-04 DIAGNOSIS — Z0000 Encounter for general adult medical examination without abnormal findings: Secondary | ICD-10-CM

## 2020-01-04 MED ORDER — SERTRALINE HCL 25 MG PO TABS: 25.0000 mg | ORAL_TABLET | Freq: Every day | ORAL | 2 refills | Status: DC

## 2020-01-04 MED ORDER — DIAZEPAM 5 MG PO TABS: 5.0000 mg | ORAL_TABLET | Freq: Three times a day (TID) | ORAL | 2 refills | Status: DC | PRN

## 2020-01-07 ENCOUNTER — Other Ambulatory Visit: Payer: Self-pay

## 2020-01-07 ENCOUNTER — Ambulatory Visit: Payer: 59 | Attending: Internal Medicine

## 2020-01-07 DIAGNOSIS — Z23 Encounter for immunization: Secondary | ICD-10-CM

## 2020-01-07 NOTE — Progress Notes (Signed)
   Covid-19 Vaccination Clinic  Name:  Phyllis Myers    MRN: [id number] DOB: June 07, 1981  01/07/2020  Ms. Phyllis Myers was observed post Covid-19 immunization for 15 minutes without incident. She was provided with Vaccine Information Sheet and instruction to access the V-Safe system.   Ms. Phyllis Myers was instructed to call 911 with any severe reactions post vaccine: Marland Kitchen Difficulty breathing  . Swelling of face and throat  . A fast heartbeat  . A bad rash all over body  . Dizziness and weakness   Immunizations Administered    Name Date Dose VIS Date Route   Pfizer COVID-19 Vaccine 01/07/2020 10:24 AM 0.3 mL 09/11/2019 Intramuscular   Manufacturer: [employer], [employer]   Lot: [id number]   NDC: [address]-0

## 2020-02-02 ENCOUNTER — Ambulatory Visit: Payer: 59 | Attending: Internal Medicine

## 2020-02-02 DIAGNOSIS — Z23 Encounter for immunization: Secondary | ICD-10-CM

## 2020-02-02 NOTE — Progress Notes (Signed)
   Covid-19 Vaccination Clinic  Name:  AHMONI EDGE    MRN: [id number] DOB: 05-17-1981  02/02/2020  Ms. Bozard was observed post Covid-19 immunization for 15 minutes without incident. She was provided with Vaccine Information Sheet and instruction to access the V-Safe system.   Ms. Pinch was instructed to call 911 with any severe reactions post vaccine: Marland Kitchen Difficulty breathing  . Swelling of face and throat  . A fast heartbeat  . A bad rash all over body  . Dizziness and weakness   Immunizations Administered    Name Date Dose VIS Date Route   Pfizer COVID-19 Vaccine 02/02/2020  4:13 PM 0.3 mL 11/25/2018 Intramuscular   Manufacturer: [employer], [employer]   Lot: [id number]   NDC: [address]-6

## 2020-03-03 ENCOUNTER — Encounter: Payer: Self-pay | Admitting: Family Medicine

## 2020-03-03 ENCOUNTER — Encounter: Payer: 59 | Admitting: Family Medicine

## 2020-03-05 NOTE — Progress Notes (Signed)
This encounter was created in error - please disregard.

## 2020-04-15 ENCOUNTER — Other Ambulatory Visit: Payer: Self-pay | Admitting: Family Medicine

## 2020-05-06 ENCOUNTER — Other Ambulatory Visit: Payer: Self-pay | Admitting: *Deleted

## 2020-05-06 DIAGNOSIS — Z148 Genetic carrier of other disease: Secondary | ICD-10-CM

## 2020-05-06 NOTE — Progress Notes (Signed)
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  INTERVAL HISTORY: Patient was last evaluated in clinic in August 2020.  She is referred back for rising iron stores and consideration of phlebotomy.  She currently feels well and is at her baseline despite taking Humira, she still has significant joint pain from her rheumatoid arthritis. She has no neurologic complaints.  She denies any recent fevers or illnesses.  She has a good appetite and denies weight loss.  She has no chest pain, shortness of breath, cough, or hemoptysis.  She has no nausea, vomiting, constipation, or diarrhea.  She has no urinary complaints.  Patient offers no further specific complaints today.  REVIEW OF SYSTEMS:   Review of Systems  Constitutional: Negative.  Negative for fever, malaise/fatigue and weight loss.  Respiratory: Negative.  Negative for cough, hemoptysis and shortness of breath.   Cardiovascular: Negative.  Negative for chest pain and leg swelling.  Gastrointestinal: Negative.  Negative for abdominal pain.  Genitourinary: Negative.  Negative for dysuria.  Musculoskeletal: Positive for joint pain.  Skin: Negative.  Negative for rash.  Neurological: Negative.  Negative for dizziness, focal weakness, weakness and headaches.  Psychiatric/Behavioral: Negative.  The patient is not nervous/anxious.     As per HPI. Otherwise, a complete review of systems is negative.  PAST MEDICAL HISTORY: Past Medical History:  Diagnosis Date  . Arthritis   . Depression, major, single episode, mild ([facility]) 12/20/2016  . Generalized anxiety disorder 09/08/2013  . Hypertension    not on medications at this  time  . Panic disorder 07/21/2014  . 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 [name]). Now off enbrel, methotrexate, uses indomethacin and hydrocodone rarely for pain.     PAST SURGICAL HISTORY: Past Surgical History:  Procedure Laterality Date  . PERONEAL NERVE DECOMPRESSION Left 12/14/2019   Procedure: PERONEAL NERVE DECOMPRESSION;  Surgeon: [name], MD;  Location: [facility] ORS;  Service: Neurosurgery;  Laterality: Left;  . WISDOM TOOTH EXTRACTION      FAMILY HISTORY: Family History  Problem Relation Age of Onset  . Pulmonary Hypertension Mother   . Cardiomyopathy Mother   . Heart disease Mother   . Heart disease Father 62       MI  . Heart disease Maternal Grandfather   . Heart disease Paternal Grandfather     ADVANCED DIRECTIVES (Y/N):  N  HEALTH MAINTENANCE: Social History   Tobacco Use  . Smoking status: Never Smoker  . Smokeless tobacco: Never Used  Vaping Use  . Vaping Use: Never used  Substance Use Topics  . Alcohol use: Yes    Alcohol/week: 4.0 standard drinks    Types: 4 Standard drinks or equivalent per week    Comment: occ  . Drug use: No     Colonoscopy:  PAP:  Bone density:  Lipid panel:  Allergies  Allergen Reactions  . Amoxicillin     Muscle pain  . Sulfasalazine     Other reaction(s): Headache  . Naproxen Sodium Rash    Current Outpatient Medications  Medication Sig Dispense Refill  . cholecalciferol (VITAMIN D3) 25 MCG (1000 UNIT) tablet Take 1,000 Units by mouth daily.    . cyanocobalamin (,VITAMIN B-12,) 1000 MCG/ML  injection Inject 1,000 mcg into the muscle every 30 (thirty) days.    . diazepam (VALIUM) 5 MG tablet Take 1 tablet (5 mg total) by mouth every 8 (eight) hours as needed for anxiety. 30 tablet 2  . etodolac (LODINE) 400 MG tablet Take 400 mg by mouth 2 (two) times daily.    Marland Kitchen HUMIRA PEN 40 MG/0.4ML PNKT SMARTSIG:40 Milligram(s) SUB-Q Every 2 Weeks    . levonorgestrel (MIRENA) 20 MCG/24HR  IUD 1 each by Intrauterine route once.    Marland Kitchen omeprazole (PRILOSEC) 40 MG capsule TAKE 1 CAPSULE(40 MG) BY MOUTH DAILY 90 capsule 3  . sertraline (ZOLOFT) 25 MG tablet Take 1 tablet (25 mg total) by mouth daily. 30 tablet 2   No current facility-administered medications for this visit.    OBJECTIVE: Vitals:   05/10/20 1358  BP: 127/90  Pulse: 76  Temp: 98.8 F (37.1 C)  SpO2: 100%     Body mass index is 31.97 kg/m.    ECOG FS:0 - Asymptomatic  General: Well-developed, well-nourished, no acute distress. Eyes: Pink conjunctiva, anicteric sclera. HEENT: Normocephalic, moist mucous membranes. Lungs: No audible wheezing or coughing. Heart: Regular rate and rhythm. Abdomen: Soft, nontender, no obvious distention. Musculoskeletal: No edema, cyanosis, or clubbing. Neuro: Alert, answering all questions appropriately. Cranial nerves grossly intact. Skin: No rashes or petechiae noted. Psych: Normal affect.   LAB RESULTS:  Lab Results  Component Value Date   NA 138 12/14/2019   K 3.2 (L) 12/14/2019   CL 103 12/14/2019   CO2 26 12/11/2019   GLUCOSE 108 (H) 12/14/2019   BUN 4 (L) 12/14/2019   CREATININE 0.50 12/14/2019   CALCIUM 8.7 (L) 12/11/2019   PROT 7.0 02/13/2018   ALBUMIN 4.2 02/13/2018   AST 26 02/13/2018   ALT 24 02/13/2018   ALKPHOS 48 02/13/2018   BILITOT 0.8 02/13/2018   GFRNONAA >60 12/11/2019   GFRAA >60 12/11/2019    Lab Results  Component Value Date   WBC 9.0 05/10/2020   NEUTROABS 6.3 05/10/2020   HGB 15.6 (H) 05/10/2020   HCT 41.7 05/10/2020   MCV 98.1 05/10/2020   PLT 175 05/10/2020   Lab Results  Component Value Date   IRON 166 05/15/2019   TIBC 390 05/15/2019   IRONPCTSAT 43 (H) 05/15/2019   Lab Results  Component Value Date   FERRITIN 633 (H) 05/15/2019     STUDIES: No results found.  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 Heterozygote for hemochromatosis with single C282Y gene  mutation: Patient's most recent laboratory work on April 08, 2020 revealed a total iron of 226, a percent saturation of 59% and a ferritin of 1161.  These have trended up significantly over the past year with these results were 166, 43%, and 633 respectively.  Patient has agreed to proceed with 500 mL phlebotomy today.  Return to clinic monthly for phlebotomy and then in 4 months for laboratory work, further evaluation, and continuation of treatment if needed.   2.  Elevated liver enzymes: Resolved. 3.  Rheumatoid arthritis: Continue Humira as per rheumatology. 4.  Elevated ferritin: May be difficult to follow ferritin for her iron low given that it may be falsely elevated as an acute phase reactant secondary to her rheumatoid arthritis.  Monitor.  I spent a total of 30 minutes reviewing chart data, face-to-face evaluation with the patient, counseling and coordination of care as detailed above.   [name], MD 05/11/2020 6:24 AM

## 2020-05-10 ENCOUNTER — Inpatient Hospital Stay: Payer: 59

## 2020-05-10 ENCOUNTER — Inpatient Hospital Stay: Payer: 59 | Attending: Oncology

## 2020-05-10 ENCOUNTER — Other Ambulatory Visit: Payer: Self-pay

## 2020-05-10 ENCOUNTER — Inpatient Hospital Stay (HOSPITAL_BASED_OUTPATIENT_CLINIC_OR_DEPARTMENT_OTHER): Payer: 59 | Admitting: Oncology

## 2020-05-10 VITALS — BP 127/90 | HR 76 | Temp 98.8°F | Wt 189.2 lb

## 2020-05-10 VITALS — BP 114/80 | HR 79 | Resp 18

## 2020-05-10 DIAGNOSIS — F329 Major depressive disorder, single episode, unspecified: Secondary | ICD-10-CM | POA: Insufficient documentation

## 2020-05-10 DIAGNOSIS — Z79899 Other long term (current) drug therapy: Secondary | ICD-10-CM | POA: Insufficient documentation

## 2020-05-10 DIAGNOSIS — I10 Essential (primary) hypertension: Secondary | ICD-10-CM | POA: Diagnosis not present

## 2020-05-10 DIAGNOSIS — Z148 Genetic carrier of other disease: Secondary | ICD-10-CM

## 2020-05-10 DIAGNOSIS — Z793 Long term (current) use of hormonal contraceptives: Secondary | ICD-10-CM | POA: Diagnosis not present

## 2020-05-10 DIAGNOSIS — Z8249 Family history of ischemic heart disease and other diseases of the circulatory system: Secondary | ICD-10-CM | POA: Diagnosis not present

## 2020-05-10 DIAGNOSIS — M069 Rheumatoid arthritis, unspecified: Secondary | ICD-10-CM | POA: Insufficient documentation

## 2020-05-10 LAB — CBC WITH DIFFERENTIAL/PLATELET
Abs Immature Granulocytes: 0.03 10*3/uL (ref 0.00–0.07)
Basophils Absolute: 0.1 10*3/uL (ref 0.0–0.1)
Basophils Relative: 1 %
Eosinophils Absolute: 0.1 10*3/uL (ref 0.0–0.5)
Eosinophils Relative: 1 %
HCT: 41.7 % (ref 36.0–46.0)
Hemoglobin: 15.6 g/dL — ABNORMAL HIGH (ref 12.0–15.0)
Immature Granulocytes: 0 %
Lymphocytes Relative: 20 %
Lymphs Abs: 1.8 10*3/uL (ref 0.7–4.0)
MCH: 36.7 pg — ABNORMAL HIGH (ref 26.0–34.0)
MCHC: 37.4 g/dL — ABNORMAL HIGH (ref 30.0–36.0)
MCV: 98.1 fL (ref 80.0–100.0)
Monocytes Absolute: 0.7 10*3/uL (ref 0.1–1.0)
Monocytes Relative: 8 %
Neutro Abs: 6.3 10*3/uL (ref 1.7–7.7)
Neutrophils Relative %: 70 %
Platelets: 175 10*3/uL (ref 150–400)
RBC: 4.25 MIL/uL (ref 3.87–5.11)
RDW: 11 % — ABNORMAL LOW (ref 11.5–15.5)
WBC: 9 10*3/uL (ref 4.0–10.5)
nRBC: 0 % (ref 0.0–0.2)

## 2020-05-10 NOTE — Progress Notes (Signed)
Pt states had B12 injection a [facility] yesterday and also taking B12 orally daily now.

## 2020-06-13 ENCOUNTER — Other Ambulatory Visit: Payer: Self-pay

## 2020-06-13 ENCOUNTER — Inpatient Hospital Stay: Payer: 59 | Attending: Oncology

## 2020-06-13 VITALS — BP 115/77 | HR 83 | Temp 97.3°F | Resp 20

## 2020-06-13 DIAGNOSIS — Z148 Genetic carrier of other disease: Secondary | ICD-10-CM

## 2020-06-28 ENCOUNTER — Other Ambulatory Visit: Payer: Self-pay | Admitting: Family Medicine

## 2020-06-28 NOTE — Telephone Encounter (Signed)
Last office visit 01/04/2020 for CPE.  Last refilled 01/04/2020 for #30 with 2 refills.  No future appointments with PCP.

## 2020-06-30 ENCOUNTER — Telehealth: Payer: 59 | Admitting: Internal Medicine

## 2020-07-11 ENCOUNTER — Inpatient Hospital Stay: Payer: 59

## 2020-07-19 ENCOUNTER — Inpatient Hospital Stay: Payer: 59 | Attending: Oncology

## 2020-07-19 ENCOUNTER — Other Ambulatory Visit: Payer: Self-pay

## 2020-07-19 VITALS — BP 121/83 | HR 59 | Temp 96.4°F | Resp 20

## 2020-07-19 DIAGNOSIS — Z148 Genetic carrier of other disease: Secondary | ICD-10-CM

## 2020-07-19 NOTE — Progress Notes (Signed)
1525 Therapeutic phlebotomy attempted per MD order using 20 gauge PIV in right hand. Pt reports that her hand was numb and hurting. Only 100 cc blood had drained and no longer was draining at this point. Pt states she has next appt in 3 weeks.  Dr. Orlie Dakin aware, and okay to discharge pt today, and pt to keep appts as scheduled. Pt aware and verbalizes understanding. Pt and VS stable at discharge.

## 2020-08-01 ENCOUNTER — Telehealth: Payer: Self-pay

## 2020-08-01 NOTE — Telephone Encounter (Signed)
[facility] - Client TELEPHONE ADVICE RECORD Access[employer]urse Patient Name: Phyllis Myers Gender: Female DOB: 03-20-1981 Age: 39 Y 18 D Return Phone Number: [phone] (Primary), [phone] (Secondary) Address[place] [address] Client [facility] - Client Client Site [facility] - Day Physician [name], [name] - MD Contact Type Call Who Is Calling Patient / Member / Family / Caregiver Call Type Triage / Clinical Relationship To Patient Self Return Phone Number [phone] (Primary) Chief Complaint Blood Pressure High Reason for Call Symptomatic / Request for Health Information Initial Comment Caller states she has blurry vision and feels hot. She has been having issues with her BP. Translation No Nurse Assessment Nurse: Thana Farr, RN, Ladona Ridgel Date/Time [name] Time): 08/01/2020 3:11:12 PM Confirm and document reason for call. If symptomatic, describe symptoms. ---Caller states she has blurry vision. Not constant but comes and goes. Was treated with antibiotic's a couple weeks and cold symptoms got better. Eating, drinking, and urinating normally. New med started 30 days ago called rinvoq. Denies cough, congestion, fever, difficulty breathing, chest pain, dizzy, headache, or any other symptoms at this time. Does the patient have any new or worsening symptoms? ---Yes Will a triage be completed? ---Yes Related visit to physician within the last 2 weeks? ---No Does the PT have any chronic conditions? (i.e. diabetes, asthma, this includes High risk factors for pregnancy, etc.) ---Yes List chronic conditions. ---Anxiety, RA Is the patient pregnant or possibly pregnant? (Ask all females between the ages of 36-55) ---No Is this a behavioral health or substance abuse call? ---No Guidelines Guideline Title Affirmed Question Affirmed Notes Nurse Date/Time (Eastern Time) Vision Loss or  Change Many floaters in the eye [place], RNLadona Ridgel 08/01/2020 3:15:31 PM Disp. Time [name] Time) Disposition Final User 08/01/2020 3:17:58 PM See PCP within 24 Hours Yes Thana Farr, RN, Ladona Ridgel PLEASE NOTE: All timestamps contained within this report are represented as [place] Standard Time. CONFIDENTIALTY NOTICE: This fax transmission is intended only for the addressee. It contains information that is legally privileged, confidential or otherwise protected from use or disclosure. If you are not the intended recipient, you are strictly prohibited from reviewing, disclosing, copying using or disseminating any of this information or taking any action in reliance on or regarding this information. If you have received this fax in error, please notify us immediately by telephone so that we can arrange for its return to [place]. Phone: [phone], Toll-Free: [phone], Fax: [phone] Page: 2 of 2 Call Id: [id number] Caller Disagree/Comply Comply Caller Understands Yes PreDisposition Did not know what to do Care Advice Given Per Guideline SEE PCP WITHIN 24 HOURS: * You become worse CALL BACK IF: Referrals REFERRED TO PCP OFFICE

## 2020-08-01 NOTE — Telephone Encounter (Signed)
Pt already has appt scheduled with Dr Patsy Lager on 08/03/20 at 10:40.

## 2020-08-02 NOTE — Progress Notes (Signed)
Phyllis Strand T. Gavriella Hearst, MD, [place] at [facility] [place], [address]  Phone: [phone]  FAX: [phone]  Phyllis Myers - 39 y.o. female  MRN [id number]  Date of Birth: 1981-05-04  Date: 08/03/2020  PCP: Owens Loffler, MD  Referral: Owens Loffler, MD  Chief Complaint  Patient presents with  . Blood Pressure Check  . Blurred Vision    This visit occurred during the SARS-CoV-2 public health emergency.  Safety protocols were in place, including screening questions prior to the visit, additional usage of staff PPE, and extensive cleaning of exam room while observing appropriate contact time as indicated for disinfecting solutions.   Subjective:   Phyllis Myers is a 38 y.o. very pleasant female patient with Body mass index is 29.91 kg/m. who presents with the following:  She presents for evaluation of?  High blood pressure versus ongoing anxiety.  She is having some intermittent tachycardia and she has had blurred vision as well as sometimes where her vision goes completely black and she is having some sensation as if she is going to faint.  Friday had a bad panic attack.  This has not happened in a very long time  Saw Rheum yesterday.  This is for her reactive arthritis, and they stopped one of her recently added DMARDs. BP was ok  Visoin went out of the blue and completely black. Lasted for not that long - 2-3 mins. Vision was a little bit blurred but then took a valium after this and she took a little bit better.  Never been to the eye MD only once, but not in recent times.  Vision has blurred a lot, but about the 3rd time that everything went black.  Was going fast, her pulse rate was racing at the time.  Normal MRI and w and wo last year.  She has seen neurology as well, and they do not think that she has an underlying neurological condition.  She previously  did see cardiology and had a normal stress test as well as an echocardiogram. She has a strong family history of cardiac disease, and her husband died of sudden death from a cardiac standpoint several years ago.   Review of Systems is noted in the HPI, as appropriate She denies chest pain.  No shortness of breath.  No nausea, vomiting.  Objective:   BP 110/80   Pulse 69   Temp 97.8 F (36.6 C) (Temporal)   Ht 5' 4.5" (1.638 m)   Wt 177 lb (80.3 kg)   SpO2 100%   BMI 29.91 kg/m   Orthostatic VS for the past 24 hrs:  BP- Lying Pulse- Lying BP- Sitting Pulse- Sitting BP- Standing at 0 minutes Pulse- Standing at 0 minutes  08/03/20 1148 113/75 65 115/85 73 119/82 86    GEN: WDWN, NAD, Non-toxic HEENT: Atraumatic, Normocephalic. Neck supple. No masses. CV: RRR, No M/G/R. No JVD. No thrill. No extra heart sounds. PULM: CTA B, no wheezes, crackles, rhonchi. No retractions. No resp. distress. No accessory muscle use. EXTR: No c/c/e NEURO Normal gait.  PSYCH: Normally interactive. Conversant.    Laboratory and Imaging Data: Lab Review:  CBC EXTENDED Latest Ref Rng & Units 05/10/2020 12/14/2019 12/11/2019  WBC 4.0 - 10.5 K/uL 9.0 - 4.1  RBC 3.87 - 5.11 MIL/uL 4.25 - 3.89  HGB 12.0 - 15.0 g/dL 15.6(H) 13.9 14.2  HCT 36 - 46 % 41.7  41.0 39.5  PLT 150 - 400 K/uL 175 - 136(L)  NEUTROABS 1.7 - 7.7 K/uL 6.3 - -  LYMPHSABS 0.7 - 4.0 K/uL 1.8 - -    BMP Latest Ref Rng & Units 12/14/2019 12/11/2019 02/13/2018  Glucose 70 - 99 mg/dL 108(H) 110(H) 87  BUN 6 - 20 mg/dL 4(L) <5(L) 11  Creatinine 0.44 - 1.00 mg/dL 0.50 0.54 0.62  Sodium 135 - 145 mmol/L 138 136 141  Potassium 3.5 - 5.1 mmol/L 3.2(L) 3.3(L) 3.9  Chloride 98 - 111 mmol/L 103 99 105  CO2 22 - 32 mmol/L - 26 24  Calcium 8.9 - 10.3 mg/dL - 8.7(L) 9.1    Hepatic Function Latest Ref Rng & Units 02/13/2018 02/08/2016 06/08/2013  Total Protein 6.0 - 8.3 g/dL 7.0 7.5 7.1  Albumin 3.5 - 5.2 g/dL 4.2 4.9 4.0  AST 0 - 37 U/L 26 32 19    ALT 0 - 35 U/L 24 33 33  Alk Phosphatase 39 - 117 U/L 48 46 54  Total Bilirubin 0.2 - 1.2 mg/dL 0.8 1.0 0.4  Bilirubin, Direct 0.0 - 0.3 mg/dL 0.2 0.2 -    Lab Results  Component Value Date   CHOL 173 02/13/2018   Lab Results  Component Value Date   HDL 55.20 02/13/2018   Lab Results  Component Value Date   LDLCALC 80 02/13/2018   Lab Results  Component Value Date   TRIG 191.0 (H) 02/13/2018   Lab Results  Component Value Date   CHOLHDL 3 02/13/2018   No results for input(s): PSA in the last 72 hours. No results found for: HCVAB Lab Results  Component Value Date   VD25OH 43.32 02/08/2016     No results found for: HGBA1C Lab Results  Component Value Date   LDLCALC 80 02/13/2018   CREATININE 0.50 12/14/2019    Assessment and Plan:     ICD-10-CM   1. Vision loss, bilateral  H54.3 Ambulatory referral to Cardiology  2. Generalized anxiety disorder  F41.1   3. Panic disorder  F41.0   4. Paroxysmal supraventricular tachycardia (HCC)  I47.1 EKG 12-Lead  5. Pre-syncope  R55 EKG 12-Lead    Ambulatory referral to Cardiology    T4, free    T3, free    TSH  6. Tachycardia  R00.0 Ambulatory referral to Cardiology    T4, free    T3, free    TSH   Total encounter time: 30 minutes. This includes total time spent on the day of encounter.   EKG: Normal sinus rhythm. Normal axis, normal R wave progression, No acute ST elevation or depression.   She is having complete vision loss with dizziness and presyncope without collapse.  This is happened multiple times.  She also has had multiple times with some blurred vision.  She has had a fairly detailed neurological work-up including a negative MRI of the brain with and without contrast, so I think that this can be pretty reassuring that she does not have any underlying neurological pathology.  She did see cardiology before, and she did have a normal stress test and a normal echo.  There is seem to be a correlation with her  tachycardia that can last multiple minutes and her blurred vision and her loss of vision.  I worried that this may be from an underlying cardiac pathology or arrhythmia.  I think it is reasonable to ask for cardiology's opinion again.  Generally, her anxiety and panic disorder has been much improved compared  to prior years.   No orders of the defined types were placed in this encounter.  Medications Discontinued During This Encounter  Medication Reason  . levonorgestrel (MIRENA) 20 MCG/24HR IUD Change in therapy  . RINVOQ 15 MG TB24 Discontinued by provider  . HUMIRA PEN 40 MG/0.4ML PNKT Completed Course   Orders Placed This Encounter  Procedures  . T4, free  . T3, free  . TSH  . Ambulatory referral to Cardiology  . EKG 12-Lead    Follow-up: No follow-ups on file.  Signed,  Maud Deed. [name], MD   Outpatient Encounter Medications as of 08/03/2020  Medication Sig  . cholecalciferol (VITAMIN D3) 25 MCG (1000 UNIT) tablet Take 1,000 Units by mouth daily.  . cyanocobalamin (,VITAMIN B-12,) 1000 MCG/ML injection Inject 1,000 mcg into the muscle every 30 (thirty) days.  . diazepam (VALIUM) 5 MG tablet TAKE 1 TABLET(5 MG) BY MOUTH EVERY 8 HOURS AS NEEDED FOR ANXIETY  . etodolac (LODINE) 400 MG tablet Take 400 mg by mouth 2 (two) times daily.  Marland Kitchen levonorgestrel (KYLEENA) 19.5 MG IUD Kyleena 17.5 mcg/24 hrs (71yr) 19.535mintrauterine device  . omeprazole (PRILOSEC) 40 MG capsule TAKE 1 CAPSULE(40 MG) BY MOUTH DAILY  . sertraline (ZOLOFT) 25 MG tablet Take 1 tablet (25 mg total) by mouth daily.  . [DISCONTINUED] diazepam (VALIUM) 5 MG tablet TAKE 1 TABLET BY MOUTH THREE TIMES DAILY AS NEEDED  . [DISCONTINUED] HUMIRA PEN 40 MG/0.4ML PNKT SMARTSIG:40 Milligram(s) SUB-Q Every 2 Weeks  . [DISCONTINUED] levonorgestrel (MIRENA) 20 MCG/24HR IUD 1 each by Intrauterine route once.  . [DISCONTINUED] RINVOQ 15 MG TB24 Take 1 tablet by mouth daily.   No facility-administered encounter medications  on file as of 08/03/2020.

## 2020-08-03 ENCOUNTER — Encounter: Payer: Self-pay | Admitting: Family Medicine

## 2020-08-03 ENCOUNTER — Other Ambulatory Visit: Payer: Self-pay

## 2020-08-03 ENCOUNTER — Ambulatory Visit (INDEPENDENT_AMBULATORY_CARE_PROVIDER_SITE_OTHER): Payer: 59 | Admitting: Family Medicine

## 2020-08-03 VITALS — BP 110/80 | HR 69 | Temp 97.8°F | Ht 64.5 in | Wt 177.0 lb

## 2020-08-03 DIAGNOSIS — R000 Tachycardia, unspecified: Secondary | ICD-10-CM

## 2020-08-03 DIAGNOSIS — I471 Supraventricular tachycardia: Secondary | ICD-10-CM | POA: Diagnosis not present

## 2020-08-03 DIAGNOSIS — H543 Unqualified visual loss, both eyes: Secondary | ICD-10-CM

## 2020-08-03 DIAGNOSIS — F411 Generalized anxiety disorder: Secondary | ICD-10-CM

## 2020-08-03 DIAGNOSIS — R55 Syncope and collapse: Secondary | ICD-10-CM | POA: Diagnosis not present

## 2020-08-03 DIAGNOSIS — F410 Panic disorder [episodic paroxysmal anxiety] without agoraphobia: Secondary | ICD-10-CM

## 2020-08-03 LAB — T4, FREE: Free T4: 0.65 ng/dL (ref 0.60–1.60)

## 2020-08-03 LAB — T3, FREE: T3, Free: 3.3 pg/mL (ref 2.3–4.2)

## 2020-08-03 LAB — TSH: TSH: 1.71 u[IU]/mL (ref 0.35–4.50)

## 2020-08-15 ENCOUNTER — Other Ambulatory Visit: Payer: Self-pay

## 2020-08-15 ENCOUNTER — Inpatient Hospital Stay: Payer: 59 | Attending: Oncology

## 2020-08-15 VITALS — BP 109/75 | HR 83 | Temp 98.2°F | Resp 18

## 2020-08-15 DIAGNOSIS — Z148 Genetic carrier of other disease: Secondary | ICD-10-CM

## 2020-08-15 NOTE — Progress Notes (Signed)
1540: Therapeutic phlebotomy performed per MD order, removing 500 cc using 20 gauge PIV in left FA. Pt tolerated procedure well and monitored 20 minutes post procedure. Pt and VS stable.  Pt stable at discharge.

## 2020-08-18 ENCOUNTER — Other Ambulatory Visit: Payer: Self-pay

## 2020-08-18 ENCOUNTER — Ambulatory Visit (INDEPENDENT_AMBULATORY_CARE_PROVIDER_SITE_OTHER): Payer: 59 | Admitting: Cardiovascular Disease

## 2020-08-18 ENCOUNTER — Ambulatory Visit (INDEPENDENT_AMBULATORY_CARE_PROVIDER_SITE_OTHER): Payer: 59

## 2020-08-18 ENCOUNTER — Telehealth: Payer: Self-pay | Admitting: Cardiovascular Disease

## 2020-08-18 ENCOUNTER — Encounter: Payer: Self-pay | Admitting: Cardiovascular Disease

## 2020-08-18 VITALS — BP 106/72 | HR 66 | Ht 65.0 in | Wt 179.0 lb

## 2020-08-18 DIAGNOSIS — R55 Syncope and collapse: Secondary | ICD-10-CM | POA: Diagnosis not present

## 2020-08-18 DIAGNOSIS — H538 Other visual disturbances: Secondary | ICD-10-CM

## 2020-08-18 NOTE — Patient Instructions (Signed)
Medication Instructions:  Your physician recommends that you continue on your current medications as directed. Please refer to the Current Medication list given to you today.  *If you need a refill on your cardiac medications before your next appointment, please call your pharmacy*   Lab Work: None ordered If you have labs (blood work) drawn today and your tests are completely normal, you will receive your results only by: Marland Kitchen MyChart Message (if you have MyChart) OR . A paper copy in the mail If you have any lab test that is abnormal or we need to change your treatment, we will call you to review the results.   Testing/Procedures: Your physician has requested that you have a carotid duplex. This test is an ultrasound of the carotid arteries in your neck. It looks at blood flow through these arteries that supply the brain with blood. Allow one hour for this exam. There are no restrictions or special instructions.   Your physician has recommended that you wear a Zio monitor. (To be worn for 14 days) This monitor is a medical device that records the heart's electrical activity. Doctors most often use these monitors to diagnose arrhythmias. Arrhythmias are problems with the speed or rhythm of the heartbeat. The monitor is a small device applied to your chest. You can wear one while you do your normal daily activities. While wearing this monitor if you have any symptoms to push the button and record what you felt. Once you have worn this monitor for the period of time provider prescribed (Usually 14 days), you will return the monitor device in the postage paid box. Once it is returned they will download the data collected and provide [place] with a report which the provider will then review and we will call you with those results. Important tips:  1. Avoid showering during the first 24 hours of wearing the monitor. 2. Avoid excessive sweating to help maximize wear time. 3. Do not submerge the device, no  hot tubs, and no swimming pools. 4. Keep any lotions or oils away from the patch. 5. After 24 hours you may shower with the patch on. Take brief showers with your back facing the shower head.  6. Do not remove patch once it has been placed because that will interrupt data and decrease adhesive wear time. 7. Push the button when you have any symptoms and write down what you were feeling. 8. Once you have completed wearing your monitor, remove and place into box which has postage paid and place in your outgoing mailbox.  9. If for some reason you have misplaced your box then call our office and we can provide another box and/or mail it off for you.        Follow-Up: At [facility], you and your health needs are our priority.  As part of our continuing mission to provide you with exceptional heart care, we have created designated Provider Care Teams.  These Care Teams include your primary Cardiologist (physician) and Advanced Practice Providers (APPs -  Physician Assistants and Nurse Practitioners) who all work together to provide you with the care you need, when you need it.  We recommend signing up for the patient portal called "MyChart".  Sign up information is provided on this After Visit Summary.  MyChart is used to connect with patients for Virtual Visits (Telemedicine).  Patients are able to view lab/test results, encounter notes, upcoming appointments, etc.  Non-urgent messages can be sent to your provider as well.  To learn more about what you can do with MyChart, go to [email or web address].    Your next appointment:   As needed   The format for your next appointment:   In Person  Provider:   You may see Dr. Kirke Corin or one of the following Advanced Practice Providers on your designated Care Team:    Nicolasa Ducking, NP  Eula Listen, PA-C  Marisue Ivan, PA-C  Cadence Manton, [place]  Gillian Shields, NP    Other Instructions N/A

## 2020-08-18 NOTE — Telephone Encounter (Signed)
Patient calling to update  States that at appointment this morning she did not mention she is on Rinvoq medication This was not on her medication list

## 2020-08-18 NOTE — Progress Notes (Signed)
Cardiology Office Note   Date:  08/18/2020   ID:  Phyllis Myers, DOB August 06, 1981, MRN [id number]  PCP:  [name], MD  Cardiologist:   Lorine Bears, MD   Chief Complaint  Patient presents with  . New Patient (Initial Visit)    Referred by PCP for Presyncope, tach, Bilateral vision loss. Patient c.o pain under left breast. Meds reviewed verbally with patient.       History of Present Illness: Phyllis Myers is a 39 y.o. female who was referred by Dr. Dallas Schimke for evaluation of presyncope and transient visual loss.  She had complete neurologic work-up including negative MRI.  She was seen by me in 2014 for atypical chest pain and palpitations.  She underwent a treadmill stress test which was normal. Echocardiogram showed normal LV systolic function with no valvular abnormalities and no evidence of pulmonary hypertension. Heart the monitor showed no significant arrhythmia. She has known family history of coronary artery disease. Her father died in his 72s of myocardial infarction. Mother died in her 4s from congestive heart failure and pulmonary hypertension. She is not a smoker. She had a repeat treadmill stress test in July 2018 which was normal.  She was able to exercise for 8 minutes and 42 seconds at that time.  She reports prolonged episodes of blurred vision associated with anxiety.  However, recently she started having transient loss of vision with these episodes.  She had 3 total episodes lasting for few seconds.  These episodes are usually associated with palpitations and tachycardia.  She denies exertional chest pain and has no shortness of breath.  She does feel more anxious than the usual.  Past Medical History:  Diagnosis Date  . Arthritis   . Depression, major, single episode, mild (HCC) 12/20/2016  . Generalized anxiety disorder 09/08/2013  . Hypertension    not on medications at this time  . Panic disorder 07/21/2014  . 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 [name]). Now off enbrel, methotrexate, uses indomethacin and hydrocodone rarely for pain.     Past Surgical History:  Procedure Laterality Date  . PERONEAL NERVE DECOMPRESSION Left 12/14/2019   Procedure: PERONEAL NERVE DECOMPRESSION;  Surgeon: [name], MD;  Location: [facility] ORS;  Service: Neurosurgery;  Laterality: Left;  . WISDOM TOOTH EXTRACTION       Current Outpatient Medications  Medication Sig Dispense Refill  . cyanocobalamin (,VITAMIN B-12,) 1000 MCG/ML injection Inject 1,000 mcg into the muscle every 30 (thirty) days.    . diazepam (VALIUM) 5 MG tablet TAKE 1 TABLET(5 MG) BY MOUTH EVERY 8 HOURS AS NEEDED FOR ANXIETY 30 tablet 2  . etodolac (LODINE) 400 MG tablet Take 400 mg by mouth 2 (two) times daily.    Marland Kitchen levonorgestrel (KYLEENA) 19.5 MG IUD Kyleena 17.5 mcg/24 hrs (39yrs) 19.5mg  intrauterine device    . omeprazole (PRILOSEC) 40 MG capsule TAKE 1 CAPSULE(40 MG) BY MOUTH DAILY 90 capsule 3  . sertraline (ZOLOFT) 25 MG tablet Take 1 tablet (25 mg total) by mouth daily. 30 tablet 2   No current facility-administered medications for this visit.    Allergies:   Amoxicillin, Other, and Naproxen sodium    Social History:  The patient  reports that she has never smoked. She has never used smokeless tobacco. She reports current alcohol use of about 4.0 standard drinks of alcohol per week. She reports that she does not use drugs.   Family History:  The patient's family history  includes Cardiomyopathy in her mother; Heart disease in her maternal grandfather, mother, and paternal grandfather; Heart disease (age of onset: 67) in her father; Pulmonary Hypertension in her mother.    ROS:  Please see the history of present illness.   Otherwise, review of systems are positive for none.   All other systems are reviewed and negative.    PHYSICAL EXAM: VS:  BP 106/72 (BP Location: Left Arm, Patient Position: Sitting, Cuff Size: Normal)    Pulse 66   Ht 5\' 5"  (1.651 m)   Wt 179 lb (81.2 kg)   SpO2 97%   BMI 29.79 kg/m  , BMI Body mass index is 29.79 kg/m. GEN: Well nourished, well developed, in no acute distress  HEENT: normal  Neck: no JVD, carotid bruits, or masses Cardiac: RRR; no  rubs, or gallops,no edema . No murmurs Respiratory:  clear to auscultation bilaterally, normal work of breathing GI: soft, nontender, nondistended, + BS MS: no deformity or atrophy  Skin: warm and dry, no rash Neuro:  Strength and sensation are intact Psych: euthymic mood, full affect   EKG:  EKG is ordered today. The ekg ordered today demonstrates normal sinus rhythm with sinus arrhythmia and no significant ST or T wave changes.   Recent Labs: 12/14/2019: BUN 4; Creatinine, Ser 0.50; Potassium 3.2; Sodium 138 05/10/2020: Hemoglobin 15.6; Platelets 175 08/03/2020: TSH 1.71    Lipid Panel    Component Value Date/Time   CHOL 173 02/13/2018 0859   TRIG 191.0 (H) 02/13/2018 0859   HDL 55.20 02/13/2018 0859   CHOLHDL 3 02/13/2018 0859   VLDL 38.2 02/13/2018 0859   LDLCALC 80 02/13/2018 0859   LDLDIRECT 150.1 06/08/2013 0809      Wt Readings from Last 3 Encounters:  08/18/20 179 lb (81.2 kg)  08/03/20 177 lb (80.3 kg)  05/10/20 189 lb 3.2 oz (85.8 kg)        PAD Screen 04/16/2017  Previous PAD dx? No  Previous surgical procedure? No  Pain with walking? No  Feet/toe relief with dangling? No  Painful, non-healing ulcers? No  Extremities discolored? No      ASSESSMENT AND PLAN:  1.  Dizziness and vision loss: I think we have to exclude carotid disease such as fibromuscular dysplasia.  Recommend carotid Doppler which was ordered today.  2.  Palpitations: She reports significant palpitations and tachycardia during these episodes and thus I am going to obtain a 2-week outpatient ZIO monitor to exclude arrhythmia as a culprit.  If these tests come back unremarkable, no further cardiac work-up is needed.  3.  Anxiety and  panic attacks: This could be the cause of her symptoms but we have to exclude underlying abnormalities.    Disposition:   FU with me as needed.   Signed,  04/18/2017, MD  08/18/2020 9:23 AM    [facility]

## 2020-08-18 NOTE — Telephone Encounter (Signed)
Called the patient to get more info (dosage and frequency). Patient is taking Rinvoq 15mg  daily prn for RA.  Patients medication list updated.  FYI fwd to Dr. .

## 2020-09-01 NOTE — Addendum Note (Signed)
Addended by: Margrett Rud on: 09/01/2020 04:28 PM   Modules accepted: Orders

## 2020-09-08 ENCOUNTER — Other Ambulatory Visit: Payer: Self-pay | Admitting: *Deleted

## 2020-09-08 DIAGNOSIS — Z148 Genetic carrier of other disease: Secondary | ICD-10-CM

## 2020-09-09 ENCOUNTER — Inpatient Hospital Stay: Payer: 59 | Attending: Oncology

## 2020-09-09 DIAGNOSIS — Z148 Genetic carrier of other disease: Secondary | ICD-10-CM | POA: Insufficient documentation

## 2020-09-09 DIAGNOSIS — M069 Rheumatoid arthritis, unspecified: Secondary | ICD-10-CM | POA: Diagnosis not present

## 2020-09-09 DIAGNOSIS — I10 Essential (primary) hypertension: Secondary | ICD-10-CM | POA: Diagnosis not present

## 2020-09-09 DIAGNOSIS — Z793 Long term (current) use of hormonal contraceptives: Secondary | ICD-10-CM | POA: Insufficient documentation

## 2020-09-09 DIAGNOSIS — R748 Abnormal levels of other serum enzymes: Secondary | ICD-10-CM | POA: Insufficient documentation

## 2020-09-09 DIAGNOSIS — Z79899 Other long term (current) drug therapy: Secondary | ICD-10-CM | POA: Diagnosis not present

## 2020-09-09 DIAGNOSIS — R7989 Other specified abnormal findings of blood chemistry: Secondary | ICD-10-CM | POA: Insufficient documentation

## 2020-09-09 LAB — CBC WITH DIFFERENTIAL/PLATELET
Abs Immature Granulocytes: 0.02 10*3/uL (ref 0.00–0.07)
Basophils Absolute: 0.1 10*3/uL (ref 0.0–0.1)
Basophils Relative: 1 %
Eosinophils Absolute: 0.1 10*3/uL (ref 0.0–0.5)
Eosinophils Relative: 2 %
HCT: 39.5 % (ref 36.0–46.0)
Hemoglobin: 14.4 g/dL (ref 12.0–15.0)
Immature Granulocytes: 0 %
Lymphocytes Relative: 16 %
Lymphs Abs: 1.1 10*3/uL (ref 0.7–4.0)
MCH: 37.1 pg — ABNORMAL HIGH (ref 26.0–34.0)
MCHC: 36.5 g/dL — ABNORMAL HIGH (ref 30.0–36.0)
MCV: 101.8 fL — ABNORMAL HIGH (ref 80.0–100.0)
Monocytes Absolute: 0.7 10*3/uL (ref 0.1–1.0)
Monocytes Relative: 11 %
Neutro Abs: 4.4 10*3/uL (ref 1.7–7.7)
Neutrophils Relative %: 70 %
Platelets: 157 10*3/uL (ref 150–400)
RBC: 3.88 MIL/uL (ref 3.87–5.11)
RDW: 12.9 % (ref 11.5–15.5)
WBC: 6.4 10*3/uL (ref 4.0–10.5)
nRBC: 0 % (ref 0.0–0.2)

## 2020-09-09 LAB — IRON AND TIBC
Iron: 180 ug/dL — ABNORMAL HIGH (ref 28–170)
Saturation Ratios: 43 % — ABNORMAL HIGH (ref 10.4–31.8)
TIBC: 421 ug/dL (ref 250–450)
UIBC: 241 ug/dL

## 2020-09-09 LAB — FERRITIN: Ferritin: 564 ng/mL — ABNORMAL HIGH (ref 11–307)

## 2020-09-12 ENCOUNTER — Inpatient Hospital Stay: Payer: 59

## 2020-09-12 ENCOUNTER — Inpatient Hospital Stay (HOSPITAL_BASED_OUTPATIENT_CLINIC_OR_DEPARTMENT_OTHER): Payer: 59 | Admitting: Oncology

## 2020-09-12 ENCOUNTER — Encounter: Payer: Self-pay | Admitting: Oncology

## 2020-09-12 VITALS — BP 126/84 | HR 66 | Temp 98.0°F | Resp 20 | Wt 177.0 lb

## 2020-09-12 DIAGNOSIS — Z148 Genetic carrier of other disease: Secondary | ICD-10-CM | POA: Diagnosis not present

## 2020-09-12 NOTE — Progress Notes (Signed)
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  INTERVAL HISTORY: [name] is a 39 year old female who presents to clinic for lab work and possible phlebotomy.  She was last seen in our clinic on 05/10/2020.  Last phlebotomy was on 07/19/2020 where 100 mL of blood was removed.  Unfortunately, they were unable to get the full 500 mL secondary to poor venous return.  She currently feels well and denies any hyperviscosity symptoms.  She is currently on Humira for RA. She has no neurologic complaints.  She denies any recent fevers or illnesses.  She has a good appetite and denies weight loss.  She has no chest pain, shortness of breath, cough, or hemoptysis.  She has no nausea, vomiting, constipation, or diarrhea.  She has no urinary complaints.  Patient offers no further specific complaints today.  REVIEW OF SYSTEMS:   Review of Systems  Constitutional: Negative.  Negative for fever, malaise/fatigue and weight loss.  Respiratory: Negative.  Negative for cough, hemoptysis and shortness of breath.   Cardiovascular: Negative.  Negative for chest pain and leg swelling.  Gastrointestinal: Negative.  Negative for abdominal pain.  Genitourinary: Negative.  Negative for dysuria.  Musculoskeletal: Positive for joint pain.  Skin: Negative.  Negative for rash.  Neurological: Negative.  Negative for dizziness, focal weakness, weakness and headaches.  Psychiatric/Behavioral: Negative.  The patient is not nervous/anxious.     As per HPI. Otherwise, a complete review of systems is negative.  PAST MEDICAL HISTORY: Past Medical History:  Diagnosis Date  . Arthritis   .  Depression, major, single episode, mild (HCC) 12/20/2016  . Generalized anxiety disorder 09/08/2013  . Hypertension    not on medications at this time  . Panic disorder 07/21/2014  . 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 [name]). Now off enbrel, methotrexate, uses indomethacin and hydrocodone rarely for pain.     PAST SURGICAL HISTORY: Past Surgical History:  Procedure Laterality Date  . PERONEAL NERVE DECOMPRESSION Left 12/14/2019   Procedure: PERONEAL NERVE DECOMPRESSION;  Surgeon: [name], MD;  Location: [facility] ORS;  Service: Neurosurgery;  Laterality: Left;  . Phyllis Myers TOOTH EXTRACTION      FAMILY HISTORY: Family History  Problem Relation Age of Onset  . Pulmonary Hypertension Mother   . Cardiomyopathy Mother   . Heart disease Mother   . Heart disease Father 52       MI  . Heart disease Maternal Grandfather   . Heart disease Paternal Grandfather     ADVANCED DIRECTIVES (Y/N):  N  HEALTH MAINTENANCE: Social History   Tobacco Use  . Smoking status: Never Smoker  . Smokeless tobacco: Never Used  Vaping Use  . Vaping Use: Never used  Substance Use Topics  . Alcohol use: Yes    Alcohol/week: 4.0 standard drinks    Types: 4 Standard drinks or equivalent per week    Comment: occ  . Drug use: No     Colonoscopy:  PAP:  Bone density:  Lipid panel:  Allergies  Allergen Reactions  . Amoxicillin     Muscle pain  . Other Other (See Comments)  . Naproxen Sodium Rash    Current Outpatient Medications  Medication Sig Dispense Refill  .  diazepam (VALIUM) 5 MG tablet TAKE 1 TABLET(5 MG) BY MOUTH EVERY 8 HOURS AS NEEDED FOR ANXIETY 30 tablet 2  . etodolac (LODINE) 400 MG tablet Take 400 mg by mouth 2 (two) times daily.    Marland Kitchen levonorgestrel (KYLEENA) 19.5 MG IUD Kyleena 17.5 mcg/24 hrs (45yrs) 19.5mg  intrauterine device    . omeprazole (PRILOSEC) 40 MG capsule TAKE 1 CAPSULE(40 MG) BY MOUTH DAILY 90 capsule 3  . sertraline (ZOLOFT) 25 MG  tablet Take 1 tablet (25 mg total) by mouth daily. 30 tablet 2  . Upadacitinib ER (RINVOQ) 15 MG TB24 Take 1 tablet by mouth daily as needed.     No current facility-administered medications for this visit.    OBJECTIVE: Vitals:   09/12/20 1455  BP: 126/84  Pulse: 66  Resp: 20  Temp: 98 F (36.7 C)     Body mass index is 29.45 kg/m.    ECOG FS:0 - Asymptomatic  Physical Exam Constitutional:      General: Vital signs are normal.     Appearance: Normal appearance.  HENT:     Head: Normocephalic and atraumatic.  Eyes:     Pupils: Pupils are equal, round, and reactive to light.  Cardiovascular:     Rate and Rhythm: Normal rate and regular rhythm.     Heart sounds: Normal heart sounds. No murmur heard.   Pulmonary:     Effort: Pulmonary effort is normal.     Breath sounds: Normal breath sounds. No wheezing.  Abdominal:     General: Bowel sounds are normal. There is no distension.     Palpations: Abdomen is soft.     Tenderness: There is no abdominal tenderness.  Musculoskeletal:        General: No edema. Normal range of motion.     Cervical back: Normal range of motion.  Skin:    General: Skin is warm and dry.     Findings: No rash.  Neurological:     Mental Status: She is alert and oriented to person, place, and time.  Psychiatric:        Judgment: Judgment normal.      LAB RESULTS:  Lab Results  Component Value Date   NA 138 12/14/2019   K 3.2 (L) 12/14/2019   CL 103 12/14/2019   CO2 26 12/11/2019   GLUCOSE 108 (H) 12/14/2019   BUN 4 (L) 12/14/2019   CREATININE 0.50 12/14/2019   CALCIUM 8.7 (L) 12/11/2019   PROT 7.0 02/13/2018   ALBUMIN 4.2 02/13/2018   AST 26 02/13/2018   ALT 24 02/13/2018   ALKPHOS 48 02/13/2018   BILITOT 0.8 02/13/2018   GFRNONAA >60 12/11/2019   GFRAA >60 12/11/2019    Lab Results  Component Value Date   WBC 6.4 09/09/2020   NEUTROABS 4.4 09/09/2020   HGB 14.4 09/09/2020   HCT 39.5 09/09/2020   MCV 101.8 (H) 09/09/2020    PLT 157 09/09/2020   Lab Results  Component Value Date   IRON 180 (H) 09/09/2020   TIBC 421 09/09/2020   IRONPCTSAT 43 (H) 09/09/2020   Lab Results  Component Value Date   FERRITIN 564 (H) 09/09/2020     STUDIES: No results found.  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 -Due to poor venous return, we have been unable to remove ordered 500 mL.  -Last had 100 mL  removed. -Labs from today show a hemoglobin of 14.4, hematocrit 39.5, platelet count 157, ferritin 564, saturation ratio 43%. -Proceed with phlebotomy today.   2.   Rheumatoid arthritis: -Continue Humira as per rheumatology.  3.  Elevated ferritin: -Multifactorial secondary to hemochromatosis and RA (acute phase reactant) -Patient relatively asymptomatic  Disposition: -RTC in 4 weeks for repeat labs and possible phlebotomy.  Addendum: -We were able to get 500 mL of blood return today.  Greater than 50% was spent in counseling and coordination of care with this patient including but not limited to discussion of the relevant topics above (See A&P) including, but not limited to diagnosis and management of acute and chronic medical conditions.    Mauro Kaufmann, NP 09/12/2020 3:02 PM

## 2020-09-12 NOTE — Progress Notes (Signed)
Patient denies any concerns today.  

## 2020-09-12 NOTE — Patient Instructions (Signed)

## 2020-09-12 NOTE — Progress Notes (Signed)
Tolerated 500 ml phlebotomy very well today. Felt fine at discharge. No complaints. VSS. Ambulatory.

## 2020-09-19 ENCOUNTER — Other Ambulatory Visit: Payer: Self-pay

## 2020-09-19 ENCOUNTER — Ambulatory Visit (INDEPENDENT_AMBULATORY_CARE_PROVIDER_SITE_OTHER): Payer: 59

## 2020-09-19 DIAGNOSIS — H538 Other visual disturbances: Secondary | ICD-10-CM

## 2020-09-29 ENCOUNTER — Telehealth: Payer: 59 | Admitting: Family Medicine

## 2020-10-12 ENCOUNTER — Other Ambulatory Visit: Payer: Self-pay | Admitting: *Deleted

## 2020-10-12 DIAGNOSIS — Z148 Genetic carrier of other disease: Secondary | ICD-10-CM

## 2020-10-12 NOTE — Progress Notes (Signed)
cb

## 2020-10-13 ENCOUNTER — Inpatient Hospital Stay: Payer: 59

## 2020-10-13 ENCOUNTER — Other Ambulatory Visit: Payer: Self-pay

## 2020-10-13 ENCOUNTER — Inpatient Hospital Stay: Payer: 59 | Attending: Oncology

## 2020-10-13 VITALS — BP 108/79 | HR 73 | Temp 98.3°F | Resp 18

## 2020-10-13 DIAGNOSIS — M069 Rheumatoid arthritis, unspecified: Secondary | ICD-10-CM | POA: Insufficient documentation

## 2020-10-13 DIAGNOSIS — Z148 Genetic carrier of other disease: Secondary | ICD-10-CM | POA: Diagnosis not present

## 2020-10-13 DIAGNOSIS — R748 Abnormal levels of other serum enzymes: Secondary | ICD-10-CM | POA: Insufficient documentation

## 2020-10-13 DIAGNOSIS — Z79899 Other long term (current) drug therapy: Secondary | ICD-10-CM | POA: Insufficient documentation

## 2020-10-13 DIAGNOSIS — R790 Abnormal level of blood mineral: Secondary | ICD-10-CM | POA: Insufficient documentation

## 2020-10-13 LAB — IRON AND TIBC
Iron: 99 ug/dL (ref 28–170)
Saturation Ratios: 23 % (ref 10.4–31.8)
TIBC: 426 ug/dL (ref 250–450)
UIBC: 327 ug/dL

## 2020-10-13 LAB — CBC WITH DIFFERENTIAL/PLATELET
Abs Immature Granulocytes: 0.02 10*3/uL (ref 0.00–0.07)
Basophils Absolute: 0.1 10*3/uL (ref 0.0–0.1)
Basophils Relative: 1 %
Eosinophils Absolute: 0.1 10*3/uL (ref 0.0–0.5)
Eosinophils Relative: 2 %
HCT: 39.2 % (ref 36.0–46.0)
Hemoglobin: 14 g/dL (ref 12.0–15.0)
Immature Granulocytes: 0 %
Lymphocytes Relative: 19 %
Lymphs Abs: 1.2 10*3/uL (ref 0.7–4.0)
MCH: 36.3 pg — ABNORMAL HIGH (ref 26.0–34.0)
MCHC: 35.7 g/dL (ref 30.0–36.0)
MCV: 101.6 fL — ABNORMAL HIGH (ref 80.0–100.0)
Monocytes Absolute: 0.6 10*3/uL (ref 0.1–1.0)
Monocytes Relative: 9 %
Neutro Abs: 4.5 10*3/uL (ref 1.7–7.7)
Neutrophils Relative %: 69 %
Platelets: 154 10*3/uL (ref 150–400)
RBC: 3.86 MIL/uL — ABNORMAL LOW (ref 3.87–5.11)
RDW: 11.2 % — ABNORMAL LOW (ref 11.5–15.5)
WBC: 6.5 10*3/uL (ref 4.0–10.5)
nRBC: 0 % (ref 0.0–0.2)

## 2020-10-13 LAB — FERRITIN: Ferritin: 377 ng/mL — ABNORMAL HIGH (ref 11–307)

## 2020-10-13 NOTE — Progress Notes (Signed)
Therapeutic phlebotomy performed to right wrist using 20g angiocath. removed. Pt tolerated procedure well. PO fluids provided.

## 2020-11-03 ENCOUNTER — Other Ambulatory Visit: Payer: Self-pay | Admitting: Obstetrics and Gynecology

## 2020-11-03 DIAGNOSIS — R928 Other abnormal and inconclusive findings on diagnostic imaging of breast: Secondary | ICD-10-CM

## 2020-11-08 ENCOUNTER — Telehealth: Payer: Self-pay | Admitting: Oncology

## 2020-11-08 NOTE — Telephone Encounter (Signed)
Pt stated that will be out of town indefinitely due to father's illness. Requested that we cancel her 2/10 phleb appt and if she is not able to keep her appt in March she will call back. Message sent to team.

## 2020-11-10 ENCOUNTER — Inpatient Hospital Stay: Payer: 59

## 2020-12-08 ENCOUNTER — Inpatient Hospital Stay: Payer: 59 | Attending: Oncology

## 2020-12-08 ENCOUNTER — Inpatient Hospital Stay: Payer: 59

## 2020-12-08 VITALS — BP 107/85 | HR 84 | Temp 98.4°F | Resp 18

## 2020-12-08 DIAGNOSIS — Z148 Genetic carrier of other disease: Secondary | ICD-10-CM

## 2020-12-08 LAB — IRON AND TIBC
Iron: 75 ug/dL (ref 28–170)
Saturation Ratios: 18 % (ref 10.4–31.8)
TIBC: 421 ug/dL (ref 250–450)
UIBC: 346 ug/dL

## 2020-12-08 LAB — CBC WITH DIFFERENTIAL/PLATELET
Abs Immature Granulocytes: 0.02 10*3/uL (ref 0.00–0.07)
Basophils Absolute: 0.1 10*3/uL (ref 0.0–0.1)
Basophils Relative: 1 %
Eosinophils Absolute: 0.1 10*3/uL (ref 0.0–0.5)
Eosinophils Relative: 2 %
HCT: 38.3 % (ref 36.0–46.0)
Hemoglobin: 13.6 g/dL (ref 12.0–15.0)
Immature Granulocytes: 0 %
Lymphocytes Relative: 20 %
Lymphs Abs: 1.1 10*3/uL (ref 0.7–4.0)
MCH: 34.9 pg — ABNORMAL HIGH (ref 26.0–34.0)
MCHC: 35.5 g/dL (ref 30.0–36.0)
MCV: 98.2 fL (ref 80.0–100.0)
Monocytes Absolute: 0.5 10*3/uL (ref 0.1–1.0)
Monocytes Relative: 8 %
Neutro Abs: 4 10*3/uL (ref 1.7–7.7)
Neutrophils Relative %: 69 %
Platelets: 153 10*3/uL (ref 150–400)
RBC: 3.9 MIL/uL (ref 3.87–5.11)
RDW: 12.3 % (ref 11.5–15.5)
WBC: 5.8 10*3/uL (ref 4.0–10.5)
nRBC: 0 % (ref 0.0–0.2)

## 2020-12-08 LAB — FERRITIN: Ferritin: 176 ng/mL (ref 11–307)

## 2020-12-08 NOTE — Progress Notes (Signed)
Patient tolerated Phlebotomy procedure without complication. of blood taken. Pt given sprite for po intake. NAD noted . Airway is patent and intact.

## 2020-12-08 NOTE — Progress Notes (Signed)
Per Dr Orlie Dakin we may proceed with therapeutic phlebotomy utilizing last ferritin of 377. Ferritin level was drawn today. Results will not be available for this visit.

## 2020-12-13 ENCOUNTER — Other Ambulatory Visit: Payer: Self-pay | Admitting: Family Medicine

## 2020-12-13 ENCOUNTER — Other Ambulatory Visit: Payer: Self-pay | Admitting: Neurology

## 2020-12-13 DIAGNOSIS — H539 Unspecified visual disturbance: Secondary | ICD-10-CM

## 2020-12-13 NOTE — Telephone Encounter (Signed)
Last office visit 08/03/2020 for BP check and blurred vision.  Last refilled 06/28/2020 for #30 with 2 refills. No future appointments with PCP.

## 2020-12-15 ENCOUNTER — Telehealth: Payer: Self-pay | Admitting: Oncology

## 2020-12-15 NOTE — Telephone Encounter (Addendum)
Pt called requesting lab results from 3/10 to determine if she needs to come for her phlebotomy.  Per Dr. Orlie Dakin, keep appts for April and he will assess and determine then.  Patient requested to reschedule appts to the next week due to scheduling conflicts.

## 2020-12-26 ENCOUNTER — Ambulatory Visit: Payer: 59

## 2021-01-11 ENCOUNTER — Other Ambulatory Visit: Payer: 59

## 2021-01-12 ENCOUNTER — Ambulatory Visit: Payer: 59 | Admitting: Oncology

## 2021-01-15 NOTE — Progress Notes (Signed)
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  INTERVAL HISTORY: Patient returns to clinic today for repeat laboratory work, further evaluation, and consideration of additional phlebotomy.  She currently feels well and is asymptomatic.  Her joint pain is better controlled. She has no neurologic complaints.  She denies any recent fevers or illnesses.  She has a good appetite and denies weight loss.  She has no chest pain, shortness of breath, cough, or hemoptysis.  She has no nausea, vomiting, constipation, or diarrhea.  She has no urinary complaints.  Patient offers no further specific complaints today.    REVIEW OF SYSTEMS:   Review of Systems  Constitutional: Negative.  Negative for fever, malaise/fatigue and weight loss.  Respiratory: Negative.  Negative for cough, hemoptysis and shortness of breath.   Cardiovascular: Negative.  Negative for chest pain and leg swelling.  Gastrointestinal: Negative.  Negative for abdominal pain.  Genitourinary: Negative.  Negative for dysuria.  Musculoskeletal: Positive for joint pain.  Skin: Negative.  Negative for rash.  Neurological: Negative.  Negative for dizziness, focal weakness, weakness and headaches.  Psychiatric/Behavioral: Negative.  The patient is not nervous/anxious.     As per HPI. Otherwise, a complete review of systems is negative.  PAST MEDICAL HISTORY: Past Medical History:  Diagnosis Date  . Arthritis   . Depression, major, single episode, mild (HCC) 12/20/2016  . Generalized anxiety disorder 09/08/2013  . Hypertension    not on medications at this time  . Panic disorder 07/21/2014  . Reactive arthritis  (HCC) 01/27/2013   Followed by Rheumatologist ( Dr. [name]). Now off enbrel, methotrexate, uses indomethacin and hydrocodone rarely for pain.     PAST SURGICAL HISTORY: Past Surgical History:  Procedure Laterality Date  . PERONEAL NERVE DECOMPRESSION Left 12/14/2019   Procedure: PERONEAL NERVE DECOMPRESSION;  Surgeon: [name], MD;  Location: [facility] ORS;  Service: Neurosurgery;  Laterality: Left;  . WISDOM TOOTH EXTRACTION      FAMILY HISTORY: Family History  Problem Relation Age of Onset  . Pulmonary Hypertension Mother   . Cardiomyopathy Mother   . Heart disease Mother   . Heart disease Father 29       MI  . Heart disease Maternal Grandfather   . Heart disease Paternal Grandfather     ADVANCED DIRECTIVES (Y/N):  N  HEALTH MAINTENANCE: Social History   Tobacco Use  . Smoking status: Never Smoker  . Smokeless tobacco: Never Used  Vaping Use  . Vaping Use: Never used  Substance Use Topics  . Alcohol use: Yes    Alcohol/week: 4.0 standard drinks    Types: 4 Standard drinks or equivalent per week    Comment: occ  . Drug use: No     Colonoscopy:  PAP:  Bone density:  Lipid panel:  Allergies  Allergen Reactions  . Amoxicillin     Muscle pain  . Other Other (See Comments)  . Naproxen Sodium Rash    Current Outpatient Medications  Medication Sig Dispense Refill  . diazepam (VALIUM) 5 MG tablet TAKE 1 TABLET(5 MG) BY MOUTH EVERY 8 HOURS AS NEEDED FOR ANXIETY 30 tablet 2  . levonorgestrel (KYLEENA) 19.5 MG IUD Kyleena 17.5 mcg/24 hrs (81yrs) 19.5mg  intrauterine device    .  meloxicam (MOBIC) 7.5 MG tablet Take 7.5 mg by mouth daily.    Marland Kitchen omeprazole (PRILOSEC) 40 MG capsule TAKE 1 CAPSULE(40 MG) BY MOUTH DAILY 90 capsule 3  . sertraline (ZOLOFT) 25 MG tablet Take 1 tablet (25 mg total) by mouth daily. 30 tablet 2  . etodolac (LODINE) 400 MG tablet Take 400 mg by mouth 2 (two) times daily. (Patient not taking: Reported on 01/19/2021)    . Upadacitinib ER (RINVOQ) 15  MG TB24 Take 1 tablet by mouth daily as needed. (Patient not taking: Reported on 01/19/2021)     No current facility-administered medications for this visit.    OBJECTIVE: Vitals:   01/19/21 1406  BP: 114/77  Pulse: 85  Resp: 16  Temp: 98.6 F (37 C)     Body mass index is 29.12 kg/m.    ECOG FS:0 - Asymptomatic  General: Well-developed, well-nourished, no acute distress. Eyes: Pink conjunctiva, anicteric sclera. HEENT: Normocephalic, moist mucous membranes. Lungs: No audible wheezing or coughing. Heart: Regular rate and rhythm. Abdomen: Soft, nontender, no obvious distention. Musculoskeletal: No edema, cyanosis, or clubbing. Neuro: Alert, answering all questions appropriately. Cranial nerves grossly intact. Skin: No rashes or petechiae noted. Psych: Normal affect.   LAB RESULTS:  Lab Results  Component Value Date   NA 138 12/14/2019   K 3.2 (L) 12/14/2019   CL 103 12/14/2019   CO2 26 12/11/2019   GLUCOSE 108 (H) 12/14/2019   BUN 4 (L) 12/14/2019   CREATININE 0.50 12/14/2019   CALCIUM 8.7 (L) 12/11/2019   PROT 7.0 02/13/2018   ALBUMIN 4.2 02/13/2018   AST 26 02/13/2018   ALT 24 02/13/2018   ALKPHOS 48 02/13/2018   BILITOT 0.8 02/13/2018   GFRNONAA >60 12/11/2019   GFRAA >60 12/11/2019    Lab Results  Component Value Date   WBC 6.3 01/18/2021   NEUTROABS 4.4 01/18/2021   HGB 13.9 01/18/2021   HCT 38.7 01/18/2021   MCV 95.6 01/18/2021   PLT 163 01/18/2021   Lab Results  Component Value Date   IRON 162 01/18/2021   TIBC 473 (H) 01/18/2021   IRONPCTSAT 34 (H) 01/18/2021   Lab Results  Component Value Date   FERRITIN 106 01/18/2021     STUDIES: No results found.  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  Goal ferritin is between 50 and 100.  Patient does  not wish to pursue phlebotomy today.  No intervention is needed.  Return to clinic in 3 months with repeat laboratory work, further evaluation, and consideration of 500 mL phlebotomy if needed.   2.  Rheumatoid arthritis: Continue evaluation and treatment per rheumatology.  I spent a total of 20 minutes reviewing chart data, face-to-face evaluation with the patient, counseling and coordination of care as detailed above.   [name], MD 01/22/2021 9:08 AM

## 2021-01-18 ENCOUNTER — Inpatient Hospital Stay: Payer: 59 | Attending: Oncology

## 2021-01-18 ENCOUNTER — Other Ambulatory Visit: Payer: Self-pay

## 2021-01-18 DIAGNOSIS — Z79899 Other long term (current) drug therapy: Secondary | ICD-10-CM | POA: Insufficient documentation

## 2021-01-18 DIAGNOSIS — Z791 Long term (current) use of non-steroidal anti-inflammatories (NSAID): Secondary | ICD-10-CM | POA: Insufficient documentation

## 2021-01-18 DIAGNOSIS — M069 Rheumatoid arthritis, unspecified: Secondary | ICD-10-CM | POA: Diagnosis not present

## 2021-01-18 DIAGNOSIS — I10 Essential (primary) hypertension: Secondary | ICD-10-CM | POA: Insufficient documentation

## 2021-01-18 DIAGNOSIS — F418 Other specified anxiety disorders: Secondary | ICD-10-CM | POA: Insufficient documentation

## 2021-01-18 DIAGNOSIS — Z148 Genetic carrier of other disease: Secondary | ICD-10-CM

## 2021-01-18 LAB — CBC WITH DIFFERENTIAL/PLATELET
Abs Immature Granulocytes: 0.02 10*3/uL (ref 0.00–0.07)
Basophils Absolute: 0 10*3/uL (ref 0.0–0.1)
Basophils Relative: 1 %
Eosinophils Absolute: 0.1 10*3/uL (ref 0.0–0.5)
Eosinophils Relative: 2 %
HCT: 38.7 % (ref 36.0–46.0)
Hemoglobin: 13.9 g/dL (ref 12.0–15.0)
Immature Granulocytes: 0 %
Lymphocytes Relative: 19 %
Lymphs Abs: 1.2 10*3/uL (ref 0.7–4.0)
MCH: 34.3 pg — ABNORMAL HIGH (ref 26.0–34.0)
MCHC: 35.9 g/dL (ref 30.0–36.0)
MCV: 95.6 fL (ref 80.0–100.0)
Monocytes Absolute: 0.5 10*3/uL (ref 0.1–1.0)
Monocytes Relative: 8 %
Neutro Abs: 4.4 10*3/uL (ref 1.7–7.7)
Neutrophils Relative %: 70 %
Platelets: 163 10*3/uL (ref 150–400)
RBC: 4.05 MIL/uL (ref 3.87–5.11)
RDW: 11.9 % (ref 11.5–15.5)
WBC: 6.3 10*3/uL (ref 4.0–10.5)
nRBC: 0 % (ref 0.0–0.2)

## 2021-01-18 LAB — IRON AND TIBC
Iron: 162 ug/dL (ref 28–170)
Saturation Ratios: 34 % — ABNORMAL HIGH (ref 10.4–31.8)
TIBC: 473 ug/dL — ABNORMAL HIGH (ref 250–450)
UIBC: 311 ug/dL

## 2021-01-18 LAB — FERRITIN: Ferritin: 106 ng/mL (ref 11–307)

## 2021-01-19 ENCOUNTER — Inpatient Hospital Stay: Payer: 59

## 2021-01-19 ENCOUNTER — Inpatient Hospital Stay (HOSPITAL_BASED_OUTPATIENT_CLINIC_OR_DEPARTMENT_OTHER): Payer: 59 | Admitting: Oncology

## 2021-01-19 ENCOUNTER — Encounter: Payer: Self-pay | Admitting: Oncology

## 2021-01-19 VITALS — BP 114/77 | HR 85 | Temp 98.6°F | Resp 16 | Wt 175.0 lb

## 2021-01-19 DIAGNOSIS — Z148 Genetic carrier of other disease: Secondary | ICD-10-CM | POA: Diagnosis not present

## 2021-01-19 NOTE — Progress Notes (Signed)
Pt in for follow up, denies any concerns today. 

## 2021-01-30 ENCOUNTER — Telehealth: Payer: Self-pay

## 2021-01-30 NOTE — Telephone Encounter (Signed)
Pt said on and off has episodes with blurred vision,pt gets hot and sweaty, sometimes pt has h/a and lightheadedness afterwards. Pt last episode was earlier today that only lasted few minutes (less than 5 mins). Pt last BP taken was in Dr Milinda Cave office on 01/19/21 and BP was 114/77 pulse was 85. Pt said she thinks she might should have some fasting labs done and prefers to see someone on 01/31/21. Pt scheduled appt with Dr Reece Agar on 01/31/21 at 4:00 and pt will eat early lunch and fast 4 hrs prior to coming for appt on 01/31/21. UC & ED precautions given and pt voiced understanding. Sending note to Dr Reece Agar.

## 2021-01-30 NOTE — Telephone Encounter (Signed)
Noted  

## 2021-01-31 ENCOUNTER — Encounter: Payer: Self-pay | Admitting: Family Medicine

## 2021-01-31 ENCOUNTER — Other Ambulatory Visit: Payer: Self-pay

## 2021-01-31 ENCOUNTER — Ambulatory Visit (INDEPENDENT_AMBULATORY_CARE_PROVIDER_SITE_OTHER): Payer: 59 | Admitting: Family Medicine

## 2021-01-31 VITALS — BP 120/82 | HR 76 | Temp 97.6°F | Ht 65.0 in | Wt 174.1 lb

## 2021-01-31 DIAGNOSIS — E538 Deficiency of other specified B group vitamins: Secondary | ICD-10-CM | POA: Diagnosis not present

## 2021-01-31 DIAGNOSIS — Z148 Genetic carrier of other disease: Secondary | ICD-10-CM

## 2021-01-31 DIAGNOSIS — R739 Hyperglycemia, unspecified: Secondary | ICD-10-CM

## 2021-01-31 DIAGNOSIS — M0230 Reiter's disease, unspecified site: Secondary | ICD-10-CM

## 2021-01-31 DIAGNOSIS — R42 Dizziness and giddiness: Secondary | ICD-10-CM

## 2021-01-31 NOTE — Patient Instructions (Addendum)
Carry snacks with you, try to eat or drink something next time this happens. Let us know how you do.  Labs today.  Schedule physical with Dr Patsy Lager at your convienence.

## 2021-01-31 NOTE — Progress Notes (Signed)
Patient ID: Phyllis Myers, female    DOB: 06-10-1981, 40 y.o.   MRN: [id number]  This visit was conducted in person.  BP 120/82   Pulse 76   Temp 97.6 F (36.4 C) (Temporal)   Ht 5\' 5"  (1.651 m)   Wt 174 lb 2 oz (79 kg)   SpO2 99%   BMI 28.98 kg/m   BP Readings from Last 3 Encounters:  01/31/21 120/82  01/19/21 114/77  12/08/20 107/85   CC: blurred vision  Subjective:   HPI: Phyllis Myers is a 40 y.o. female presenting on 01/31/2021 for Blurred Vision (C/o episode of blurred vision.  Has had for few yrs and seen previously.  Concerned now it may be BS related. )   Off and on blurred vision for years, associated with dizziness and panicked feeling. Describes feeling hot, sweaty hands/diaphoresis, blurred vision. Dizziness not orthostatic. Last eye exam was a few months ago - normal exam.   Last saw PCP Dr Patsy Lager 08/2020 for similar concerns, referred to cardiology - carotid US and zio patch reassuring (Arida).   Had another episode yesterday.  Takes b12 daily but forgets. Better with daily vitamin D 5000 IU daily.   H/o rheumatoid arthritis/reactive arthritis followed by rheumatologist (Dr [name]) previously on Rinvoq - currently off this.  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  Latest saw Dr Sherryll Burger neurology 11/2020 ?migraine aura without headache as possible cause of vision changes.   Normal MRI and w and wo last year.  Normal stress and echo recently.      Relevant past medical, surgical, family and social history reviewed and updated as indicated. Interim medical history since our last visit reviewed. Allergies and medications reviewed and updated. Outpatient Medications Prior to Visit  Medication Sig Dispense Refill  . diazepam (VALIUM) 5 MG tablet TAKE 1 TABLET(5 MG) BY MOUTH EVERY 8 HOURS AS NEEDED FOR ANXIETY 30 tablet 2  .  levonorgestrel (KYLEENA) 19.5 MG IUD Kyleena 17.5 mcg/24 hrs (59yrs) 19.5mg  intrauterine device    . meloxicam (MOBIC) 7.5 MG tablet Take 7.5 mg by mouth daily.    Marland Kitchen omeprazole (PRILOSEC) 40 MG capsule TAKE 1 CAPSULE(40 MG) BY MOUTH DAILY 90 capsule 3  . sertraline (ZOLOFT) 25 MG tablet Take 1 tablet (25 mg total) by mouth daily. 30 tablet 2  . etodolac (LODINE) 400 MG tablet Take 400 mg by mouth 2 (two) times daily. (Patient not taking: Reported on 01/19/2021)    . Upadacitinib ER (RINVOQ) 15 MG TB24 Take 1 tablet by mouth daily as needed. (Patient not taking: Reported on 01/19/2021)     No facility-administered medications prior to visit.     Per HPI unless specifically indicated in ROS section below Review of Systems Objective:  BP 120/82   Pulse 76   Temp 97.6 F (36.4 C) (Temporal)   Ht 5\' 5"  (1.651 m)   Wt 174 lb 2 oz (79 kg)   SpO2 99%   BMI 28.98 kg/m   Wt Readings from Last 3 Encounters:  01/31/21 174 lb 2 oz (79 kg)  01/19/21 175 lb (79.4 kg)  09/12/20 177 lb (80.3 kg)      Physical Exam Vitals and nursing note reviewed.  Constitutional:      Appearance: Normal appearance. She is not ill-appearing.  Eyes:     Extraocular Movements: Extraocular movements intact.  Conjunctiva/sclera: Conjunctivae normal.     Pupils: Pupils are equal, round, and reactive to light.  Cardiovascular:     Rate and Rhythm: Normal rate and regular rhythm.     Pulses: Normal pulses.     Heart sounds: Normal heart sounds. No murmur heard.   Pulmonary:     Effort: Pulmonary effort is normal. No respiratory distress.     Breath sounds: Normal breath sounds. No wheezing, rhonchi or rales.  Musculoskeletal:     Cervical back: Normal range of motion and neck supple. No rigidity.     Right lower leg: No edema.     Left lower leg: No edema.  Lymphadenopathy:     Cervical: No cervical adenopathy.  Skin:    General: Skin is warm and dry.     Findings: No rash.  Neurological:     Mental  Status: She is alert.  Psychiatric:        Mood and Affect: Mood normal.        Behavior: Behavior normal.       Results for orders placed or performed in visit on 01/18/21  Iron and TIBC  Result Value Ref Range   Iron 162 28 - 170 ug/dL   TIBC 696 (H) 295 - 284 ug/dL   Saturation Ratios 34 (H) 10.4 - 31.8 %   UIBC 311 ug/dL  Ferritin  Result Value Ref Range   Ferritin 106 11 - 307 ng/mL  CBC with Differential/Platelet  Result Value Ref Range   WBC 6.3 4.0 - 10.5 K/uL   RBC 4.05 3.87 - 5.11 MIL/uL   Hemoglobin 13.9 12.0 - 15.0 g/dL   HCT 13.2 44.0 - 10.2 %   MCV 95.6 80.0 - 100.0 fL   MCH 34.3 (H) 26.0 - 34.0 pg   MCHC 35.9 30.0 - 36.0 g/dL   RDW 72.5 36.6 - 44.0 %   Platelets 163 150 - 400 K/uL   nRBC 0.0 0.0 - 0.2 %   Neutrophils Relative % 70 %   Neutro Abs 4.4 1.7 - 7.7 K/uL   Lymphocytes Relative 19 %   Lymphs Abs 1.2 0.7 - 4.0 K/uL   Monocytes Relative 8 %   Monocytes Absolute 0.5 0.1 - 1.0 K/uL   Eosinophils Relative 2 %   Eosinophils Absolute 0.1 0.0 - 0.5 K/uL   Basophils Relative 1 %   Basophils Absolute 0.0 0.0 - 0.1 K/uL   Immature Granulocytes 0 %   Abs Immature Granulocytes 0.02 0.00 - 0.07 K/uL   Lab Results  Component Value Date   TSH 1.71 08/03/2020    Lab Results  Component Value Date   CHOL 173 02/13/2018   HDL 55.20 02/13/2018   LDLCALC 80 02/13/2018   LDLDIRECT 150.1 06/08/2013   TRIG 191.0 (H) 02/13/2018   CHOLHDL 3 02/13/2018    Lab Results  Component Value Date   CREATININE 0.50 12/14/2019   BUN 4 (L) 12/14/2019   NA 138 12/14/2019   K 3.2 (L) 12/14/2019   CL 103 12/14/2019   CO2 26 12/11/2019    Lab Results  Component Value Date   ALT 24 02/13/2018   AST 26 02/13/2018   ALKPHOS 48 02/13/2018   BILITOT 0.8 02/13/2018   Assessment & Plan:  This visit occurred during the SARS-CoV-2 public health emergency.  Safety protocols were in place, including screening questions prior to the visit, additional usage of staff PPE, and  extensive cleaning of exam room while observing appropriate contact time as  indicated for disinfecting solutions.   Problem List Items Addressed This Visit    Reactive arthritis (HCC)    Gets routine labs through rheum - thinks liver recently checked. No records of this - will see if we can add LFTs.       Dizziness - Primary    Dizziness described as presyncopal associated with blurry vision and diaphoresis. Need to exclude hypoglycemia, will check routine labwork today to further eval for reversible causes. Discussed keeping snacks with her to use next episode to see if beneficial.  In h/o low b12 will update levels off replacement.       Relevant Orders   Vitamin B12   VITAMIN D 25 Hydroxy (Vit-D Deficiency, Fractures)   Basic metabolic panel   Hemochromatosis carrier    Heterozygous for hemochromatosis with iron overload. Sees heme s/p routine blood draws, recent testing now in normal range. Will further evaluate for diabetes       Other Visit Diagnoses    Hyperglycemia       Relevant Orders   Basic metabolic panel   Hemoglobin A1c   Low serum vitamin B12       Relevant Orders   Vitamin B12       No orders of the defined types were placed in this encounter.  Orders Placed This Encounter  Procedures  . Vitamin B12  . VITAMIN D 25 Hydroxy (Vit-D Deficiency, Fractures)  . Basic metabolic panel  . Hemoglobin A1c    Patient Instructions  Carry snacks with you, try to eat or drink something next time this happens. Let us know how you do.  Labs today.  Schedule physical with Dr Patsy Lager at your convienence.   Follow up plan: Return if symptoms worsen or fail to improve.  Eustaquio Boyden, MD

## 2021-02-01 ENCOUNTER — Other Ambulatory Visit (INDEPENDENT_AMBULATORY_CARE_PROVIDER_SITE_OTHER): Payer: 59

## 2021-02-01 DIAGNOSIS — Z148 Genetic carrier of other disease: Secondary | ICD-10-CM | POA: Diagnosis not present

## 2021-02-01 LAB — BASIC METABOLIC PANEL
BUN: 10 mg/dL (ref 6–23)
CO2: 26 mEq/L (ref 19–32)
Calcium: 9.4 mg/dL (ref 8.4–10.5)
Chloride: 97 mEq/L (ref 96–112)
Creatinine, Ser: 0.58 mg/dL (ref 0.40–1.20)
GFR: 112.3 mL/min (ref >60.00)
Glucose, Bld: 84 mg/dL (ref 70–99)
Potassium: 4.5 mEq/L (ref 3.5–5.1)
Sodium: 135 mEq/L (ref 135–145)

## 2021-02-01 LAB — HEPATIC FUNCTION PANEL
ALT: 55 U/L — ABNORMAL HIGH (ref 0–35)
AST: 68 U/L — ABNORMAL HIGH (ref 0–37)
Albumin: 4.6 g/dL (ref 3.5–5.2)
Alkaline Phosphatase: 71 U/L (ref 39–117)
Bilirubin, Direct: 0.3 mg/dL (ref 0.0–0.3)
Total Bilirubin: 1.5 mg/dL — ABNORMAL HIGH (ref 0.2–1.2)
Total Protein: 7.4 g/dL (ref 6.0–8.3)

## 2021-02-01 LAB — HEMOGLOBIN A1C: Hgb A1c MFr Bld: 4.6 % (ref 4.6–6.5)

## 2021-02-01 LAB — VITAMIN B12: Vitamin B-12: 432 pg/mL (ref 211–911)

## 2021-02-01 LAB — VITAMIN D 25 HYDROXY (VIT D DEFICIENCY, FRACTURES): VITD: 58.75 ng/mL (ref 30.00–100.00)

## 2021-02-01 NOTE — Assessment & Plan Note (Addendum)
Dizziness described as presyncopal associated with blurry vision and diaphoresis. Need to exclude hypoglycemia, will check routine labwork today to further eval for reversible causes. Discussed keeping snacks with her to use next episode to see if beneficial.  In h/o low b12 will update levels off replacement.

## 2021-02-01 NOTE — Assessment & Plan Note (Addendum)
Gets routine labs through rheum - thinks liver recently checked. No records of this - will see if we can add LFTs.

## 2021-02-01 NOTE — Assessment & Plan Note (Addendum)
Heterozygous for hemochromatosis with iron overload. Sees heme s/p routine blood draws, recent testing now in normal range. Will further evaluate for diabetes

## 2021-02-01 NOTE — Assessment & Plan Note (Deleted)
BP stable off medication - this seems to have resolved since off megace.

## 2021-04-24 ENCOUNTER — Other Ambulatory Visit: Payer: Self-pay

## 2021-04-24 ENCOUNTER — Inpatient Hospital Stay: Payer: 59 | Attending: Oncology

## 2021-04-24 DIAGNOSIS — M069 Rheumatoid arthritis, unspecified: Secondary | ICD-10-CM | POA: Diagnosis not present

## 2021-04-24 DIAGNOSIS — R748 Abnormal levels of other serum enzymes: Secondary | ICD-10-CM | POA: Diagnosis not present

## 2021-04-24 DIAGNOSIS — Z148 Genetic carrier of other disease: Secondary | ICD-10-CM

## 2021-04-24 LAB — CBC WITH DIFFERENTIAL/PLATELET
Abs Immature Granulocytes: 0.01 10*3/uL (ref 0.00–0.07)
Basophils Absolute: 0 10*3/uL (ref 0.0–0.1)
Basophils Relative: 1 %
Eosinophils Absolute: 0.2 10*3/uL (ref 0.0–0.5)
Eosinophils Relative: 2 %
HCT: 40.1 % (ref 36.0–46.0)
Hemoglobin: 14.2 g/dL (ref 12.0–15.0)
Immature Granulocytes: 0 %
Lymphocytes Relative: 14 %
Lymphs Abs: 0.9 10*3/uL (ref 0.7–4.0)
MCH: 35.3 pg — ABNORMAL HIGH (ref 26.0–34.0)
MCHC: 35.4 g/dL (ref 30.0–36.0)
MCV: 99.8 fL (ref 80.0–100.0)
Monocytes Absolute: 0.6 10*3/uL (ref 0.1–1.0)
Monocytes Relative: 8 %
Neutro Abs: 4.9 10*3/uL (ref 1.7–7.7)
Neutrophils Relative %: 75 %
Platelets: 147 10*3/uL — ABNORMAL LOW (ref 150–400)
RBC: 4.02 MIL/uL (ref 3.87–5.11)
RDW: 11.9 % (ref 11.5–15.5)
WBC: 6.6 10*3/uL (ref 4.0–10.5)
nRBC: 0 % (ref 0.0–0.2)

## 2021-04-24 LAB — IRON AND TIBC
Iron: 180 ug/dL — ABNORMAL HIGH (ref 28–170)
Saturation Ratios: 41 % — ABNORMAL HIGH (ref 10.4–31.8)
TIBC: 441 ug/dL (ref 250–450)
UIBC: 261 ug/dL

## 2021-04-24 LAB — FERRITIN: Ferritin: 93 ng/mL (ref 11–307)

## 2021-04-24 NOTE — Progress Notes (Signed)
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  INTERVAL HISTORY: Patient returns to clinic today for repeat laboratory work, further evaluation, and consideration of additional phlebotomy.  She continues to feel well and remains asymptomatic.  She continues to have joint pain secondary to her underlying rheumatoid arthritis. She has no neurologic complaints.  She denies any recent fevers or illnesses.  She has a good appetite and denies weight loss.  She has no chest pain, shortness of breath, cough, or hemoptysis.  She has no nausea, vomiting, constipation, or diarrhea.  She has no urinary complaints.  Patient offers no further specific complaints today.  REVIEW OF SYSTEMS:   Review of Systems  Constitutional: Negative.  Negative for fever, malaise/fatigue and weight loss.  Respiratory: Negative.  Negative for cough, hemoptysis and shortness of breath.   Cardiovascular: Negative.  Negative for chest pain and leg swelling.  Gastrointestinal: Negative.  Negative for abdominal pain.  Genitourinary: Negative.  Negative for dysuria.  Musculoskeletal:  Positive for joint pain.  Skin: Negative.  Negative for rash.  Neurological: Negative.  Negative for dizziness, focal weakness, weakness and headaches.  Psychiatric/Behavioral: Negative.  The patient is not nervous/anxious.    As per HPI. Otherwise, a complete review of systems is negative.  PAST MEDICAL HISTORY: Past Medical History:  Diagnosis Date   Arthritis    Depression, major, single episode, mild (HCC) 12/20/2016   Generalized anxiety disorder 09/08/2013   Hypertension    not on medications at this time   Panic  disorder 07/21/2014   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   Followed by Rheumatologist ( Dr. [name]). Now off enbrel, methotrexate, uses indomethacin and hydrocodone rarely for pain.     PAST SURGICAL HISTORY: Past Surgical History:  Procedure Laterality Date   PERONEAL NERVE DECOMPRESSION Left 12/14/2019   Procedure: PERONEAL NERVE DECOMPRESSION;  Surgeon: [name], MD;  Location: [facility] ORS;  Service: Neurosurgery;  Laterality: Left;   WISDOM TOOTH EXTRACTION      FAMILY HISTORY: Family History  Problem Relation Age of Onset   Pulmonary Hypertension Mother    Cardiomyopathy Mother    Heart disease Mother    Heart disease Father 53       MI   Heart disease Maternal Grandfather    Heart disease Paternal Grandfather     ADVANCED DIRECTIVES (Y/N):  N  HEALTH MAINTENANCE: Social History   Tobacco Use   Smoking status: Never   Smokeless tobacco: Never  Vaping Use   Vaping Use: Never used  Substance Use Topics   Alcohol use: Yes    Alcohol/week: 4.0 standard drinks    Types: 4 Standard drinks or equivalent per week    Comment: occ   Drug use: No     Colonoscopy:  PAP:  Bone density:  Lipid panel:  Allergies  Allergen Reactions   Amoxicillin     Muscle pain   Other Other (See Comments)   Naproxen Sodium Rash    Current Outpatient Medications  Medication Sig Dispense Refill   diazepam (VALIUM) 5 MG tablet TAKE 1 TABLET(5 MG) BY MOUTH EVERY 8 HOURS AS NEEDED FOR ANXIETY 30 tablet 2   levonorgestrel (KYLEENA) 19.5 MG IUD Kyleena 17.5 mcg/24 hrs (76yrs) 19.5mg  intrauterine device  meloxicam (MOBIC) 7.5 MG tablet Take 7.5 mg by mouth daily.     omeprazole (PRILOSEC) 40 MG capsule TAKE 1 CAPSULE(40 MG) BY MOUTH DAILY 90 capsule 3   sertraline (ZOLOFT) 25 MG tablet Take 1 tablet (25 mg total) by mouth daily. 30 tablet 2   predniSONE (DELTASONE) 5 MG tablet 6 pills x1day, 5x1,4x1,3x1,2x1,1x1 (Patient not taking: Reported on 04/25/2021)     No current  facility-administered medications for this visit.    OBJECTIVE: Vitals:   04/25/21 1410  BP: 123/84  Pulse: 82  Resp: 18  SpO2: 100%     Body mass index is 28.79 kg/m.    ECOG FS:0 - Asymptomatic  General: Well-developed, well-nourished, no acute distress. Eyes: Pink conjunctiva, anicteric sclera. HEENT: Normocephalic, moist mucous membranes. Lungs: No audible wheezing or coughing. Heart: Regular rate and rhythm. Abdomen: Soft, nontender, no obvious distention. Musculoskeletal: No edema, cyanosis, or clubbing. Neuro: Alert, answering all questions appropriately. Cranial nerves grossly intact. Skin: No rashes or petechiae noted. Psych: Normal affect.  LAB RESULTS:  Lab Results  Component Value Date   NA 135 01/31/2021   K 4.5 01/31/2021   CL 97 01/31/2021   CO2 26 01/31/2021   GLUCOSE 84 01/31/2021   BUN 10 01/31/2021   CREATININE 0.58 01/31/2021   CALCIUM 9.4 01/31/2021   PROT 7.4 02/01/2021   ALBUMIN 4.6 02/01/2021   AST 68 (H) 02/01/2021   ALT 55 (H) 02/01/2021   ALKPHOS 71 02/01/2021   BILITOT 1.5 (H) 02/01/2021   GFRNONAA >60 12/11/2019   GFRAA >60 12/11/2019    Lab Results  Component Value Date   WBC 6.6 04/24/2021   NEUTROABS 4.9 04/24/2021   HGB 14.2 04/24/2021   HCT 40.1 04/24/2021   MCV 99.8 04/24/2021   PLT 147 (L) 04/24/2021   Lab Results  Component Value Date   IRON 180 (H) 04/24/2021   TIBC 441 04/24/2021   IRONPCTSAT 41 (H) 04/24/2021   Lab Results  Component Value Date   FERRITIN 93 04/24/2021     STUDIES: No results found.  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 Goal ferritin is between 50 and 100.  She does not require additional phlebotomy today.  Return to clinic in 6 months with repeat laboratory work, further evaluation, and consideration of 500 mL phlebotomy if  needed. 2.  Rheumatoid arthritis: Chronic and unchanged.  Continue evaluation and treatment per rheumatology. 3.  Elevated liver enzymes: Mild, monitor.   [name], MD 04/26/2021 3:52 PM

## 2021-04-25 ENCOUNTER — Inpatient Hospital Stay: Payer: 59

## 2021-04-25 ENCOUNTER — Inpatient Hospital Stay (HOSPITAL_BASED_OUTPATIENT_CLINIC_OR_DEPARTMENT_OTHER): Payer: 59 | Admitting: Oncology

## 2021-04-25 VITALS — BP 123/84 | HR 82 | Resp 18 | Wt 173.0 lb

## 2021-04-25 DIAGNOSIS — Z148 Genetic carrier of other disease: Secondary | ICD-10-CM

## 2021-04-26 ENCOUNTER — Encounter: Payer: Self-pay | Admitting: Oncology

## 2021-04-27 ENCOUNTER — Other Ambulatory Visit: Payer: Self-pay | Admitting: Rheumatology

## 2021-04-27 ENCOUNTER — Other Ambulatory Visit (HOSPITAL_COMMUNITY): Payer: Self-pay | Admitting: Rheumatology

## 2021-04-27 DIAGNOSIS — R7989 Other specified abnormal findings of blood chemistry: Secondary | ICD-10-CM

## 2021-05-02 ENCOUNTER — Ambulatory Visit: Payer: 59

## 2021-05-05 ENCOUNTER — Other Ambulatory Visit: Payer: Self-pay | Admitting: Neurosurgery

## 2021-05-05 ENCOUNTER — Other Ambulatory Visit (HOSPITAL_COMMUNITY): Payer: Self-pay | Admitting: Neurosurgery

## 2021-05-05 DIAGNOSIS — M4310 Spondylolisthesis, site unspecified: Secondary | ICD-10-CM

## 2021-05-09 ENCOUNTER — Ambulatory Visit
Admission: RE | Admit: 2021-05-09 | Discharge: 2021-05-09 | Disposition: A | Discharge status: Home / Self Care | Payer: 59 | Source: Ambulatory Visit | Attending: Rheumatology | Admitting: Rheumatology

## 2021-05-09 ENCOUNTER — Other Ambulatory Visit: Payer: Self-pay

## 2021-05-09 DIAGNOSIS — R7989 Other specified abnormal findings of blood chemistry: Secondary | ICD-10-CM | POA: Insufficient documentation

## 2021-06-11 IMAGING — MR MR LUMBAR SPINE W/O CM
5 series · 31 of 48 positions shown · non-contrast
Comparison: None available.

CLINICAL DATA: Initial evaluation for chronic low back pain with
new onset bilateral foot pain over last 6 months.

EXAM:
MRI LUMBAR SPINE WITHOUT CONTRAST
TECHNIQUE: Multiplanar, multisequence MR imaging of the lumbar spine was
performed. No intravenous contrast was administered.

[Series 5: T2 · sagittal · 4.0mm · 0.81mm/px · 7 of 15 slices shown (1 of 2)]
[im 1/15]
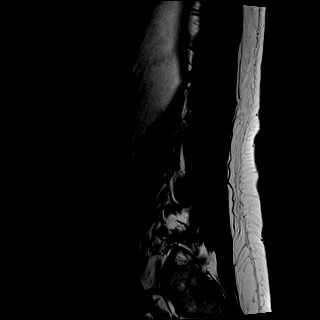
[im 3/15]
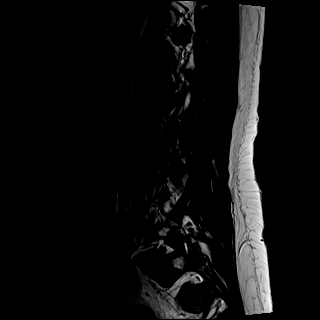
[im 5/15]
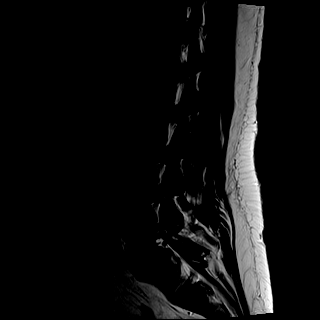
[im 8/15]
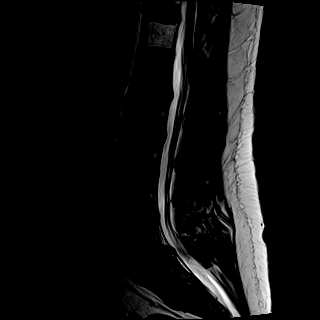
[im 10/15]
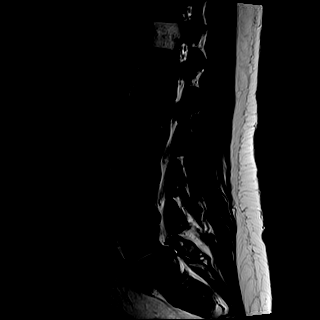
[im 12/15]
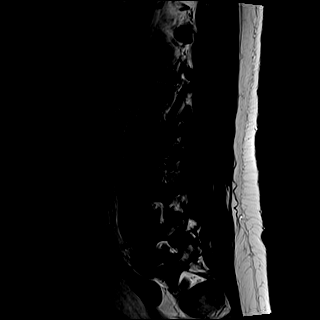
[im 15/15]
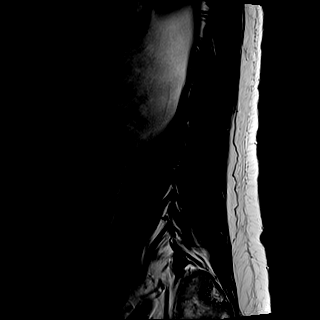

[Series 6: T1 · sagittal · 4.0mm · 0.81mm/px · 7 of 15 slices shown (1 of 2)]
[im 1/15]
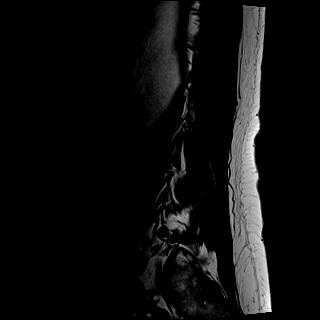
[im 3/15]
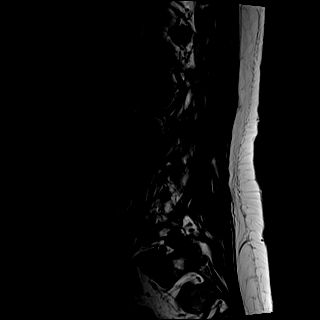
[im 5/15]
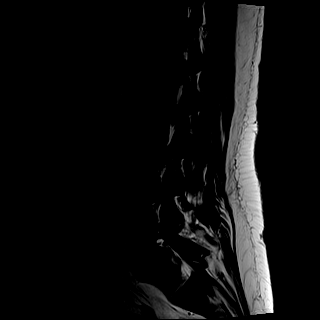
[im 8/15]
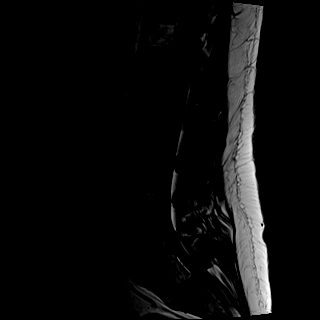
[im 10/15]
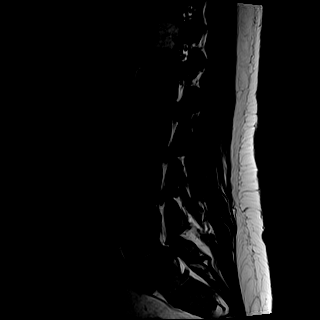
[im 12/15]
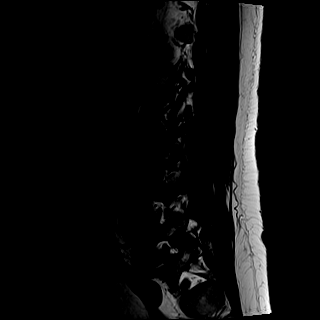
[im 15/15]
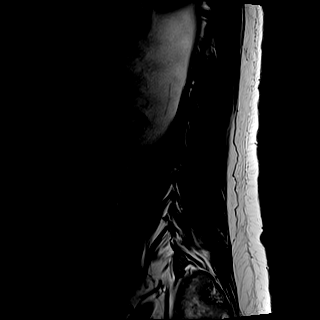

[Series 7: STIR · sagittal · 4.0mm · 0.41mm/px · 1 of 15 slices shown]
[im 1/15]
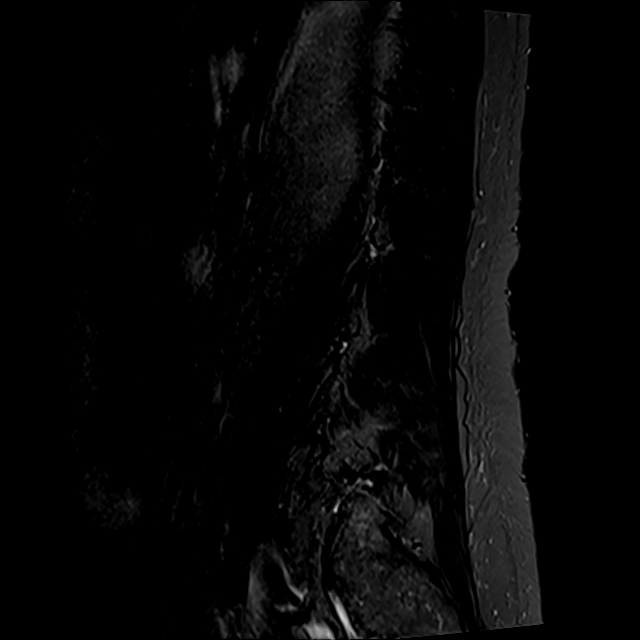

[Series 8: T2 · axial · 4.0mm · 0.78mm/px · z∈[-149,+59]mm · 8 of 34 slices shown (2 of 2)]
[im 1/34]
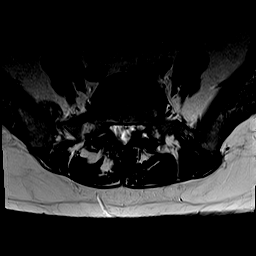
[im 6/34]
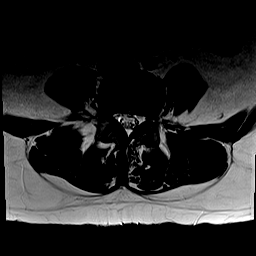
[im 11/34]
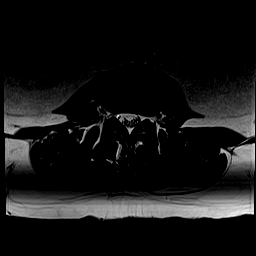
[im 16/34]
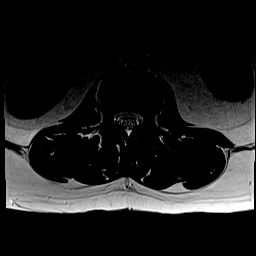
[im 18/34]
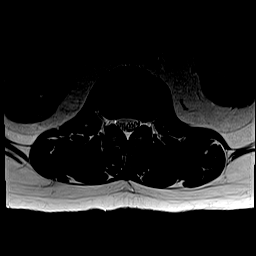
[im 23/34]
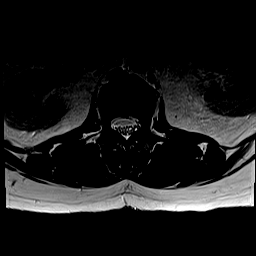
[im 28/34]
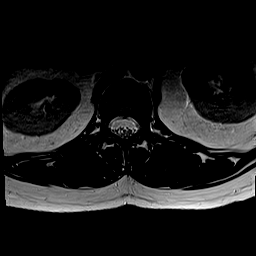
[im 34/34]
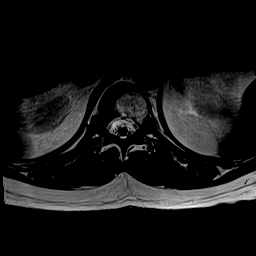

[Series 9: T1 · axial · 4.0mm · 0.39mm/px · z∈[-149,+59]mm · 8 of 34 slices shown (2 of 2)]
[im 1/34]
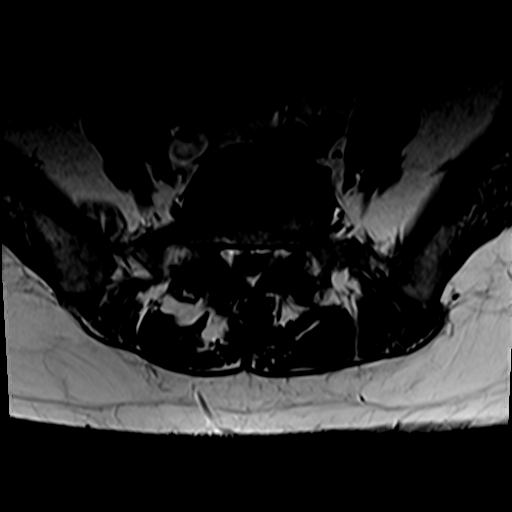
[im 6/34]
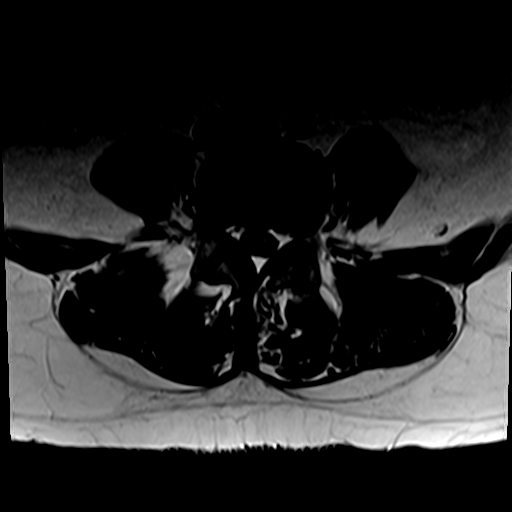
[im 11/34]
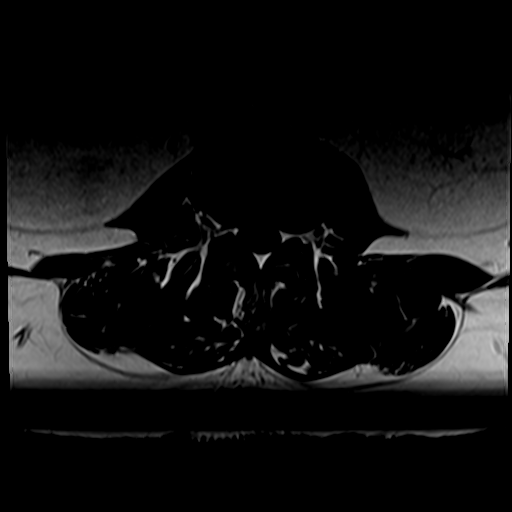
[im 16/34]
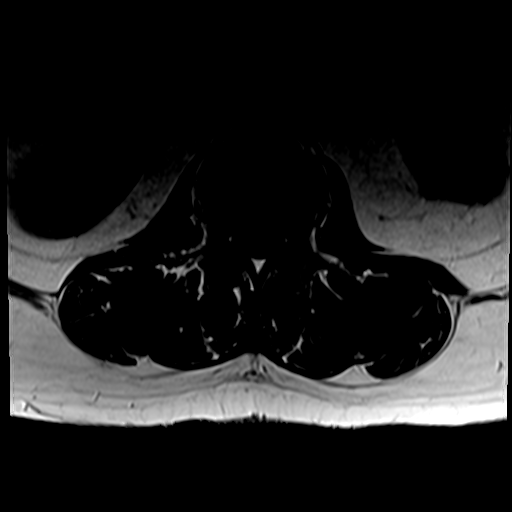
[im 18/34]
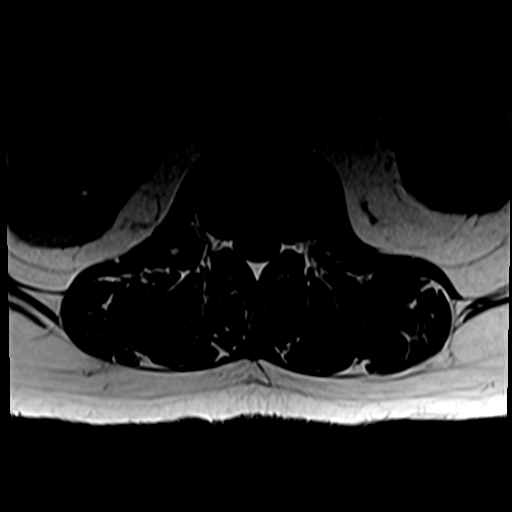
[im 23/34]
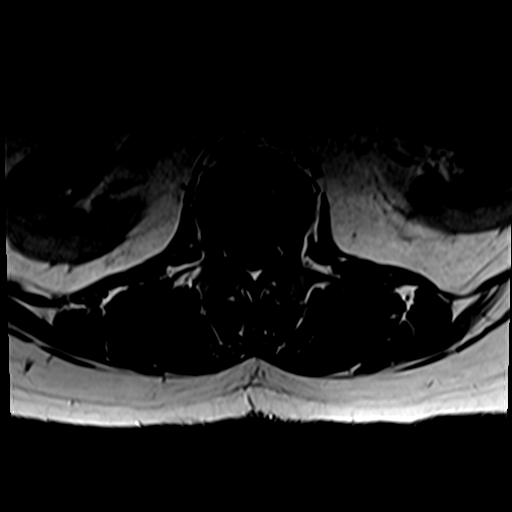
[im 28/34]
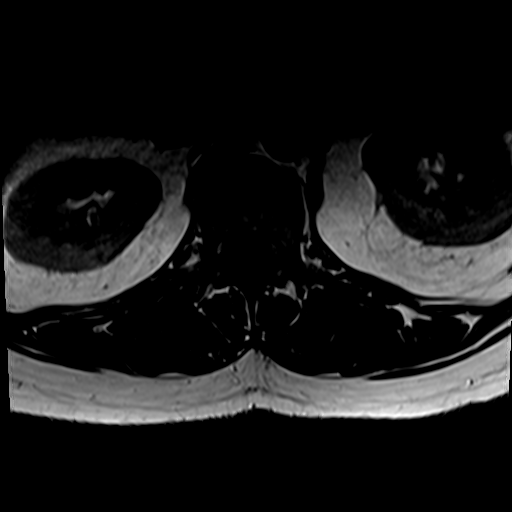
[im 34/34]
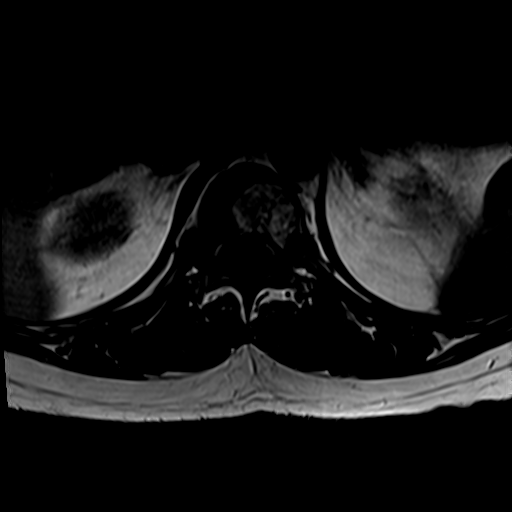

[31 of 48 positions shown; findings below may reference images not displayed]

FINDINGS: Segmentation: Standard. Lowest well-formed disc space labeled the
L5-S1 level.

Alignment: 5 mm anterolisthesis of L5 on S1, chronic and facet
mediated. Alignment otherwise normal with preservation of the normal
lumbar lordosis.

Vertebrae: Vertebral body height maintained without evidence for
acute or chronic fracture. Bone marrow signal intensity within
normal limits. Prominent 2.6 cm benign hemangioma noted within the
left aspect of the T12 vertebral body. Additional small benign
hemangioma noted within the right pedicle of L5. No other discrete
or worrisome osseous lesions. Mild reactive edema about the left
greater than right L5-S1 facets due to facet arthritis. No other
abnormal marrow edema.

Conus medullaris and cauda equina: Conus extends to the T12-L1
level. Conus and cauda equina appear normal.

Paraspinal and other soft tissues: Paraspinous soft tissues within
normal limits. Visualized visceral structures within normal limits.

Disc levels:

T11-12: Unremarkable.

T12-L1: Unremarkable.

L1-2:  Mild annular disc bulge.  No canal or foraminal stenosis.

L2-3: Mild diffuse disc bulge with disc desiccation. No significant
canal or foraminal stenosis.

L3-4: Mild annular disc bulge with disc desiccation. No significant
canal or foraminal stenosis.

L4-5: Mild diffuse disc bulge with disc desiccation. Mild facet
hypertrophy. No canal or neural foraminal stenosis. No impingement.

L5-S1: 5 mm anterolisthesis. Associated broad posterior pseudo disc
bulge/uncovering. Severe bilateral facet arthrosis with associated
small joint effusions. Resultant moderate canal with bilateral
subarticular stenosis, with mild to moderate left greater than right
L5 foraminal narrowing.
IMPRESSION: 1. 5 mm anterolisthesis of L5 on S1 with associated disc bulge and
severe bilateral facet arthrosis, resulting in moderate canal with
bilateral subarticular stenosis, with mild to moderate left greater
than right L5 foraminal narrowing.
2. Mild noncompressive disc bulging elsewhere within the lumbar
spine without significant stenosis or impingement.

## 2021-06-13 ENCOUNTER — Telehealth: Payer: Self-pay | Admitting: Family Medicine

## 2021-06-13 NOTE — Telephone Encounter (Signed)
LMTCB to schedule.

## 2021-06-13 NOTE — Telephone Encounter (Signed)
Pt need refill on diazepam (VALIUM) 5 MG tabletdiazepam (VALIUM) 5 MG tablet

## 2021-06-13 NOTE — Telephone Encounter (Signed)
Last office visit 01/31/2021 with Dr. Reece Agar for dizziness.  Last refilled 12/14/2020 for #30 with 2 refills.  No future appointments with PCP.  Please schedule CPE with Dr. Patsy Lager.

## 2021-06-14 ENCOUNTER — Encounter: Payer: Self-pay | Admitting: Family Medicine

## 2021-06-14 MED ORDER — DIAZEPAM 5 MG PO TABS: ORAL_TABLET | ORAL | 3 refills | Status: DC

## 2021-06-14 NOTE — Telephone Encounter (Signed)
2nd attemp   Left message for pt to call office and schedule appt My chart letter also sent

## 2021-07-19 ENCOUNTER — Other Ambulatory Visit: Payer: Self-pay

## 2021-07-19 ENCOUNTER — Ambulatory Visit
Admission: RE | Admit: 2021-07-19 | Discharge: 2021-07-19 | Disposition: A | Discharge status: Home / Self Care | Payer: 59 | Source: Ambulatory Visit | Attending: Neurosurgery | Admitting: Neurosurgery

## 2021-07-19 DIAGNOSIS — M4310 Spondylolisthesis, site unspecified: Secondary | ICD-10-CM | POA: Diagnosis present

## 2021-07-24 ENCOUNTER — Other Ambulatory Visit: Payer: Self-pay | Admitting: Family Medicine

## 2021-08-04 ENCOUNTER — Other Ambulatory Visit: Payer: Self-pay | Admitting: Neurosurgery

## 2021-08-23 ENCOUNTER — Other Ambulatory Visit: Payer: Self-pay

## 2021-08-23 ENCOUNTER — Encounter
Admission: RE | Admit: 2021-08-23 | Discharge: 2021-08-23 | Disposition: A | Discharge status: Home / Self Care | Payer: 59 | Source: Ambulatory Visit | Attending: Neurosurgery | Admitting: Neurosurgery

## 2021-08-23 DIAGNOSIS — Z01810 Encounter for preprocedural cardiovascular examination: Secondary | ICD-10-CM

## 2021-08-23 DIAGNOSIS — Z01818 Encounter for other preprocedural examination: Secondary | ICD-10-CM | POA: Diagnosis not present

## 2021-08-23 HISTORY — DX: Pneumonia, unspecified organism: J18.9

## 2021-08-23 HISTORY — DX: Hemochromatosis, unspecified: E83.119

## 2021-08-23 HISTORY — DX: Gastro-esophageal reflux disease without esophagitis: K21.9

## 2021-08-23 HISTORY — DX: Rheumatoid arthritis, unspecified: M06.9

## 2021-08-23 LAB — URINALYSIS, ROUTINE W REFLEX MICROSCOPIC
Bilirubin Urine: NEGATIVE
Glucose, UA: NEGATIVE mg/dL
Hgb urine dipstick: NEGATIVE
Ketones, ur: NEGATIVE mg/dL
Nitrite: NEGATIVE
Protein, ur: NEGATIVE mg/dL
Specific Gravity, Urine: 1.016 (ref 1.005–1.030)
pH: 5 (ref 5.0–8.0)

## 2021-08-23 LAB — BASIC METABOLIC PANEL
Anion gap: 11 (ref 5–15)
BUN: 6 mg/dL (ref 6–20)
CO2: 22 mmol/L (ref 22–32)
Calcium: 8.5 mg/dL — ABNORMAL LOW (ref 8.9–10.3)
Chloride: 104 mmol/L (ref 98–111)
Creatinine, Ser: 0.47 mg/dL (ref 0.44–1.00)
GFR, Estimated: >60 mL/min (ref >60)
Glucose, Bld: 92 mg/dL (ref 70–99)
Potassium: 3.4 mmol/L — ABNORMAL LOW (ref 3.5–5.1)
Sodium: 137 mmol/L (ref 135–145)

## 2021-08-23 LAB — PROTIME-INR
INR: 1.1 (ref 0.8–1.2)
Prothrombin Time: 14.1 seconds (ref 11.4–15.2)

## 2021-08-23 LAB — CBC
HCT: 38.4 % (ref 36.0–46.0)
Hemoglobin: 14.1 g/dL (ref 12.0–15.0)
MCH: 36.2 pg — ABNORMAL HIGH (ref 26.0–34.0)
MCHC: 36.7 g/dL — ABNORMAL HIGH (ref 30.0–36.0)
MCV: 98.7 fL (ref 80.0–100.0)
Platelets: 207 10*3/uL (ref 150–400)
RBC: 3.89 MIL/uL (ref 3.87–5.11)
RDW: 12 % (ref 11.5–15.5)
WBC: 5 10*3/uL (ref 4.0–10.5)
nRBC: 0 % (ref 0.0–0.2)

## 2021-08-23 LAB — SURGICAL PCR SCREEN
MRSA, PCR: NEGATIVE
Staphylococcus aureus: NEGATIVE

## 2021-08-23 LAB — APTT: aPTT: 29 seconds (ref 24–36)

## 2021-08-23 NOTE — Patient Instructions (Addendum)
Your procedure is scheduled on: Wednesday, December 7 Report to the Registration Desk on the 1st floor of the [facility] To find out your arrival time, please call [phone] between 1PM - 3PM on: Tuesday, December 6  REMEMBER: Instructions that are not followed completely may result in serious medical risk, up to and including death; or upon the discretion of your surgeon and anesthesiologist your surgery may need to be rescheduled.  Do not eat food after midnight the night before surgery.  No gum chewing, lozengers or hard candies.  You may however, drink CLEAR liquids up to 2 hours before you are scheduled to arrive for your surgery. Do not drink anything within 2 hours of your scheduled arrival time.  Clear liquids include: - water  - apple juice without pulp - gatorade (not RED, PURPLE, OR BLUE) - black coffee or tea (Do NOT add milk or creamers to the coffee or tea) Do NOT drink anything that is not on this list.  TAKE THESE MEDICATIONS THE MORNING OF SURGERY WITH A SIP OF WATER:  Omeprazole (Prilosec) - (take one the night before and one on the morning of surgery - helps to prevent nausea after surgery.) Sertraline (Zoloft)  One week prior to surgery: starting November 30 Stop Anti-inflammatories (NSAIDS) such as Advil, Aleve, Ibuprofen, Motrin, Naproxen, Naprosyn and Aspirin based products such as Excedrin, Goodys Powder, BC Powder. Stop ANY OVER THE COUNTER supplements until after surgery. You may however, continue to take Tylenol if needed for pain up until the day of surgery.  Meloxicam - stop 1 week prior to surgery; may resume 3 MONTHS after surgery.  Cosentyx - skip next dose; resume after post-op appt.  No Alcohol for 24 hours before or after surgery.  No Smoking including e-cigarettes for 24 hours prior to surgery.  No chewable tobacco products for at least 6 hours prior to surgery.  No nicotine patches on the day of surgery.  Do not use any  "recreational" drugs for at least a week prior to your surgery.  Please be advised that the combination of cocaine and anesthesia may have negative outcomes, up to and including death. If you test positive for cocaine, your surgery will be cancelled.  On the morning of surgery brush your teeth with toothpaste and water, you may rinse your mouth with mouthwash if you wish. Do not swallow any toothpaste or mouthwash.  Use CHG Soap as directed on instruction sheet.  Do not wear jewelry, make-up, hairpins, clips or nail polish.  Do not wear lotions, powders, or perfumes.   Do not shave body from the neck down 48 hours prior to surgery just in case you cut yourself which could leave a site for infection.  Also, freshly shaved skin may become irritated if using the CHG soap.  Contact lenses, hearing aids and dentures may not be worn into surgery.  Do not bring valuables to the hospital. [facility] is not responsible for any missing/lost belongings or valuables.   Notify your doctor if there is any change in your medical condition (cold, fever, infection).  Wear comfortable clothing (specific to your surgery type) to the hospital.  After surgery, you can help prevent lung complications by doing breathing exercises.  Take deep breaths and cough every 1-2 hours. Your doctor may order a device called an Incentive Spirometer to help you take deep breaths.  If you are being admitted to the hospital overnight, leave your suitcase in the car. After surgery it may be  brought to your room.  If you are being discharged the day of surgery, you will not be allowed to drive home. You will need a responsible adult (18 years or older) to drive you home and stay with you that night.   If you are taking public transportation, you will need to have a responsible adult (18 years or older) with you. Please confirm with your physician that it is acceptable to use public transportation.   Please call the  Pre-admissions Testing Dept. at [phone] if you have any questions about these instructions.  Surgery Visitation Policy:  Patients undergoing a surgery or procedure may have one family member or support person with them as long as that person is not COVID-19 positive or experiencing its symptoms.  That person may remain in the waiting area during the procedure and may rotate out with other people.  Inpatient Visitation:    Visiting hours are 7 a.m. to 8 p.m. Up to two visitors ages 16+ are allowed at one time in a patient room. The visitors may rotate out with other people during the day. Visitors must check out when they leave, or other visitors will not be allowed. One designated support person may remain overnight. The visitor must pass COVID-19 screenings, use hand sanitizer when entering and exiting the patient's room and wear a mask at all times, including in the patient's room. Patients must also wear a mask when staff or their visitor are in the room. Masking is required regardless of vaccination status.

## 2021-09-04 ENCOUNTER — Other Ambulatory Visit
Admission: RE | Admit: 2021-09-04 | Discharge: 2021-09-04 | Disposition: A | Discharge status: Home / Self Care | Payer: 59 | Source: Ambulatory Visit | Attending: Neurosurgery | Admitting: Neurosurgery

## 2021-09-04 DIAGNOSIS — Z20822 Contact with and (suspected) exposure to covid-19: Secondary | ICD-10-CM | POA: Insufficient documentation

## 2021-09-04 DIAGNOSIS — Z01812 Encounter for preprocedural laboratory examination: Secondary | ICD-10-CM | POA: Insufficient documentation

## 2021-09-05 LAB — SARS CORONAVIRUS 2 (TAT 6-24 HRS): SARS Coronavirus 2: NEGATIVE

## 2021-09-06 ENCOUNTER — Inpatient Hospital Stay: Payer: 59

## 2021-09-06 ENCOUNTER — Other Ambulatory Visit: Payer: Self-pay

## 2021-09-06 ENCOUNTER — Encounter
Admission: RE | Disposition: A | Discharge status: Home / Self Care | Payer: Self-pay | Source: Home / Self Care | Attending: Neurosurgery

## 2021-09-06 ENCOUNTER — Inpatient Hospital Stay
Admission: RE | Admit: 2021-09-06 | Discharge: 2021-09-09 | DRG: 455 | Disposition: A | Discharge status: Home / Self Care | Payer: 59 | Attending: Neurosurgery | Admitting: Neurosurgery

## 2021-09-06 ENCOUNTER — Inpatient Hospital Stay: Payer: 59 | Admitting: Certified Registered"

## 2021-09-06 ENCOUNTER — Encounter: Payer: Self-pay | Admitting: Neurosurgery

## 2021-09-06 DIAGNOSIS — M21372 Foot drop, left foot: Secondary | ICD-10-CM | POA: Diagnosis present

## 2021-09-06 DIAGNOSIS — M069 Rheumatoid arthritis, unspecified: Secondary | ICD-10-CM | POA: Diagnosis present

## 2021-09-06 DIAGNOSIS — Z8249 Family history of ischemic heart disease and other diseases of the circulatory system: Secondary | ICD-10-CM

## 2021-09-06 DIAGNOSIS — M4727 Other spondylosis with radiculopathy, lumbosacral region: Secondary | ICD-10-CM | POA: Diagnosis present

## 2021-09-06 DIAGNOSIS — I10 Essential (primary) hypertension: Secondary | ICD-10-CM | POA: Diagnosis present

## 2021-09-06 DIAGNOSIS — M4807 Spinal stenosis, lumbosacral region: Secondary | ICD-10-CM | POA: Diagnosis present

## 2021-09-06 DIAGNOSIS — M5442 Lumbago with sciatica, left side: Secondary | ICD-10-CM | POA: Diagnosis present

## 2021-09-06 DIAGNOSIS — K219 Gastro-esophageal reflux disease without esophagitis: Secondary | ICD-10-CM | POA: Diagnosis present

## 2021-09-06 DIAGNOSIS — M4310 Spondylolisthesis, site unspecified: Secondary | ICD-10-CM

## 2021-09-06 DIAGNOSIS — M4317 Spondylolisthesis, lumbosacral region: Secondary | ICD-10-CM | POA: Diagnosis present

## 2021-09-06 DIAGNOSIS — Z20822 Contact with and (suspected) exposure to covid-19: Secondary | ICD-10-CM | POA: Diagnosis present

## 2021-09-06 DIAGNOSIS — G8929 Other chronic pain: Secondary | ICD-10-CM | POA: Diagnosis present

## 2021-09-06 DIAGNOSIS — M5441 Lumbago with sciatica, right side: Secondary | ICD-10-CM | POA: Diagnosis present

## 2021-09-06 DIAGNOSIS — F411 Generalized anxiety disorder: Secondary | ICD-10-CM | POA: Diagnosis present

## 2021-09-06 DIAGNOSIS — M4316 Spondylolisthesis, lumbar region: Secondary | ICD-10-CM

## 2021-09-06 DIAGNOSIS — Z419 Encounter for procedure for purposes other than remedying health state, unspecified: Secondary | ICD-10-CM

## 2021-09-06 DIAGNOSIS — R531 Weakness: Secondary | ICD-10-CM | POA: Diagnosis present

## 2021-09-06 DIAGNOSIS — M5416 Radiculopathy, lumbar region: Secondary | ICD-10-CM | POA: Diagnosis not present

## 2021-09-06 HISTORY — PX: ABDOMINAL EXPOSURE: SHX5708

## 2021-09-06 HISTORY — PX: ANTERIOR LATERAL LUMBAR FUSION WITH PERCUTANEOUS SCREW 1 LEVEL: SHX5553

## 2021-09-06 LAB — BASIC METABOLIC PANEL
Anion gap: 7 (ref 5–15)
BUN: <5 mg/dL — ABNORMAL LOW (ref 6–20)
CO2: 23 mmol/L (ref 22–32)
Calcium: 7.7 mg/dL — ABNORMAL LOW (ref 8.9–10.3)
Chloride: 108 mmol/L (ref 98–111)
Creatinine, Ser: 0.55 mg/dL (ref 0.44–1.00)
GFR, Estimated: >60 mL/min (ref >60)
Glucose, Bld: 209 mg/dL — ABNORMAL HIGH (ref 70–99)
Potassium: 4.7 mmol/L (ref 3.5–5.1)
Sodium: 138 mmol/L (ref 135–145)

## 2021-09-06 LAB — CBC
HCT: 32.4 % — ABNORMAL LOW (ref 36.0–46.0)
Hemoglobin: 11.7 g/dL — ABNORMAL LOW (ref 12.0–15.0)
MCH: 35.5 pg — ABNORMAL HIGH (ref 26.0–34.0)
MCHC: 36.1 g/dL — ABNORMAL HIGH (ref 30.0–36.0)
MCV: 98.2 fL (ref 80.0–100.0)
Platelets: 151 10*3/uL (ref 150–400)
RBC: 3.3 MIL/uL — ABNORMAL LOW (ref 3.87–5.11)
RDW: 13.7 % (ref 11.5–15.5)
WBC: 9.4 10*3/uL (ref 4.0–10.5)
nRBC: 0 % (ref 0.0–0.2)

## 2021-09-06 LAB — POCT PREGNANCY, URINE: Preg Test, Ur: NEGATIVE

## 2021-09-06 SURGERY — ANTERIOR LATERAL LUMBAR FUSION WITH PERCUTANEOUS SCREW 1 LEVEL
Anesthesia: General

## 2021-09-06 MED ORDER — SERTRALINE HCL 25 MG PO TABS
25.0000 mg | ORAL_TABLET | Freq: Every day | ORAL | Status: DC
  Filled 2021-09-06: qty 1

## 2021-09-06 MED ORDER — ESMOLOL HCL 100 MG/10ML IV SOLN
INTRAVENOUS | Status: AC
  Filled 2021-09-06: qty 10

## 2021-09-06 MED ORDER — DEXAMETHASONE SODIUM PHOSPHATE 10 MG/ML IJ SOLN
INTRAMUSCULAR | Status: DC | PRN
  Administered 2021-09-06: 10 mg via INTRAVENOUS

## 2021-09-06 MED ORDER — ENOXAPARIN SODIUM 40 MG/0.4ML IJ SOSY
40.0000 mg | PREFILLED_SYRINGE | INTRAMUSCULAR | Status: DC
Start: 2021-09-06 — End: 2021-09-09
  Administered 2021-09-06 – 2021-09-08 (×3): 40 mg via SUBCUTANEOUS
  Filled 2021-09-06 (×3): qty 0.4

## 2021-09-06 MED ORDER — PROPOFOL 10 MG/ML IV BOLUS
INTRAVENOUS | Status: AC
  Filled 2021-09-06: qty 60

## 2021-09-06 MED ORDER — FENTANYL CITRATE (PF) 100 MCG/2ML IJ SOLN
INTRAMUSCULAR | Status: DC | PRN
  Administered 2021-09-06 (×4): 50 ug via INTRAVENOUS

## 2021-09-06 MED ORDER — MIDAZOLAM HCL 2 MG/2ML IJ SOLN
INTRAMUSCULAR | Status: AC
  Filled 2021-09-06: qty 2

## 2021-09-06 MED ORDER — ACETAMINOPHEN 10 MG/ML IV SOLN: 1000.0000 mg | Freq: Once | INTRAVENOUS | Status: DC | PRN

## 2021-09-06 MED ORDER — OXYCODONE HCL 5 MG PO TABS
ORAL_TABLET | ORAL | Status: AC
  Filled 2021-09-06: qty 1

## 2021-09-06 MED ORDER — FENTANYL CITRATE (PF) 100 MCG/2ML IJ SOLN
INTRAMUSCULAR | Status: AC
  Administered 2021-09-06: 25 ug via INTRAVENOUS
  Filled 2021-09-06: qty 2

## 2021-09-06 MED ORDER — SURGIFLO WITH THROMBIN (HEMOSTATIC MATRIX KIT) OPTIME
TOPICAL | Status: DC | PRN
  Administered 2021-09-06: 1

## 2021-09-06 MED ORDER — PHENYLEPHRINE HCL (PRESSORS) 10 MG/ML IV SOLN
INTRAVENOUS | Status: AC
  Filled 2021-09-06: qty 1

## 2021-09-06 MED ORDER — ONDANSETRON HCL 4 MG/2ML IJ SOLN: 4.0000 mg | Freq: Once | INTRAMUSCULAR | Status: DC | PRN

## 2021-09-06 MED ORDER — SUCCINYLCHOLINE CHLORIDE 200 MG/10ML IV SOSY
PREFILLED_SYRINGE | INTRAVENOUS | Status: DC | PRN
  Administered 2021-09-06: 100 mg via INTRAVENOUS

## 2021-09-06 MED ORDER — ALBUMIN HUMAN 5 % IV SOLN
INTRAVENOUS | Status: AC
  Filled 2021-09-06: qty 500

## 2021-09-06 MED ORDER — LACTATED RINGERS IV SOLN: INTRAVENOUS | Status: DC | PRN

## 2021-09-06 MED ORDER — 0.9 % SODIUM CHLORIDE (POUR BTL) OPTIME
TOPICAL | Status: DC | PRN
  Administered 2021-09-06: 1000 mL

## 2021-09-06 MED ORDER — BISACODYL 10 MG RE SUPP
10.0000 mg | Freq: Every day | RECTAL | Status: DC | PRN
  Administered 2021-09-09: 10 mg via RECTAL
  Filled 2021-09-06 (×2): qty 1

## 2021-09-06 MED ORDER — HYDROMORPHONE HCL 1 MG/ML IJ SOLN
INTRAMUSCULAR | Status: AC
  Administered 2021-09-06: 0.5 mg via INTRAVENOUS
  Filled 2021-09-06: qty 1

## 2021-09-06 MED ORDER — ONDANSETRON HCL 4 MG/2ML IJ SOLN
4.0000 mg | Freq: Four times a day (QID) | INTRAMUSCULAR | Status: DC | PRN
  Administered 2021-09-06: 4 mg via INTRAVENOUS
  Filled 2021-09-06: qty 2

## 2021-09-06 MED ORDER — EPHEDRINE SULFATE 50 MG/ML IJ SOLN
INTRAMUSCULAR | Status: AC
  Filled 2021-09-06: qty 1

## 2021-09-06 MED ORDER — SODIUM CHLORIDE 0.9 % IV SOLN: INTRAVENOUS | Status: DC | PRN

## 2021-09-06 MED ORDER — OXYCODONE HCL 5 MG PO TABS: 5.0000 mg | ORAL_TABLET | ORAL | Status: DC | PRN

## 2021-09-06 MED ORDER — GLYCOPYRROLATE 0.2 MG/ML IJ SOLN
INTRAMUSCULAR | Status: DC | PRN
  Administered 2021-09-06: .2 mg via INTRAVENOUS

## 2021-09-06 MED ORDER — VANCOMYCIN HCL 1250 MG/250ML IV SOLN
1250.0000 mg | INTRAVENOUS | Status: AC
  Administered 2021-09-06: 1250 mg via INTRAVENOUS
  Filled 2021-09-06: qty 250

## 2021-09-06 MED ORDER — MIDAZOLAM HCL 2 MG/2ML IJ SOLN
INTRAMUSCULAR | Status: DC | PRN
  Administered 2021-09-06: 2 mg via INTRAVENOUS

## 2021-09-06 MED ORDER — REMIFENTANIL HCL 1 MG IV SOLR
INTRAVENOUS | Status: AC
  Filled 2021-09-06: qty 1000

## 2021-09-06 MED ORDER — LIDOCAINE HCL (CARDIAC) PF 100 MG/5ML IV SOSY
PREFILLED_SYRINGE | INTRAVENOUS | Status: DC | PRN
  Administered 2021-09-06: 100 mg via INTRAVENOUS

## 2021-09-06 MED ORDER — ROCURONIUM BROMIDE 100 MG/10ML IV SOLN
INTRAVENOUS | Status: DC | PRN
  Administered 2021-09-06: 20 mg via INTRAVENOUS
  Administered 2021-09-06: 50 mg via INTRAVENOUS
  Administered 2021-09-06: 20 mg via INTRAVENOUS

## 2021-09-06 MED ORDER — OXYCODONE HCL 5 MG PO TABS
10.0000 mg | ORAL_TABLET | ORAL | Status: DC | PRN
  Administered 2021-09-06 – 2021-09-07 (×3): 10 mg via ORAL
  Filled 2021-09-06 (×3): qty 2

## 2021-09-06 MED ORDER — ACETAMINOPHEN 500 MG PO TABS
1000.0000 mg | ORAL_TABLET | Freq: Four times a day (QID) | ORAL | Status: AC
  Administered 2021-09-07 (×3): 1000 mg via ORAL
  Filled 2021-09-06 (×3): qty 2

## 2021-09-06 MED ORDER — SUGAMMADEX SODIUM 200 MG/2ML IV SOLN
INTRAVENOUS | Status: DC | PRN
  Administered 2021-09-06: 200 mg via INTRAVENOUS

## 2021-09-06 MED ORDER — ACETAMINOPHEN 10 MG/ML IV SOLN
INTRAVENOUS | Status: AC
  Filled 2021-09-06: qty 100

## 2021-09-06 MED ORDER — SODIUM CHLORIDE 0.9% FLUSH: 3.0000 mL | INTRAVENOUS | Status: DC | PRN

## 2021-09-06 MED ORDER — HYDROMORPHONE HCL 1 MG/ML IJ SOLN
0.5000 mg | INTRAMUSCULAR | Status: AC | PRN
  Administered 2021-09-06 (×2): 0.5 mg via INTRAVENOUS

## 2021-09-06 MED ORDER — FENTANYL CITRATE (PF) 100 MCG/2ML IJ SOLN
25.0000 ug | INTRAMUSCULAR | Status: AC | PRN
  Administered 2021-09-06 (×4): 25 ug via INTRAVENOUS

## 2021-09-06 MED ORDER — ONDANSETRON HCL 4 MG PO TABS: 4.0000 mg | ORAL_TABLET | Freq: Four times a day (QID) | ORAL | Status: DC | PRN

## 2021-09-06 MED ORDER — PHENOL 1.4 % MT LIQD
1.0000 | OROMUCOSAL | Status: DC | PRN
  Filled 2021-09-06: qty 177

## 2021-09-06 MED ORDER — PROPOFOL 10 MG/ML IV BOLUS
INTRAVENOUS | Status: DC | PRN
  Administered 2021-09-06: 200 mg via INTRAVENOUS

## 2021-09-06 MED ORDER — CHLORHEXIDINE GLUCONATE 0.12 % MT SOLN: 15.0000 mL | Freq: Once | OROMUCOSAL | Status: AC

## 2021-09-06 MED ORDER — SODIUM CHLORIDE 0.9 % IV SOLN: INTRAVENOUS | Status: DC

## 2021-09-06 MED ORDER — METHOCARBAMOL 1000 MG/10ML IJ SOLN
500.0000 mg | Freq: Four times a day (QID) | INTRAVENOUS | Status: DC | PRN
  Administered 2021-09-06: 500 mg via INTRAVENOUS
  Filled 2021-09-06 (×2): qty 5

## 2021-09-06 MED ORDER — ORAL CARE MOUTH RINSE: 15.0000 mL | Freq: Once | OROMUCOSAL | Status: AC

## 2021-09-06 MED ORDER — REMIFENTANIL HCL 1 MG IV SOLR
INTRAVENOUS | Status: DC | PRN
  Administered 2021-09-06: .1 ug/kg/min via INTRAVENOUS

## 2021-09-06 MED ORDER — BUPIVACAINE-EPINEPHRINE (PF) 0.5% -1:200000 IJ SOLN
INTRAMUSCULAR | Status: AC
  Filled 2021-09-06: qty 30

## 2021-09-06 MED ORDER — SODIUM CHLORIDE 0.9 % IV SOLN: 250.0000 mL | INTRAVENOUS | Status: DC

## 2021-09-06 MED ORDER — FENTANYL CITRATE (PF) 100 MCG/2ML IJ SOLN
INTRAMUSCULAR | Status: AC
  Filled 2021-09-06: qty 2

## 2021-09-06 MED ORDER — ACETAMINOPHEN 325 MG PO TABS: 650.0000 mg | ORAL_TABLET | ORAL | Status: DC | PRN

## 2021-09-06 MED ORDER — VASOPRESSIN 20 UNIT/ML IV SOLN
INTRAVENOUS | Status: DC | PRN
  Administered 2021-09-06: 2 [IU] via INTRAVENOUS

## 2021-09-06 MED ORDER — SODIUM CHLORIDE FLUSH 0.9 % IV SOLN
INTRAVENOUS | Status: AC
  Filled 2021-09-06: qty 20

## 2021-09-06 MED ORDER — LACTATED RINGERS IV SOLN: INTRAVENOUS | Status: DC

## 2021-09-06 MED ORDER — CHLORHEXIDINE GLUCONATE 0.12 % MT SOLN
OROMUCOSAL | Status: AC
  Administered 2021-09-06: 15 mL via OROMUCOSAL
  Filled 2021-09-06: qty 15

## 2021-09-06 MED ORDER — EPHEDRINE SULFATE 50 MG/ML IJ SOLN
INTRAMUSCULAR | Status: DC | PRN
  Administered 2021-09-06: 10 mg via INTRAVENOUS
  Administered 2021-09-06: 5 mg via INTRAVENOUS
  Administered 2021-09-06: 10 mg via INTRAVENOUS
  Administered 2021-09-06 (×3): 5 mg via INTRAVENOUS

## 2021-09-06 MED ORDER — SODIUM CHLORIDE 0.9% FLUSH
3.0000 mL | Freq: Two times a day (BID) | INTRAVENOUS | Status: DC
  Administered 2021-09-06 – 2021-09-09 (×5): 3 mL via INTRAVENOUS

## 2021-09-06 MED ORDER — POLYETHYLENE GLYCOL 3350 17 G PO PACK
17.0000 g | PACK | Freq: Every day | ORAL | Status: DC | PRN
  Administered 2021-09-09: 17 g via ORAL
  Filled 2021-09-06: qty 1

## 2021-09-06 MED ORDER — ACETAMINOPHEN 10 MG/ML IV SOLN
INTRAVENOUS | Status: DC | PRN
  Administered 2021-09-06: 1000 mg via INTRAVENOUS

## 2021-09-06 MED ORDER — PHENYLEPHRINE HCL-NACL 20-0.9 MG/250ML-% IV SOLN
INTRAVENOUS | Status: DC | PRN
  Administered 2021-09-06: 20 ug/min via INTRAVENOUS

## 2021-09-06 MED ORDER — ESMOLOL HCL 100 MG/10ML IV SOLN
INTRAVENOUS | Status: DC | PRN
  Administered 2021-09-06: 20 mg via INTRAVENOUS

## 2021-09-06 MED ORDER — OXYCODONE HCL 5 MG/5ML PO SOLN: 5.0000 mg | Freq: Once | ORAL | Status: AC | PRN

## 2021-09-06 MED ORDER — PANTOPRAZOLE SODIUM 40 MG PO TBEC
40.0000 mg | DELAYED_RELEASE_TABLET | Freq: Every day | ORAL | Status: DC
  Administered 2021-09-07 – 2021-09-09 (×3): 40 mg via ORAL
  Filled 2021-09-06 (×3): qty 1

## 2021-09-06 MED ORDER — SENNA 8.6 MG PO TABS
1.0000 | ORAL_TABLET | Freq: Two times a day (BID) | ORAL | Status: DC
  Administered 2021-09-06 – 2021-09-08 (×5): 8.6 mg via ORAL
  Filled 2021-09-06 (×6): qty 1

## 2021-09-06 MED ORDER — HYDROMORPHONE HCL 1 MG/ML IJ SOLN
INTRAMUSCULAR | Status: AC
  Filled 2021-09-06: qty 1

## 2021-09-06 MED ORDER — ACETAMINOPHEN 650 MG RE SUPP
650.0000 mg | RECTAL | Status: DC | PRN
  Filled 2021-09-06: qty 1

## 2021-09-06 MED ORDER — OXYCODONE HCL 5 MG PO TABS
5.0000 mg | ORAL_TABLET | Freq: Once | ORAL | Status: AC | PRN
  Administered 2021-09-06: 5 mg via ORAL

## 2021-09-06 MED ORDER — BUPIVACAINE HCL (PF) 0.5 % IJ SOLN
INTRAMUSCULAR | Status: AC
  Filled 2021-09-06: qty 30

## 2021-09-06 MED ORDER — ALBUMIN HUMAN 5 % IV SOLN
INTRAVENOUS | Status: AC
  Filled 2021-09-06: qty 250

## 2021-09-06 MED ORDER — ALBUMIN HUMAN 5 % IV SOLN: INTRAVENOUS | Status: DC | PRN

## 2021-09-06 MED ORDER — SODIUM CHLORIDE FLUSH 0.9 % IV SOLN
INTRAVENOUS | Status: AC
  Administered 2021-09-06: 10 mL
  Filled 2021-09-06: qty 10

## 2021-09-06 MED ORDER — MENTHOL 3 MG MT LOZG
1.0000 | LOZENGE | OROMUCOSAL | Status: DC | PRN
  Filled 2021-09-06: qty 9

## 2021-09-06 MED ORDER — FLEET ENEMA 7-19 GM/118ML RE ENEM: 1.0000 | ENEMA | Freq: Once | RECTAL | Status: DC | PRN

## 2021-09-06 MED ORDER — PHENYLEPHRINE HCL (PRESSORS) 10 MG/ML IV SOLN
INTRAVENOUS | Status: DC | PRN
  Administered 2021-09-06 (×2): 160 ug via INTRAVENOUS
  Administered 2021-09-06: 80 ug via INTRAVENOUS

## 2021-09-06 MED ORDER — BUPIVACAINE-EPINEPHRINE (PF) 0.5% -1:200000 IJ SOLN
INTRAMUSCULAR | Status: DC | PRN
  Administered 2021-09-06: 6 mL

## 2021-09-06 MED ORDER — METHOCARBAMOL 500 MG PO TABS
500.0000 mg | ORAL_TABLET | Freq: Four times a day (QID) | ORAL | Status: DC | PRN
  Administered 2021-09-06 – 2021-09-08 (×3): 500 mg via ORAL
  Filled 2021-09-06 (×3): qty 1

## 2021-09-06 MED ORDER — BUPIVACAINE LIPOSOME 1.3 % IJ SUSP
INTRAMUSCULAR | Status: AC
  Filled 2021-09-06: qty 20

## 2021-09-06 MED ORDER — CEFAZOLIN SODIUM-DEXTROSE 2-4 GM/100ML-% IV SOLN
2.0000 g | INTRAVENOUS | Status: AC
  Administered 2021-09-06 (×2): 2 g via INTRAVENOUS

## 2021-09-06 MED ORDER — HYDROMORPHONE HCL 1 MG/ML IJ SOLN
INTRAMUSCULAR | Status: DC | PRN
  Administered 2021-09-06: 1 mg via INTRAVENOUS

## 2021-09-06 MED ORDER — ONDANSETRON HCL 4 MG/2ML IJ SOLN
INTRAMUSCULAR | Status: DC | PRN
  Administered 2021-09-06 (×2): 4 mg via INTRAVENOUS

## 2021-09-06 MED ORDER — CEFAZOLIN SODIUM-DEXTROSE 2-4 GM/100ML-% IV SOLN
INTRAVENOUS | Status: AC
  Filled 2021-09-06: qty 100

## 2021-09-06 SURGICAL SUPPLY — 152 items
ADH SKN CLS APL DERMABOND .7 (GAUZE/BANDAGES/DRESSINGS) ×1
AGENT HMST KT MTR STRL THRMB (HEMOSTASIS) ×1
APL PRP STRL LF DISP 70% ISPRP (MISCELLANEOUS) ×3
APPLIER CLIP 11 MED OPEN (CLIP)
APPLIER CLIP 13 LRG OPEN (CLIP)
APPLIER CLIP 9.375 SM OPEN (CLIP)
APR CLP LRG 13 20 CLIP (CLIP)
APR CLP MED 11 20 MLT OPN (CLIP)
APR CLP SM 9.3 20 MLT OPN (CLIP)
BLADE CLIPPER SURG (BLADE) ×2 IMPLANT
BUR NEURO DRILL SOFT 3.0X3.8M (BURR) ×1 IMPLANT
CAP LOCKING SPINE (Cap) ×4 IMPLANT
CHLORAPREP W/TINT 26 (MISCELLANEOUS) ×8 IMPLANT
CLIP APPLIE 11 MED OPEN (CLIP) ×1 IMPLANT
CLIP APPLIE 13 LRG OPEN (CLIP) IMPLANT
CLIP APPLIE 9.375 SM OPEN (CLIP) IMPLANT
CORD BIP STRL DISP 12FT (MISCELLANEOUS) ×2 IMPLANT
COUNTER NEEDLE 20/40 LG (NEEDLE) ×4 IMPLANT
COVER BACK TABLE REUSABLE LG (DRAPES) ×4 IMPLANT
CUP MEDICINE 2OZ PLAST GRAD ST (MISCELLANEOUS) ×2 IMPLANT
DERMABOND ADVANCED (GAUZE/BANDAGES/DRESSINGS) ×1
DERMABOND ADVANCED .7 DNX12 (GAUZE/BANDAGES/DRESSINGS) ×2 IMPLANT
DISSECTOR STICK (MISCELLANEOUS) ×1 IMPLANT
DRAPE 3D C-ARM OEC (DRAPES) ×1 IMPLANT
DRAPE C ARM PK CFD 31 SPINE (DRAPES) ×4 IMPLANT
DRAPE C-ARM XRAY 36X54 (DRAPES) ×1 IMPLANT
DRAPE C-ARMOR (DRAPES) ×2 IMPLANT
DRAPE INCISE IOBAN 66X45 STRL (DRAPES) ×2 IMPLANT
DRAPE INCISE IOBAN 66X60 STRL (DRAPES) ×3 IMPLANT
DRAPE LAPAROTOMY 100X77 ABD (DRAPES) ×2 IMPLANT
DRAPE SCAN PATIENT (DRAPES) ×1 IMPLANT
DRAPE SURG 17X11 SM STRL (DRAPES) ×12 IMPLANT
DRESSING SURGICEL FIBRLLR 1X2 (HEMOSTASIS) IMPLANT
DRSG OPSITE POSTOP 4X12 (GAUZE/BANDAGES/DRESSINGS) ×1 IMPLANT
DRSG OPSITE POSTOP 4X14 (GAUZE/BANDAGES/DRESSINGS) IMPLANT
DRSG OPSITE POSTOP 4X6 (GAUZE/BANDAGES/DRESSINGS) ×2 IMPLANT
DRSG SURGICEL FIBRILLAR 1X2 (HEMOSTASIS)
DRSG TEGADERM 2-3/8X2-3/4 SM (GAUZE/BANDAGES/DRESSINGS) IMPLANT
DRSG TEGADERM 4X4.75 (GAUZE/BANDAGES/DRESSINGS) IMPLANT
DRSG TEGADERM 6X8 (GAUZE/BANDAGES/DRESSINGS) IMPLANT
DRSG TELFA 3X8 NADH (GAUZE/BANDAGES/DRESSINGS) IMPLANT
DRSG TELFA 4X3 1S NADH ST (GAUZE/BANDAGES/DRESSINGS) IMPLANT
ELECT BLADE 6.5 EXT (BLADE) ×2 IMPLANT
ELECT CAUTERY BLADE 6.4 (BLADE) ×2 IMPLANT
ELECT CAUTERY BLADE TIP 2.5 (TIP) ×2
ELECT EZSTD 165MM 6.5IN (MISCELLANEOUS) ×2
ELECT REM PT RETURN 9FT ADLT (ELECTROSURGICAL) ×6
ELECTRODE CAUTERY BLDE TIP 2.5 (TIP) ×1 IMPLANT
ELECTRODE EZSTD 165MM 6.5IN (MISCELLANEOUS) ×1 IMPLANT
ELECTRODE REM PT RTRN 9FT ADLT (ELECTROSURGICAL) ×3 IMPLANT
FEE INTRAOP CADWELL SUPPLY NCS (MISCELLANEOUS) ×1 IMPLANT
FEE INTRAOP MONITOR IMPULS NCS (MISCELLANEOUS) IMPLANT
FORCEPS BPLR BAYO 10IN 1.0TIP (ORTHOPEDIC DISPOSABLE SUPPLIES) ×1 IMPLANT
GAUZE 4X4 16PLY ~~LOC~~+RFID DBL (SPONGE) ×3 IMPLANT
GLOVE SURG ENC MOIS LTX SZ7 (GLOVE) ×6 IMPLANT
GLOVE SURG SYN 6.5 ES PF (GLOVE) ×4 IMPLANT
GLOVE SURG SYN 6.5 PF PI (GLOVE) ×1 IMPLANT
GLOVE SURG SYN 8.0 (GLOVE) IMPLANT
GLOVE SURG SYN 8.0 PF PI (GLOVE) ×2 IMPLANT
GLOVE SURG SYN 8.5  E (GLOVE) ×12
GLOVE SURG SYN 8.5 E (GLOVE) ×6 IMPLANT
GLOVE SURG SYN 8.5 PF PI (GLOVE) ×6 IMPLANT
GLOVE SURG UNDER LTX SZ7.5 (GLOVE) ×2 IMPLANT
GLOVE SURG UNDER POLY LF SZ6.5 (GLOVE) ×4 IMPLANT
GOWN SRG LRG LVL 4 IMPRV REINF (GOWNS) ×1 IMPLANT
GOWN SRG XL LVL 3 NONREINFORCE (GOWNS) ×2 IMPLANT
GOWN STRL NON-REIN TWL XL LVL3 (GOWNS) ×6
GOWN STRL REIN LRG LVL4 (GOWNS) ×2
GOWN STRL REUS W/ TWL LRG LVL3 (GOWN DISPOSABLE) ×2 IMPLANT
GOWN STRL REUS W/ TWL XL LVL3 (GOWN DISPOSABLE) ×5 IMPLANT
GOWN STRL REUS W/TWL LRG LVL3 (GOWN DISPOSABLE) ×6
GOWN STRL REUS W/TWL XL LVL3 (GOWN DISPOSABLE) ×4
GRADUATE 1200CC STRL 31836 (MISCELLANEOUS) ×2 IMPLANT
GUIDE ARCUS ACCESS SYSTEM STER (MISCELLANEOUS) ×1 IMPLANT
INTRAOP CADWELL SUPPLY FEE NCS (MISCELLANEOUS) ×1
INTRAOP DISP SUPPLY FEE NCS (MISCELLANEOUS) ×2
INTRAOP MONITOR FEE IMPULS NCS (MISCELLANEOUS) ×1
INTRAOP MONITOR FEE IMPULSE (MISCELLANEOUS) ×2
K-WIRE 1.6 NITINOL SHARP TIP (WIRE) ×8
KIT DISP MARS 3V (KITS) ×1 IMPLANT
KIT INFUSE MEDIUM (Orthopedic Implant) ×1 IMPLANT
KIT PEDICLE ACCESS (KITS) ×1 IMPLANT
KIT SPINAL PRONEVIEW (KITS) ×2 IMPLANT
KIT TURNOVER KIT A (KITS) ×4 IMPLANT
KNIFE BOYONETTED ANNULOTOMY (MISCELLANEOUS) ×1 IMPLANT
KWIRE 1.6 NITINOL SHARP TIP (WIRE) IMPLANT
LABEL OR SOLS (LABEL) ×2 IMPLANT
LOOP RED MAXI  1X406MM (MISCELLANEOUS) ×2
LOOP VESSEL MAXI  1X406 RED (MISCELLANEOUS) ×2
LOOP VESSEL MAXI 1X406 RED (MISCELLANEOUS) ×1 IMPLANT
LOOP VESSEL MINI 0.8X406 BLUE (MISCELLANEOUS) ×2 IMPLANT
LOOPS BLUE MINI 0.8X406MM (MISCELLANEOUS) ×2
MANIFOLD NEPTUNE II (INSTRUMENTS) ×4 IMPLANT
MARKER SKIN DUAL TIP RULER LAB (MISCELLANEOUS) ×5 IMPLANT
NDL SAFETY ECLIPSE 18X1.5 (NEEDLE) ×1 IMPLANT
NEEDLE HYPO 18GX1.5 SHARP (NEEDLE) ×2
NEEDLE HYPO 22GX1.5 SAFETY (NEEDLE) ×2 IMPLANT
NS IRRIG 1000ML POUR BTL (IV SOLUTION) ×2 IMPLANT
PACK BASIN MAJOR ARMC (MISCELLANEOUS) ×1 IMPLANT
PACK LAMINECTOMY NEURO (CUSTOM PROCEDURE TRAY) ×2 IMPLANT
PACK UNIVERSAL (MISCELLANEOUS) ×3 IMPLANT
PAD ARMBOARD 7.5X6 YLW CONV (MISCELLANEOUS) ×5 IMPLANT
PAD DRESSING TELFA 3X8 NADH (GAUZE/BANDAGES/DRESSINGS) IMPLANT
PENCIL ELECTRO HAND CTR (MISCELLANEOUS) ×2 IMPLANT
PLEDGET CV PTFE 7X3 (MISCELLANEOUS) ×3 IMPLANT
RETAINER VISCERA MED (MISCELLANEOUS) ×1 IMPLANT
ROD SPINE CURVE 5.5X55 (Rod) ×2 IMPLANT
SCREW CANN SPINE 6.5X45 (Screw) ×4 IMPLANT
SCREW CREO MIS 30 TULIP (Screw) ×4 IMPLANT
SCREW LOCKING SD 25 (Screw) ×4 IMPLANT
SPACER SMALL 31X13X25 15D (Spacer) ×1 IMPLANT
SPONGE GAUZE 2X2 8PLY STRL LF (GAUZE/BANDAGES/DRESSINGS) IMPLANT
SPONGE KITTNER 5P (MISCELLANEOUS) ×1 IMPLANT
SPONGE T-LAP 18X18 ~~LOC~~+RFID (SPONGE) ×8 IMPLANT
SPONGE T-LAP 18X36 ~~LOC~~+RFID STR (SPONGE) ×1 IMPLANT
STAPLER SKIN PROX 35W (STAPLE) ×3 IMPLANT
SURGIFLO W/THROMBIN 8M KIT (HEMOSTASIS) ×2 IMPLANT
SUT DVC VLOC 3-0 CL 6 P-12 (SUTURE) ×2 IMPLANT
SUT ETHILON 3-0 FS-10 30 BLK (SUTURE) ×2
SUT MNCRL 4-0 (SUTURE) ×2
SUT MNCRL 4-0 27XMFL (SUTURE) ×1
SUT PDS AB 1 TP1 96 (SUTURE) ×4 IMPLANT
SUT PROLENE 3 0 SH DA (SUTURE) ×1 IMPLANT
SUT PROLENE 5 0 RB 1 DA (SUTURE) ×8 IMPLANT
SUT SILK 2 0 (SUTURE)
SUT SILK 2 0 SH (SUTURE) IMPLANT
SUT SILK 2-0 30XBRD TIE 12 (SUTURE) ×1 IMPLANT
SUT SILK 3 0 (SUTURE) ×2
SUT SILK 3 0 REEL (SUTURE) ×2 IMPLANT
SUT SILK 3-0 18XBRD TIE 12 (SUTURE) IMPLANT
SUT SILK 4 0 (SUTURE) ×2
SUT SILK 4-0 18XBRD TIE 12 (SUTURE) IMPLANT
SUT V-LOC 90 ABS DVC 3-0 CL (SUTURE) ×1 IMPLANT
SUT VIC AB 0 CT1 18XCR BRD 8 (SUTURE) IMPLANT
SUT VIC AB 0 CT1 27 (SUTURE) ×2
SUT VIC AB 0 CT1 27XCR 8 STRN (SUTURE) ×1 IMPLANT
SUT VIC AB 0 CT1 8-18 (SUTURE) ×2
SUT VIC AB 2-0 CT1 (SUTURE) ×5 IMPLANT
SUT VIC AB 2-0 CT1 18 (SUTURE) ×5 IMPLANT
SUT VIC AB 3-0 SH 27 (SUTURE) ×4
SUT VIC AB 3-0 SH 27X BRD (SUTURE) ×2 IMPLANT
SUT VICRYL+ 3-0 36IN CT-1 (SUTURE) ×3 IMPLANT
SUTURE EHLN 3-0 FS-10 30 BLK (SUTURE) IMPLANT
SUTURE MNCRL 4-0 27XMF (SUTURE) IMPLANT
SYR 10ML LL (SYRINGE) ×2 IMPLANT
SYR 20ML LL LF (SYRINGE) IMPLANT
SYR 30ML LL (SYRINGE) ×4 IMPLANT
SYR BULB IRRIG 60ML STRL (SYRINGE) ×1 IMPLANT
TOWEL OR 17X26 4PK STRL BLUE (TOWEL DISPOSABLE) ×10 IMPLANT
TRAY FOLEY MTR SLVR 16FR STAT (SET/KITS/TRAYS/PACK) ×3 IMPLANT
TUBING CONNECTING 10 (TUBING) ×6 IMPLANT
WATER STERILE IRR 500ML POUR (IV SOLUTION) ×2 IMPLANT

## 2021-09-06 NOTE — Anesthesia Procedure Notes (Signed)
Procedure Name: Intubation Date/Time: 09/06/2021 8:36 AM Performed by: Mohammed Kindle, CRNA Pre-anesthesia Checklist: Patient identified, Emergency Drugs available, Suction available and Patient being monitored Patient Re-evaluated:Patient Re-evaluated prior to induction Oxygen Delivery Method: Circle system utilized Preoxygenation: Pre-oxygenation with 100% oxygen Induction Type: IV induction Ventilation: Mask ventilation without difficulty Laryngoscope Size: McGraph and 3 Grade View: Grade I Tube type: Oral Number of attempts: 1 Airway Equipment and Method: Stylet and Oral airway Placement Confirmation: ETT inserted through vocal cords under direct vision, positive ETCO2, breath sounds checked- equal and bilateral and CO2 detector Secured at: 21 cm Tube secured with: Tape Dental Injury: Teeth and Oropharynx as per pre-operative assessment

## 2021-09-06 NOTE — Progress Notes (Addendum)
Patient arrived to room 141 from PACU at 2004 pm.

## 2021-09-06 NOTE — Progress Notes (Signed)
Pharmacy Antibiotic Note  Phyllis Myers is a 40 y.o. female admitted on (Not on file) with surgical prophylaxis.  Pharmacy has been consulted for Vancomycin, Cefazolin dosing.  Plan:  TBW = 79.8 kg   Cefazolin 2 gm IV X 60 min pre-op ordered for 12/7 @ 500.  Vancomycin 1250 mg IV X 1 60 min pre-op ordered for 12/7 @ 0500.      No data recorded.  No results for input(s): WBC, CREATININE, LATICACIDVEN, VANCOTROUGH, VANCOPEAK, VANCORANDOM, GENTTROUGH, GENTPEAK, GENTRANDOM, TOBRATROUGH, TOBRAPEAK, TOBRARND, AMIKACINPEAK, AMIKACINTROU, AMIKACIN in the last 168 hours.  Estimated Creatinine Clearance: 95.6 mL/min (by C-G formula based on SCr of 0.47 mg/dL).    Allergies  Allergen Reactions   Amoxicillin     Muscle pain   Naproxen Sodium Rash    Heavy doses    Antimicrobials this admission:   >>    >>   Dose adjustments this admission:   Microbiology results:  BCx:   UCx:    Sputum:    MRSA PCR:   Thank you for allowing pharmacy to be a part of this patient's care.  Aren Cherne D 09/06/2021 3:39 AM

## 2021-09-06 NOTE — H&P (Signed)
History of Present Illness: 09/06/2021 Phyllis Myers presents today for surgical intervention.  She continues to have symptoms.  07/21/2021  Phyllis Myers returns to see me to review her MRI scan. She has worsening right-sided L5 radicular symptoms. This is now dramatically impacting her life. She is ready to consider moving forward with surgery.  05/04/2021 Phyllis Myers presents today to discuss her symptoms. She has been undergoing physical therapy and injections without improvement. She has continued severe pain mostly in her right leg. She is also having significant back pain.  01/12/2021 Phyllis Myers returns to see me. She has begun having some discomfort down her right leg. It is in her buttock and down the back of her leg. She had surgery last year for left-sided peroneal nerve release. This did help her some. She previously had an injection in December 2020 which helped her significantly.  11/26/2019 Phyllis Myers returns to see me. Since her last visit, she has had a facet injection. This helped during the day but then her back pain worsened again. She continues to have weakness in her feet. She was seen by Dr. Manuella Ghazi, who performed an additional nerve conduction study were reviewed below.  09/10/2019 - B L5-S1 facet injections  08/20/2019 Phyllis Myers is here today with a chief complaint of low back pain, left hip pain, pain and numbness in left ankle and big toe, left foot drop.  She began having problems 8 months ago. Of note, she has had rheumatoid arthritis for a little over a decade and has had 3 different bouts between which she had long periods of remission. Her most recent episode of rheumatoid arthritis flare started 8 months ago around the time when she began having weakness of her left foot. She also has numbness around her left toe. This has not substantially improved with treatment of her rheumatoid arthritis. She started Enbrel for her rheumatoid arthritis without improvement, and  is now switched to Humira.  She has had back pain for many years, but it has been worsening over time. It is made worse by standing and by doing steps. She reports aching in her back that is as bad as 5 out of 10. Sometimes Tylenol and etodolac help her. Her pain is worst in the evenings. She also has significant pain with bending over. She reports some pain in the left hip area. Mostly her back pain is problematic for her.  Conservative measures:  Physical therapy: has not tried  Multimodal medical therapy including regular antiinflammatories: tylenol, etodolac Injections: has not tried epidural steroid injections  Past Surgery: denies  Phyllis Myers has no symptoms of cervical myelopathy.  The symptoms are causing a significant impact on the patient's life.   Review of Systems:  A 10 point review of systems is negative, except for the pertinent positives and negatives detailed in the HPI.  Past Medical History: Past Medical History:  Diagnosis Date   Acute rheumatic arthritis   Hypertension   Inflammatory arthritis   Past Surgical History: Past Surgical History:  Procedure Laterality Date   Left peroneal nerve decompression Left 12/14/2019  Dr Deetta Perla at [facility]    Allergies  Allergen Reactions   Amoxicillin     Muscle pain   Naproxen Sodium Rash    Heavy doses   No outpatient medications have been marked as taking for the 09/06/21 encounter [facility] Encounter).    Social History: Social History   Tobacco Use   Smoking status: Never Smoker   Smokeless tobacco:  Never Used  Vaping Use   Vaping Use: Never used  Substance Use Topics   Alcohol use: Yes  Comment: occasional   Drug use: Not Currently   Family Medical History: Family History  Problem Relation Age of Onset   High blood pressure (Hypertension) Mother   Coronary Artery Disease (Blocked arteries around heart) Mother   Migraines Mother   High blood pressure (Hypertension) Father   Myocardial  Infarction (Heart attack) Father   Physical Examination: See chart for vitals.  Heart sounds normal no MRG. Chest Clear to Auscultation Bilaterally.  General: Patient is well developed, well nourished, calm, collected, and in no apparent distress. Attention to examination is appropriate.  Psychiatric: Patient is non-anxious.  Head: Pupils equal, round, and reactive to light.  ENT: Oral mucosa appears well hydrated.  Neck: Supple. Full range of motion.  Respiratory: Patient is breathing without any difficulty.  Extremities: No edema.  Vascular: Palpable dorsal pedal pulses.  Skin: On exposed skin, there are no abnormal skin lesions.  NEUROLOGICAL:   Awake, alert, oriented to person, place, and time. Speech is clear and fluent. Fund of knowledge is appropriate.   Cranial Nerves: Pupils equal round and reactive to light. Facial tone is symmetric. Facial sensation is symmetric. Shoulder shrug is symmetric. Tongue protrusion is midline. There is no pronator drift.  ROM of spine: full. Palpation of spine: non tender.   Strength: Side Biceps Triceps Deltoid Interossei Grip Wrist Ext. Wrist Flex.  R 5 5 5 5 5 5 5   L 5 5 5 5 5 5 5    Side Iliopsoas Quads Hamstring PF DF EHL  R 5 5 5 5 5  4+  L 5 5 5 5  4+ 4+   Reflexes are 1+ and symmetric at the biceps, triceps, brachioradialis, patella and achilles. Hoffman's is absent. Clonus is not present. Toes are down-going.  Bilateral upper and lower extremity sensation is intact to light touch.  Gait is abnormal. No evidence of dysmetria noted.  Medical Decision Making  Imaging: MRI L spine 08/14/2019  IMPRESSION: 1. 5 mm anterolisthesis of L5 on S1 with associated disc bulge and severe bilateral facet arthrosis, resulting in moderate canal with bilateral subarticular stenosis, with mild to moderate left greater than right L5 foraminal narrowing. 2. Mild noncompressive disc bulging elsewhere within the lumbar spine without  significant stenosis or impingement.  Electronically Signed  By: M.D.  On: 08/14/2019 15:47  MR L spine 07/19/2021 Disc levels:   L1-L2:  Minimal disc bulge.  No canal or foraminal stenosis.   L2-L3:  Minimal disc bulge.  No canal or foraminal stenosis.   L3-L4:  Minimal disc bulge.  No canal or foraminal stenosis.   L4-L5:  Minimal disc bulge.  No canal or foraminal stenosis.   L5-S1: Anterolisthesis with uncovering of disc bulge. Facet  arthropathy with ligamentum flavum infolding and joint effusions.  Slightly increased moderate canal stenosis. Increased narrowing of  the subarticular recesses. Increased marked right foraminal  narrowing with exiting nerve root compression. Increased moderate  left foraminal narrowing with possible exiting nerve root  compression.   IMPRESSION:  Stable to slightly increased anterolisthesis at L5-S1. Disc bulge  and facet arthropathy at this level. There is increased canal,  subarticular, and foraminal stenosis as detailed above.   Electronically Signed    By: 08/16/2019 M.D.    On: 07/20/2021 20:22  I have personally reviewed the images and agree with the above interpretation.  EMG 11/09/2019 Impression: Complicated  study (compared to study in Nov 2020), 1) Chronic left S1 radiculopathy. 2) Bilateral fibular (peroneal) motor nerve abnormalities.      Thank you for the referral of this patient. It was our privilege to participate in care of your patient. Feel free to contact us with any further questions.     _____________________________ Jennings Books, M.D.  Flexion-extension x-rays show a 15 mm anterolisthesis of L5 on S1.  Assessment and Plan: Phyllis Myers is a pleasant 40 y.o. female with grade 1 anterolisthesis of L5 on S1. He has substantial facet joint effusions at L5-S1 bilaterally. She has 15 mm anterolisthesis on her x-rays and only 5 mm on her MRI scan, implying significant instability.  Her new  MRI shows worsening compression of the L5 nerve roots bilaterally. Her disc height continues to collapse and she has worsening foraminal stenosis at L5-S1.  She has weakness. She has failed conservative management. We will proceed with L5-S1 anterior lumbar interbody fusion with percutaneous fixation. Drs. Dew and Schnier will provide access.   Meade Maw MD, [facility] Department of Neurosurgery

## 2021-09-06 NOTE — Transfer of Care (Signed)
Immediate Anesthesia Transfer of Care Note  Patient: Phyllis Myers  Procedure(s) Performed: L5-S1 ANTERIOR LATERAL LUMBAR FUSION WITH PERCUTANEOUS SCREW ABDOMINAL EXPOSURE  Patient Location: PACU  Anesthesia Type:General  Level of Consciousness: awake, drowsy and patient cooperative  Airway & Oxygen Therapy: Patient Spontanous Breathing and Patient connected to face mask oxygen  Post-op Assessment: Report given to RN and Post -op Vital signs reviewed and stable  Post vital signs: Reviewed and stable  Last Vitals:  Vitals Value Taken Time  BP 122/75 09/06/21 1415  Temp    Pulse 128 09/06/21 1423  Resp 17 09/06/21 1424  SpO2 100 % 09/06/21 1423  Vitals shown include unvalidated device data.  Last Pain:  Vitals:   09/06/21 0801  TempSrc: Oral  PainSc: 3          Complications: No notable events documented.

## 2021-09-06 NOTE — Anesthesia Postprocedure Evaluation (Signed)
Anesthesia Post Note  Patient: Phyllis Myers  Procedure(s) Performed: L5-S1 ANTERIOR LATERAL LUMBAR FUSION WITH PERCUTANEOUS SCREW ABDOMINAL EXPOSURE  Patient location during evaluation: PACU Anesthesia Type: General Level of consciousness: awake and alert, oriented and patient cooperative Pain management: pain level controlled Vital Signs Assessment: post-procedure vital signs reviewed and stable Respiratory status: spontaneous breathing, nonlabored ventilation and respiratory function stable Cardiovascular status: blood pressure returned to baseline and stable Postop Assessment: adequate PO intake Anesthetic complications: yes Comments: Intraop blood loss requiring 2 units pRBCs with good response.   Encounter Notable Events  Notable Event Outcome Phase Comment  Bleeding requiring intervention Resolved in Lab Intraprocedure      Last Vitals:  Vitals:   09/06/21 1500 09/06/21 1515  BP: 116/88 118/83  Pulse:    Resp: (!) 22 17  Temp:    SpO2:  100%    Last Pain:  Vitals:   09/06/21 1515  TempSrc:   PainSc: 6                  Reed Breech

## 2021-09-06 NOTE — Anesthesia Postprocedure Evaluation (Deleted)
Anesthesia Post Note  Patient: AKEILA LANA  Procedure(s) Performed: L5-S1 ANTERIOR LATERAL LUMBAR FUSION WITH PERCUTANEOUS SCREW ABDOMINAL EXPOSURE  Patient location during evaluation: PACU Anesthesia Type: General Level of consciousness: awake and alert, oriented and patient cooperative Pain management: pain level controlled Vital Signs Assessment: post-procedure vital signs reviewed and stable Respiratory status: spontaneous breathing, nonlabored ventilation and respiratory function stable Cardiovascular status: blood pressure returned to baseline and stable Postop Assessment: adequate PO intake Anesthetic complications: no   No notable events documented.   Last Vitals:  Vitals:   09/06/21 1500 09/06/21 1515  BP: 116/88 118/83  Pulse:    Resp: (!) 22 17  Temp:    SpO2:  100%    Last Pain:  Vitals:   09/06/21 1515  TempSrc:   PainSc: 6                  Reed Breech

## 2021-09-06 NOTE — Op Note (Addendum)
Indications: Ms. Robins is a 40 yo female who presented with the diagnoses below.  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   Preoperative Note:   Risks of surgery discussed include: infection, bleeding, stroke, coma, death, paralysis, CSF leak, nerve/spinal cord injury, numbness, tingling, weakness, complex regional pain syndrome, recurrent stenosis and/or disc herniation, vascular injury, development of instability, neck/back pain, need for further surgery, persistent symptoms, development of deformity, and the risks of anesthesia. The patient understood these risks and agreed to proceed.  NAME OF ANTERIOR PROCEDURE:               1. Anterior lumbar interbody fusion via a left retroperitoneal approach at L5/S1 with Dr. Wyn Quaker providing apprach 2. Placement of a Lordotic Globus Monument 15 degree interbody cage, filled with Infuse   NAME OF POSTERIOR PROCEDURE: 1. Posterior instrumentation using Globus Creo Instrumentation 2. Posterolateral fusion, L5-S1      PROCEDURE:  Patient was brought to the operating room, intubated, then positioned in the supine position.  The belly was exposed and prepped and draped in standard fashion.  Dr. Wyn Quaker then scrubbed and to provide exposure to the anterior spine via retroperitoneal approach.  After Dr. Wyn Quaker obtained adequate exposure, we confirmed with a lateral x-ray.  I then scrubbed in to perform discectomy and anterior lumbar interbody fusion.  The L5-S1 disc base was incised with care to protect the retroperitoneal contents and the left iliac  vein.  The disc was then removed with a combination of curettes, pituitary graspers, and Kerrison punches.  After the majority the disc was removed, we introduced a trial spacer.  This was then checked on lateral fluoroscopy.  After this placed, another trial was placed.  We then completed disc prep until the endplates were adequate for bony fusion.  A rasp was then used to prepare the endplate.  At this point, a globus Monument interbody spacer was filled with infuse and introduced into the L5-S1 to space.  Using 2 screws, the spacer was attached to the S1 vertebral body.  Using 2 additional screws, the spacer was attached to the L5 vertebral body.  The locking mechanisms were engaged.  The reduction mechanism was then engaged and the L5-S1 anterolisthesis was fully reduced.  This confirmed on lateral fluoroscopy.  I then called Dr. Wyn Quaker to back into the operating room for closure.   Prior to final closure, an abdominal x-ray was taken to ensure there were no foreign objects left in the abdomen.    After closing the anterior part in layers, the patient was repositioned into prone position.  All pressure points were checked and double-checked and we brought in fluoroscopy to confirm our approach angles for putting in percutaneous pedicle screws.  The pedicles were marked using true AP flouroscopy, adjusting the angle at each level.  We then prepped and draped the patient in the standard fashion.  At this point, incisions were made for placing percutaneous pedicle screw instrumentation at L5-S!.  Starting at L5, a Jamsheedi needle was used to cannulate the pedicle bilaterally using AP flouroscopy. Direct stimulation was used on the needle without any low (<15 mAmp) stimulation thresholds. After cannulation of  the pedicle to 30 mm, a K-wire was placed through the Yale approximately and secured.   Using a similar technique, the pedicles at L5-S1 were cannulated and K wires secured. The K wires were then  checked using lateral flouroscopy to ensure placement into the vertebral bodies. After confirming placement of K wires, cannulated pedicle screws were introduced over the K wires at each level.  After advancing each screw into the vertebral body approximately 25-30 mm, the K wire was removed.At each level, 6.5x55mm Globus Creo pedicle screws were placed under lateral flouroscopy. Once the screws were placed, the screw extensions were then linked, a path was formed for the rod and a rod was utilized to connect the screws.  We then compressed, torqued / counter-torqued and removed the screw assembly. Once performed on each side, confirmatory AP and lateral x-rays were taken and the case was completed.  Arthrodesis at L5-S1 was performed after preparation of the posterolateral elements..     Again we confirmed radiographically and began our closure.  The wound was closed using 0 Vicryl interrupted suture in the fascia, 2-0 Vicryl inverted suture were placed in the subcutaneous tissue and dermis. 3-0 monocryl was used for final closure. Dermabond was used to close the skin.    Needle, lap and all counts were correct at the end of the case.    There was no pathologic change in the neuromonitoring during the procedure.   Manning Charity PA assisted in the entire procedure.  [name] MD Neurosurgery

## 2021-09-06 NOTE — Op Note (Signed)
[facility]     OPERATIVE NOTE   DATE: 09/06/2021   PRE-OPERATIVE DIAGNOSIS: Lumbar adjacent segment disease with spondylolisthesis   POST-OPERATIVE DIAGNOSIS: same as above   PROCEDURE: 1.   Anterior lumbar interbody fusion exposure via left retroperitoneal approach at L5-S1   SURGEON: Festus Barren MD   ASSISTANT(S): none   ANESTHESIA: general   ESTIMATED BLOOD LOSS: 1500 cc   FINDING(S): 1.  none     INDICATIONS:   Patient presents with need for an anterior spinal L5-S1 interbody fusion.  We are performing the exposure for neurosurgery.  Risks and benefits were discussed and informed consent was obtained.   DESCRIPTION: After obtaining full informed written consent, the patient was brought back to the operating room and placed supine upon the operating table.  The patient was prepped and draped in the standard fashion. A left paramedian incision was created in the lower abdomen.  Care was taken to stay between the rectus and the obliques.  The fascia was divided in this location.   We continued to dissect down to exposing the external iliac artery and vein.  The exposure had to be carried superiorly up to the common iliac artery and vein.  Ureter was identified and protected from harm.  Several small venous branches were ligated and divided between silk ties as needed.  We identified the internal iliac artery and vein as well.  We continued the dissection to the proximal common iliac artery and vein and tediously retracted the artery and vein medially.  In dissecting out the iliac veins, rents at the iliac confluence of the internal iliac vein and the external iliac vein resulted in several holes with need for 5-0 Prolene suture repair including pledgeted repair.  This was tedious due to the depth of the hole and the poor visualization initially, and there was some blood loss encountered.  Ultimately, we were able to gain control with pressure and suture ligation of  the rinse with the vein not being compressed or narrowed in this location.  Following this, we continued the dissection medially down to the spine and were able to get a good length of the spine available for Dr. Myer Haff.  This exposed the L5-S1 interbody.  The Omnitract retractor was used as were renal vein retractors and the splanchnic and body wall retractors and the L5-S1 interbody was exposed to an adequate fashion for fusion by Dr. Myer Haff. After Dr. Myer Haff completed his portion of the procedure, we proceeded with closure.  The deep fascia was closed with a running layer of 2-0 Vicryl.  A more superficial layer was then closed with a running layer of 2-0 Vicryl in the subcutaneous tissue was closed with 3-0 Vicryl.  Skin was closed with 4-0 Monocryl.  Dermabond and Sterile dressing was placed. At this point, we turned the case back over to Dr. Marcell Barlow for the posterior portion of the procedure.   COMPLICATIONS: None   CONDITION: Stable   Festus Barren   07/27/2020, 3:29 PM       This note was created with Dragon Medical transcription system. Any errors in dictation are purely unintentional.

## 2021-09-06 NOTE — Anesthesia Preprocedure Evaluation (Addendum)
Anesthesia Evaluation  Patient identified by MRN, date of birth, ID band Patient awake    Reviewed: Allergy & Precautions, NPO status , Patient's Chart, lab work & pertinent test results  History of Anesthesia Complications Negative for: history of anesthetic complications  Airway Mallampati: I   Neck ROM: Full    Dental no notable dental hx.    Pulmonary neg pulmonary ROS,    Pulmonary exam normal breath sounds clear to auscultation       Cardiovascular hypertension, Normal cardiovascular exam Rhythm:Regular Rate:Normal  ECG 08/23/21:  Normal sinus rhythm with sinus arrhythmia Normal ECG   Neuro/Psych PSYCHIATRIC DISORDERS Anxiety Depression Chronic back pain    GI/Hepatic GERD  ,  Endo/Other  negative endocrine ROS  Renal/GU negative Renal ROS     Musculoskeletal  (+) Arthritis , Rheumatoid disorders,    Abdominal   Peds  Hematology negative hematology ROS (+)   Anesthesia Other Findings   Reproductive/Obstetrics                            Anesthesia Physical Anesthesia Plan  ASA: 2  Anesthesia Plan: General   Post-op Pain Management:    Induction: Intravenous  PONV Risk Score and Plan: 3 and Ondansetron, Dexamethasone and Treatment may vary due to age or medical condition  Airway Management Planned: Oral ETT  Additional Equipment:   Intra-op Plan:   Post-operative Plan: Extubation in OR  Informed Consent: I have reviewed the patients History and Physical, chart, labs and discussed the procedure including the risks, benefits and alternatives for the proposed anesthesia with the patient or authorized representative who has indicated his/her understanding and acceptance.     Dental advisory given  Plan Discussed with: CRNA  Anesthesia Plan Comments: (Patient consented for risks of anesthesia including but not limited to:  - adverse reactions to medications - damage  to eyes, teeth, lips or other oral mucosa - nerve damage due to positioning  - sore throat or hoarseness - damage to heart, brain, nerves, lungs, other parts of body or loss of life  Informed patient about role of CRNA in peri- and intra-operative care.  Patient voiced understanding.)        Anesthesia Quick Evaluation

## 2021-09-07 DIAGNOSIS — M4310 Spondylolisthesis, site unspecified: Secondary | ICD-10-CM

## 2021-09-07 LAB — BPAM RBC
Blood Product Expiration Date: 202212282359
Blood Product Expiration Date: 202212292359
Blood Product Expiration Date: 202212292359
Blood Product Expiration Date: 202212292359
ISSUE DATE / TIME: 202212071046
ISSUE DATE / TIME: 202212071046
Unit Type and Rh: 6200
Unit Type and Rh: 6200
Unit Type and Rh: 6200
Unit Type and Rh: 6200

## 2021-09-07 LAB — CBC
HCT: 28.3 % — ABNORMAL LOW (ref 36.0–46.0)
Hemoglobin: 10 g/dL — ABNORMAL LOW (ref 12.0–15.0)
MCH: 34.5 pg — ABNORMAL HIGH (ref 26.0–34.0)
MCHC: 35.3 g/dL (ref 30.0–36.0)
MCV: 97.6 fL (ref 80.0–100.0)
Platelets: 131 10*3/uL — ABNORMAL LOW (ref 150–400)
RBC: 2.9 MIL/uL — ABNORMAL LOW (ref 3.87–5.11)
RDW: 13.6 % (ref 11.5–15.5)
WBC: 11.2 10*3/uL — ABNORMAL HIGH (ref 4.0–10.5)
nRBC: 0 % (ref 0.0–0.2)

## 2021-09-07 LAB — TYPE AND SCREEN
ABO/RH(D): A POS
Antibody Screen: NEGATIVE
Unit division: 0
Unit division: 0
Unit division: 0
Unit division: 0

## 2021-09-07 LAB — PREPARE RBC (CROSSMATCH)

## 2021-09-07 MED ORDER — DIPHENHYDRAMINE HCL 50 MG/ML IJ SOLN: 12.5000 mg | Freq: Four times a day (QID) | INTRAMUSCULAR | Status: DC | PRN

## 2021-09-07 MED ORDER — OXYCODONE HCL 5 MG PO TABS: 5.0000 mg | ORAL_TABLET | ORAL | Status: DC | PRN

## 2021-09-07 MED ORDER — CHLORHEXIDINE GLUCONATE CLOTH 2 % EX PADS
6.0000 | MEDICATED_PAD | Freq: Every day | CUTANEOUS | Status: DC
  Administered 2021-09-07 – 2021-09-09 (×3): 6 via TOPICAL

## 2021-09-07 MED ORDER — KETOROLAC TROMETHAMINE 30 MG/ML IJ SOLN
30.0000 mg | Freq: Once | INTRAMUSCULAR | Status: AC
  Administered 2021-09-07: 30 mg via INTRAVENOUS
  Filled 2021-09-07: qty 1

## 2021-09-07 MED ORDER — OXYCODONE HCL 5 MG PO TABS
10.0000 mg | ORAL_TABLET | ORAL | Status: DC | PRN
  Administered 2021-09-07 – 2021-09-09 (×14): 10 mg via ORAL
  Filled 2021-09-07 (×14): qty 2

## 2021-09-07 MED ORDER — DIPHENHYDRAMINE HCL 25 MG PO CAPS: 25.0000 mg | ORAL_CAPSULE | Freq: Four times a day (QID) | ORAL | Status: DC | PRN

## 2021-09-07 MED ORDER — KETOROLAC TROMETHAMINE 15 MG/ML IJ SOLN
15.0000 mg | Freq: Four times a day (QID) | INTRAMUSCULAR | Status: AC
  Administered 2021-09-07 – 2021-09-09 (×8): 15 mg via INTRAVENOUS
  Filled 2021-09-07 (×8): qty 1

## 2021-09-07 NOTE — Progress Notes (Signed)
Met with the patient to discuss DC plan and needs She will be staying with her aunt [facility] I called to provide referral and ask them to set up appointment, their first available appointment is 12/29 at 38, I notified the patient She needs a RW and a 3 in 1 I notified Rhonda at [employer], it will be delivered to her room prior to DC She has transportation and can afford her medication

## 2021-09-07 NOTE — Plan of Care (Signed)

## 2021-09-07 NOTE — Progress Notes (Signed)
[facility] Daily Progress Note  09/06/21: 1.   Anterior lumbar interbody fusion exposure via left retroperitoneal approach at L5-S1  Subjective: Patient with abdominal / back incisional pain. No acute issues overnight.   Objective: Vitals:   09/06/21 2344 09/07/21 0443 09/07/21 0829 09/07/21 1212  BP: 107/68 112/73 109/64 106/68  Pulse: 83 82 87 71  Resp: 20 20 18 18   Temp: 98.8 F (37.1 C) 98.7 F (37.1 C) 98.2 F (36.8 C) 97.8 F (36.6 C)  TempSrc: Oral Oral Oral   SpO2: 99% 98% 100% 99%  Weight:      Height:        Intake/Output Summary (Last 24 hours) at 09/07/2021 1227 Last data filed at 09/07/2021 0949 Gross per 24 hour  Intake 1540 ml  Output 3850 ml  Net -2310 ml   Physical Exam: A&Ox3, NAD CV: RRR Pulmonary: CTA Bilaterally Abdomen: Soft, Nontender, Nondistended  Incision: Clean, dry and intact. No drainage on incision.  Vascular: Warm distally to toes.    Laboratory: CBC    Component Value Date/Time   WBC 11.2 (H) 09/07/2021 0435   HGB 10.0 (L) 09/07/2021 0435   HGB 13.0 10/23/2012 2040   HCT 28.3 (L) 09/07/2021 0435   HCT 39.2 10/23/2012 2040   PLT 131 (L) 09/07/2021 0435   PLT 233 10/23/2012 2040   BMET    Component Value Date/Time   NA 138 09/06/2021 1250   NA 137 10/23/2012 2040   K 4.7 09/06/2021 1250   K 3.3 (L) 10/23/2012 2040   CL 108 09/06/2021 1250   CL 105 10/23/2012 2040   CO2 23 09/06/2021 1250   CO2 22 10/23/2012 2040   GLUCOSE 209 (H) 09/06/2021 1250   GLUCOSE 127 (H) 10/23/2012 2040   BUN <5 (L) 09/06/2021 1250   BUN 14 10/23/2012 2040   CREATININE 0.55 09/06/2021 1250   CREATININE 0.64 10/23/2012 2040   CALCIUM 7.7 (L) 09/06/2021 1250   CALCIUM 8.8 10/23/2012 2040   GFRNONAA >60 09/06/2021 1250   GFRNONAA >60 10/23/2012 2040   GFRAA >60 12/11/2019 0943   GFRAA >60 10/23/2012 2040   Assessment/Planning: The patient is s/p anterior lumbar interbody fusion exposure via left retroperitoneal  approach at L5-S1 - POD#1  1) Encouraged abdominal support in order to take deep breaths as pain from abdominal incision can hinder this. 2) OK from vascular standpoint to remove dressing and shower as of Saturday. Gently clean incision with soap and water. Gently pat dry.  3) Post-op care as per neurosurgery.  Discussed with Dr. Saturday Rebeccah Ivins PA-C 09/07/2021 12:27 PM

## 2021-09-07 NOTE — Progress Notes (Signed)
    Attending Progress Note  History: BREE HEINZELMAN is s/p L5-S1 ALIF, L5-S1 posterolateral fusion on 09/06/21  POD#1: Pt complaining low back pain this morning.   Physical Exam: Vitals:   09/07/21 0443 09/07/21 0829  BP: 112/73 109/64  Pulse: 82 87  Resp: 20 18  Temp: 98.7 F (37.1 C) 98.2 F (36.8 C)  SpO2: 98% 100%    AA Ox3 CNI Strength:5/5 throughout BLE  No obvious leg swelling good pedal pulses.   Data:  Recent Labs  Lab 09/06/21 1250  NA 138  K 4.7  CL 108  CO2 23  BUN <5*  CREATININE 0.55  GLUCOSE 209*  CALCIUM 7.7*   No results for input(s): AST, ALT, ALKPHOS in the last 168 hours.  Invalid input(s): TBILI   Recent Labs  Lab 09/06/21 1250 09/07/21 0435  WBC 9.4 11.2*  HGB 11.7* 10.0*  HCT 32.4* 28.3*  PLT 151 131*   No results for input(s): APTT, INR in the last 168 hours.       Other tests/results: hemoglobin 10 from 11.7 yesterday   Assessment/Plan:  LORRIANE DEHART is a pleasant 40 y.o presenting with back and right leg pain s/p L5-S1 ALIF and PSF.  - mobilize - pain control - DVT prophylaxis; started lovenox on 09/06/21  - PTOT - will keep foley in place this morning. If she mobilizes well with PT can d/c this afternoon  - continue to monitor BP   Manning Charity PA-C Department of Neurosurgery

## 2021-09-07 NOTE — Progress Notes (Signed)
Physical Therapy Treatment Patient Details Name: Phyllis Myers MRN: [id number] DOB: May 20, 1981 Today's Date: 09/07/2021   History of Present Illness Phyllis Myers is a 40yoF who comes to [facility] on 12/7 for scheduled L5-S1 ALIF, L5-S1 posterolateral fusion with Dr. Myer Haff.    PT Comments    Pt in bed upon arrival, aunt visiting. Discussed independent strategies for managign chronic hip stiffness in the setting of RA and being acutely off her regular meds for surgery. Pt reports more stillness in knees/hips and other joints (hands) particularly due to being in bed extensively last 2 days. Handout provided and discussed in detail. Pt not up for doing stairs training at this time, reports to feel fairly fatigued at present,but agreeable to work on this tomorrow.     Recommendations for follow up therapy are one component of a multi-disciplinary discharge planning process, led by the attending physician.  Recommendations may be updated based on patient status, additional functional criteria and insurance authorization.  Follow Up Recommendations  Follow physician's recommendations for discharge plan and follow up therapies     Assistance Recommended at Discharge Set up Supervision/Assistance  Equipment Recommendations  Rolling walker (2 wheels);BSC/3in1    Recommendations for Other Services       Precautions / Restrictions Precautions Precautions: Back Precaution Booklet Issued: Yes (comment) Precaution Comments: BAT precautions Restrictions Weight Bearing Restrictions: No     Mobility  Bed Mobility Overal bed mobility: Needs Assistance Bed Mobility: Supine to Sit     Supine to sit: Supervision     General bed mobility comments: pt uses BUE to bring self from recumbent to short sitting per preference; discussed earlier, advised against    Transfers Overall transfer level: Needs assistance Equipment used: Rolling walker (2 wheels) Transfers: Sit to/from Stand Sit to  Stand: Supervision           General transfer comment: no cues needed, performs well with RW; Chartered loss adjuster dons pt's slippers prior to transfer    Ambulation/Gait Ambulation/Gait assistance: Supervision Gait Distance (Feet): 240 Feet Assistive device: Rolling walker (2 wheels) Gait Pattern/deviations: WFL(Within Functional Limits) Gait velocity: 0.26m/s         Stairs Stairs:  (deferred to later due to rehab gym unavailable.)           Wheelchair Mobility    Modified Rankin (Stroke Patients Only)       Balance Overall balance assessment: No apparent balance deficits (not formally assessed)                                          Cognition Arousal/Alertness: Awake/alert Behavior During Therapy: WFL for tasks assessed/performed Overall Cognitive Status: Within Functional Limits for tasks assessed                                          Exercises General Exercises - Lower Extremity Ankle Circles/Pumps: Both;5 reps;Limitations;Supine Ankle Circles/Pumps Limitations: education on self ROM with handout Quad Sets: AROM;Both;5 reps;Supine;Limitations Quad Sets Limitations: education on self ROM with handout Gluteal Sets: AROM;5 reps;Both;Supine;Limitations Gluteal Sets Limitations: education on self ROM with handout; advised against prone SLR Heel Slides: AROM;Both;5 reps;Supine Straight Leg Raises: AROM;Supine;Both;Limitations Straight Leg Raises Limitations: discussed, demonstrated, not performed in session.    General Comments        Pertinent Vitals/Pain  Pain Assessment: 0-10 Pain Score: 5  Pain Location: low back and front; shooting left groin to knee; Rt radicular pain improved since surgery.    Home Living Family/patient expects to be discharged to:: Private residence Living Arrangements: Alone (plans to DC to aunt's house) Available Help at Discharge: Family;Available 24 hours/day Type of Home: House Home Access:  Stairs to enter Entrance Stairs-Rails: Right;Left;Can reach both Entrance Stairs-Number of Steps: 3   Home Layout: One level Home Equipment: None      Prior Function            PT Goals (current goals can now be found in the care plan section) Acute Rehab PT Goals Patient Stated Goal: have better tolerated mobility for IADL than PTA PT Goal Formulation: With patient Time For Goal Achievement: 09/21/21 Potential to Achieve Goals: Good Progress towards PT goals: Progressing toward goals    Frequency    7X/week      PT Plan Current plan remains appropriate    Co-evaluation              AM-PAC PT "6 Clicks" Mobility   Outcome Measure  Help needed turning from your back to your side while in a flat bed without using bedrails?: A Little Help needed moving from lying on your back to sitting on the side of a flat bed without using bedrails?: A Little Help needed moving to and from a bed to a chair (including a wheelchair)?: A Little Help needed standing up from a chair using your arms (e.g., wheelchair or bedside chair)?: A Little Help needed to walk in hospital room?: A Little Help needed climbing 3-5 steps with a railing? : A Little 6 Click Score: 18    End of Session Equipment Utilized During Treatment: Gait belt Activity Tolerance: Patient tolerated treatment well Patient left: in bed;with family/visitor present;with bed alarm set;with call bell/phone within reach Nurse Communication: Mobility status PT Visit Diagnosis: Difficulty in walking, not elsewhere classified (R26.2);Other abnormalities of gait and mobility (R26.89)     Time: 9702-6378 PT Time Calculation (min) (ACUTE ONLY): 12 min  Charges:  $Gait Training: 8-22 mins $Self Care/Home Management: 8-22                    2:53 PM, 09/07/21 Rosamaria Lints, PT, DPT Physical Therapist - [facility]  [phone] (ASCOM)     Harper Smoker C 09/07/2021, 2:51 PM

## 2021-09-07 NOTE — Progress Notes (Signed)
OT Cancellation Note  Patient Details Name: Phyllis Myers MRN: [id number] DOB: July 11, 1981   Cancelled Treatment:    Reason Eval/Treat Not Completed: Other (comment). Upon attempt, pt reporting significant low back pain and just took pain meds. Pt requesting OT return this afternoon at 3pm when her aunt will be here (who she plans to stay with temporarily after discharge). Will plan to return at 3pm for OT evaluation.  Arman Filter., MPH, MS, OTR/L ascom [phone] 09/07/21, 11:46 AM

## 2021-09-07 NOTE — Evaluation (Signed)
hysical Therapy Evaluation Patient Details Name: Phyllis Myers MRN: [id number] DOB: 1981-05-09 Today's Date: 09/07/2021  History of Present Illness  [name] is a 40yoF who comes to [facility] on 12/7 for scheduled L5-S1 ALIF, L5-S1 posterolateral fusion with Dr. Myer Haff.  Clinical Impression  Pt admitted with above diagnosis. Pt currently with functional limitations due to the deficits listed below (see "PT Problem List"). Upon entry, pt in bed, awake and agreeable to participate. The pt is alert, pleasant, interactive, and able to provide info regarding prior level of function, both in tolerance and independence. Educated on BAT precautions and log roll technique, supervision for rolling, standing, walking. No cues needed for RW use of low seated surfaces. Pt AMB around unit, using RW for pain control. Pt left up in recliner sitting erect, feet supported on falls mat. Wil perform stairs training later in day as able. Patient's performance this date reveals decreased ability, independence, and tolerance in performing all basic mobility required for performance of activities of daily living. Pt requires additional DME, close physical assistance, and cues for safe participate in mobility. Pt will benefit from skilled PT intervention to increase independence and safety with basic mobility in preparation for discharge to the venue listed below.          Recommendations for follow up therapy are one component of a multi-disciplinary discharge planning process, led by the attending physician.  Recommendations may be updated based on patient status, additional functional criteria and insurance authorization.  Follow Up Recommendations Follow physician's recommendations for discharge plan and follow up therapies (no immediate need, could revisit in 2-3 weeks.)    Assistance Recommended at Discharge Set up Supervision/Assistance  Functional Status Assessment Patient has had a recent decline in their  functional status and demonstrates the ability to make significant improvements in function in a reasonable and predictable amount of time.  Equipment Recommendations  Rolling walker (2 wheels);BSC/3in1    Recommendations for Other Services       Precautions / Restrictions Precautions Precautions: Back Precaution Booklet Issued: Yes (comment) Precaution Comments: BAT precautions Restrictions Weight Bearing Restrictions: No      Mobility  Bed Mobility Overal bed mobility: Needs Assistance Bed Mobility: Rolling     Supine to sit: Supervision     General bed mobility comments: 1-step cues for log roll, no assist needed    Transfers Overall transfer level: Needs assistance Equipment used: Rolling walker (2 wheels) Transfers: Sit to/from Stand Sit to Stand: Supervision           General transfer comment: no cues needed, performs well with RW; Chartered loss adjuster dons pt's slippers prior to transfer    Ambulation/Gait Ambulation/Gait assistance: Supervision Gait Distance (Feet): 240 Feet Assistive device: Rolling walker (2 wheels) Gait Pattern/deviations: WFL(Within Functional Limits) Gait velocity: 0.63m/s        Stairs Stairs:  (deferred to later due to rehab gym unavailable.)          Wheelchair Mobility    Modified Rankin (Stroke Patients Only)       Balance Overall balance assessment: No apparent balance deficits (not formally assessed)                                           Pertinent Vitals/Pain Pain Assessment: 0-10 Pain Score: 5  Pain Location: low back and front; shooting left groin to knee; Rt radicular pain improved since surgery.  Home Living Family/patient expects to be discharged to:: Private residence Living Arrangements: Alone (plans to DC to aunt's house) Available Help at Discharge: Family;Available 24 hours/day Type of Home: House Home Access: Stairs to enter Entrance Stairs-Rails: Right;Left;Can reach  both Entrance Stairs-Number of Steps: 3   Home Layout: One level Home Equipment: None      Prior Function Prior Level of Function : Independent/Modified Independent                     Hand Dominance        Extremity/Trunk Assessment                Communication      Cognition Arousal/Alertness: Awake/alert Behavior During Therapy: WFL for tasks assessed/performed Overall Cognitive Status: Within Functional Limits for tasks assessed                                          General Comments      Exercises     Assessment/Plan    PT Assessment Patient needs continued PT services  PT Problem List Decreased strength;Decreased activity tolerance;Decreased range of motion;Decreased balance;Decreased mobility       PT Treatment Interventions DME instruction;Gait training;Stair training;Functional mobility training;Therapeutic activities;Therapeutic exercise;Patient/family education    PT Goals (Current goals can be found in the Care Plan section)  Acute Rehab PT Goals Patient Stated Goal: have better tolerated mobility for IADL than PTA PT Goal Formulation: With patient Time For Goal Achievement: 09/21/21 Potential to Achieve Goals: Good    Frequency 7X/week   Barriers to discharge        Co-evaluation               AM-PAC PT "6 Clicks" Mobility  Outcome Measure Help needed turning from your back to your side while in a flat bed without using bedrails?: A Little Help needed moving from lying on your back to sitting on the side of a flat bed without using bedrails?: A Little Help needed moving to and from a bed to a chair (including a wheelchair)?: A Little Help needed standing up from a chair using your arms (e.g., wheelchair or bedside chair)?: A Little Help needed to walk in hospital room?: A Little Help needed climbing 3-5 steps with a railing? : A Little 6 Click Score: 18    End of Session Equipment Utilized During  Treatment: Gait belt Activity Tolerance: Patient tolerated treatment well;No increased pain Patient left: in chair;with call bell/phone within reach Nurse Communication: Mobility status PT Visit Diagnosis: Difficulty in walking, not elsewhere classified (R26.2);Other abnormalities of gait and mobility (R26.89)    Time: 2774-1287 PT Time Calculation (min) (ACUTE ONLY): 39 min   Charges:   PT Evaluation $PT Eval Moderate Complexity: 1 Mod PT Treatments $Gait Training: 8-22 mins       12:22 PM, 09/07/21 Rosamaria Lints, PT, DPT Physical Therapist - [facility]  [phone] (ASCOM)    Rafiq Bucklin C 09/07/2021, 12:19 PM

## 2021-09-07 NOTE — Evaluation (Signed)
Occupational Therapy Evaluation Patient Details Name: Phyllis Myers MRN: [id number] DOB: 01/17/1981 Today's Date: 09/07/2021   History of Present Illness Phyllis Myers is a 40yoF who comes [facility] on 12/[facility] for scheduled L5-S1 ALIF, L5-S1 posterolateral fusion with Dr. Myer Haff.   Clinical Impression   Pt seen for OT evaluation this date, POD#1 from lumbar surgery above. Prior to hospital admission, pt was independent with mobility, ADL, and IADL. Pt lives alone with her small dog and plans to stay with her aunt initially after discharge. Aunt's home is level entry, 1 story, with stall shower. Currently pt requires MIN-MOD A for LB ADL tasks while maintaining back precautions and Supv/CGA for ADL mobility with VC for precautions. Pt and aunt who was present educated in back precautions and how to maintain during ADL/mobility, self care skills, bed mobility and functional transfer training including car transfers, back brace mgt, pet care considerations, AE/DME for bathing, dressing, and toileting needs, and home/routines modifications and falls prevention strategies to maximize safety and functional independence while minimizing falls risk and maintaining precautions. Pt/aunt verbalized understanding of all education/training provided. Handout provided to support recall and carry over of learned precautions/techniques for bed mobility, functional transfers, and self care skills. Pt will benefit from additional skilled OT services while hospitalized to maximize recall and carryover of learned precautions and strategies in order to maximize safe recovery and return to independence.    Recommendations for follow up therapy are one component of a multi-disciplinary discharge planning process, led by the attending physician.  Recommendations may be updated based on patient status, additional functional criteria and insurance authorization.   Follow Up Recommendations  No OT follow up    Assistance  Recommended at Discharge PRN  Functional Status Assessment  Patient has had a recent decline in their functional status and demonstrates the ability to make significant improvements in function in a reasonable and predictable amount of time.  Equipment Recommendations  BSC/3in1;Other (comment) (LH sponge, handheld shower head at aunt's house)    Recommendations for Other Services       Precautions / Restrictions Precautions Precautions: Back Precaution Booklet Issued: Yes (comment) Precaution Comments: no bending, lifting, twisting, or arching Required Braces or Orthoses: Spinal Brace Spinal Brace: Lumbar corset;Applied in sitting position Restrictions Weight Bearing Restrictions: No      Mobility Bed Mobility      General bed mobility comments: pt instructed in log roll but pt declined to trial    Transfers            General transfer comment: pt declined      Balance                                          ADL either performed or assessed with clinical judgement   ADL Overall ADL's : Needs assistance/impaired                                       General ADL Comments: Pt currently requires MIN-MOD A for LB ADL tasks, improved with AE to maintain back precautions. Anticipate Sup-CGA for ADL transfers.     Vision         Perception     Praxis      Pertinent Vitals/Pain Pain Assessment: 0-10 Pain Score: 4  Pain Location:  low back Pain Descriptors / Indicators: Aching Pain Intervention(s): Limited activity within patient's tolerance;Monitored during session;Premedicated before session     Hand Dominance     Extremity/Trunk Assessment Upper Extremity Assessment Upper Extremity Assessment: Overall WFL for tasks assessed   Lower Extremity Assessment Lower Extremity Assessment: Overall WFL for tasks assessed   Cervical / Trunk Assessment Cervical / Trunk Assessment: Back Surgery   Communication     Cognition  Arousal/Alertness: Awake/alert Behavior During Therapy: WFL for tasks assessed/performed Overall Cognitive Status: Within Functional Limits for tasks assessed                                       General Comments       Exercises Other Exercises: Pt and aunt instructed in home/routines modifications, back brace mgt, AE/DME for ADL, back precautions and how to maintain during ADL mobility, grooming, showering, LB dressing, and ADL transfers, car transfers, and falls prevention strategies. Handout provided. Other Exercises: Original nursing order indicated no back brace needed. Secure chat with medical team and surgeon clarified pt is to wear back brace (already has) OOB.   Shoulder Instructions      Home Living Family/patient expects to be discharged to:: Private residence Living Arrangements: Alone (plans to DC to aunt's house) Available Help at Discharge: Family;Available 24 hours/day Type of Home: House Home Access: Stairs to enter [employer] of Steps: 3 Entrance Stairs-Rails: Right;Left;Can reach both Home Layout: One level     Bathroom Shower/Tub: Chief Strategy Officer: Standard     Home Equipment: None;Hand held shower head;Adaptive equipment Adaptive Equipment: Reacher Additional Comments: Aunt's home has small stall shower, adjustable bed with bed rail available      Prior Functioning/Environment Prior Level of Function : Independent/Modified Independent                        OT Problem List: Pain;Decreased range of motion;Decreased knowledge of use of DME or AE;Decreased knowledge of precautions      OT Treatment/Interventions: Self-care/ADL training;Therapeutic exercise;Therapeutic activities;DME and/or AE instruction;Patient/family education;Balance training    OT Goals(Current goals can be found in the care plan section) Acute Rehab OT Goals Patient Stated Goal: go home and recover, get back to work OT Goal  Formulation: With patient/family Time For Goal Achievement: 09/21/21 Potential to Achieve Goals: Good ADL Goals Pt Will Perform Lower Body Dressing: with modified independence;with adaptive equipment;sit to/from stand (maintaining back precautions) Pt Will Transfer to Toilet: with modified independence;ambulating (BSC over toilet, LRAD PRN, maintaining back precautions) Additional ADL Goal #1: Pt will independently perform standing grooming tasks and seated showering tasks with AE PRN, maintaining back precautions. Additional ADL Goal #2: Pt will perform bed mobility with modified independence, maintaining back precautions.  OT Frequency: Min 2X/week   Barriers to D/C:            Co-evaluation              AM-PAC OT "6 Clicks" Daily Activity     Outcome Measure Help from another person eating meals?: None Help from another person taking care of personal grooming?: None Help from another person toileting, which includes using toliet, bedpan, or urinal?: A Little Help from another person bathing (including washing, rinsing, drying)?: A Little Help from another person to put on and taking off regular upper body clothing?: None Help from another person to put  on and taking off regular lower body clothing?: A Little 6 Click Score: 21   End of Session    Activity Tolerance: Patient tolerated treatment well Patient left: in bed;with call bell/phone within reach;with bed alarm set;with family/visitor present  OT Visit Diagnosis: Other abnormalities of gait and mobility (R26.89);Pain Pain - Right/Left:  (low back)                Time: 0786-7544 OT Time Calculation (min): 40 min Charges:  OT General Charges $OT Visit: 1 Visit OT Evaluation $OT Eval Moderate Complexity: 1 Mod OT Treatments $Self Care/Home Management : 23-37 mins  Arman Filter., MPH, MS, OTR/L ascom [phone] 09/07/21, 4:08 PM

## 2021-09-08 ENCOUNTER — Encounter: Payer: Self-pay | Admitting: Neurosurgery

## 2021-09-08 LAB — CBC
HCT: 25.2 % — ABNORMAL LOW (ref 36.0–46.0)
Hemoglobin: 9 g/dL — ABNORMAL LOW (ref 12.0–15.0)
MCH: 35 pg — ABNORMAL HIGH (ref 26.0–34.0)
MCHC: 35.7 g/dL (ref 30.0–36.0)
MCV: 98.1 fL (ref 80.0–100.0)
Platelets: 122 10*3/uL — ABNORMAL LOW (ref 150–400)
RBC: 2.57 MIL/uL — ABNORMAL LOW (ref 3.87–5.11)
RDW: 13.7 % (ref 11.5–15.5)
WBC: 8.8 10*3/uL (ref 4.0–10.5)
nRBC: 0 % (ref 0.0–0.2)

## 2021-09-08 NOTE — Plan of Care (Signed)

## 2021-09-08 NOTE — Progress Notes (Signed)
Physical Therapy Treatment Patient Details Name: Phyllis Myers MRN: [id number] DOB: 30-Sep-1981 Today's Date: 09/08/2021   History of Present Illness Phyllis Myers is a 40yoF who comes to [facility] on 12/7 for scheduled L5-S1 ALIF, L5-S1 posterolateral fusion with Dr. Myer Haff.    PT Comments    Pt agreeable to session s/p OT session. Pt educated on recent Hb: trend and common symptoms of ABLA to self monitor with upright activity and exertion, both here as well as post DC. Last 4 days Hb: 14-> 11.7-> 10.0-> 9.0. Pt has no lightheadedness, palpitations, tachycardia, or increased RR/dyspnea at any time during session. ModI transfers and AMB, education again donning brace and recommended use. Pt completed stairs training with subsequent confidence. Pt complains of worse Left anterior thigh pain from inguinal crease down to just proximal the knee joint, pain worse with attempted seated hip flexion, but able to perform with assistance from author with less pain. Pt otherwise has sustained motor activity at knees and ankles as screened this session, LT sensation intact and consistent with previous recent days. Author retested active hip flexion in a less adducted hip angle to move away from any potential femoroacetabular aggravation, but symptoms are actually increased when attempted with knee aligned with axilla. Pt has completed all basic mobility training required for home DC, DME for DC in room at visit.      Recommendations for follow up therapy are one component of a multi-disciplinary discharge planning process, led by the attending physician.  Recommendations may be updated based on patient status, additional functional criteria and insurance authorization.  Follow Up Recommendations  Follow physician's recommendations for discharge plan and follow up therapies (will plan on scheduling OPPT with [facility] as she did prehab)     Assistance Recommended at Discharge Set up Supervision/Assistance   Equipment Recommendations  Rolling walker (2 wheels);BSC/3in1    Recommendations for Other Services       Precautions / Restrictions Precautions Precautions: Back Precaution Booklet Issued: Yes (comment) Precaution Comments: BAT precautions Required Braces or Orthoses: Spinal Brace Spinal Brace: Lumbar corset;Applied in sitting position Restrictions Weight Bearing Restrictions: No     Mobility  Bed Mobility Overal bed mobility: Needs Assistance Bed Mobility: Rolling;Sidelying to Sit Rolling: Supervision Sidelying to sit: Supervision       General bed mobility comments: recliner at entry/exit    Transfers Overall transfer level: Modified independent Equipment used: Rolling walker (2 wheels) Transfers: Sit to/from Stand Sit to Stand: Modified independent (Device/Increase time)           General transfer comment: VC for hand placement to improve technique    Ambulation/Gait Ambulation/Gait assistance: Supervision Gait Distance (Feet): 260 Feet Assistive device: Rolling walker (2 wheels) Gait Pattern/deviations: WFL(Within Functional Limits) Gait velocity: 0.50m/s today, 0.45m/s on day 1     General Gait Details: antalgic and slow   Stairs Stairs: Yes Stairs assistance: Supervision Stair Management: Two rails;One rail Left;No rails;Step to pattern Number of Stairs: 8     Wheelchair Mobility    Modified Rankin (Stroke Patients Only)       Balance Overall balance assessment: No apparent balance deficits (not formally assessed)                                          Cognition Arousal/Alertness: Awake/alert Behavior During Therapy: WFL for tasks assessed/performed Overall Cognitive Status: Within Functional Limits for tasks assessed  Exercises General Exercises - Lower Extremity Hip Flexion/Marching: AAROM;Left;5 reps;Seated Other Exercises Other Exercises: log  roll bed mobility training, precautions Other Exercises: pt declined to don lumbar corset brace for ambulatino in room with OT. Pt agreeable to don prior to attempting stairs with PT later.    General Comments        Pertinent Vitals/Pain Pain Assessment: 0-10 Pain Score: 7  Pain Location: low back, ABD incision, left thigh Pain Descriptors / Indicators: Aching Pain Intervention(s): Limited activity within patient's tolerance;Monitored during session    Home Living                          Prior Function            PT Goals (current goals can now be found in the care plan section) Acute Rehab PT Goals Patient Stated Goal: have better tolerated mobility for IADL than PTA PT Goal Formulation: With patient Time For Goal Achievement: 09/21/21 Potential to Achieve Goals: Good Progress towards PT goals: Progressing toward goals    Frequency    7X/week      PT Plan Current plan remains appropriate    Co-evaluation              AM-PAC PT "6 Clicks" Mobility   Outcome Measure  Help needed turning from your back to your side while in a flat bed without using bedrails?: A Little Help needed moving from lying on your back to sitting on the side of a flat bed without using bedrails?: A Little Help needed moving to and from a bed to a chair (including a wheelchair)?: A Little Help needed standing up from a chair using your arms (e.g., wheelchair or bedside chair)?: A Little Help needed to walk in hospital room?: A Little Help needed climbing 3-5 steps with a railing? : A Little 6 Click Score: 18    End of Session Equipment Utilized During Treatment: Gait belt;Back brace Activity Tolerance: Patient tolerated treatment well;No increased pain;Patient limited by pain Patient left: with call bell/phone within reach;in chair;with nursing/sitter in room Nurse Communication: Mobility status PT Visit Diagnosis: Difficulty in walking, not elsewhere classified  (R26.2);Other abnormalities of gait and mobility (R26.89)     Time: 2297-9892 PT Time Calculation (min) (ACUTE ONLY): 34 min  Charges:  $Gait Training: 23-37 mins           2:02 PM, 09/08/21 Rosamaria Lints, PT, DPT Physical Therapist - [facility]  [phone] (ASCOM)     Margurete Guaman C 09/08/2021, 1:55 PM

## 2021-09-08 NOTE — Progress Notes (Signed)
    Attending Progress Note  History: Phyllis Myers is s/p L5-S1 ALIF, L5-S1 posterolateral fusion on 09/06/21  POD#2: feels weak in left leg this morning. Continues to pass gas. Has urinated since foley was removed yesterday evening.   POD#1: Pt complaining low back pain this morning.   Physical Exam: Vitals:   09/08/21 0342 09/08/21 0737  BP: 113/83 111/67  Pulse: 90 97  Resp: 18 18  Temp: 98 F (36.7 C) 98.6 F (37 C)  SpO2: 98% 100%    AA Ox3 CNI Strength:5/5 throughout BLE  except 3/5 left HF No obvious leg swelling or redness. good pedal pulses.   Data:  Recent Labs  Lab 09/06/21 1250  NA 138  K 4.7  CL 108  CO2 23  BUN <5*  CREATININE 0.55  GLUCOSE 209*  CALCIUM 7.7*    No results for input(s): AST, ALT, ALKPHOS in the last 168 hours.  Invalid input(s): TBILI   Recent Labs  Lab 09/06/21 1250 09/07/21 0435  WBC 9.4 11.2*  HGB 11.7* 10.0*  HCT 32.4* 28.3*  PLT 151 131*    No results for input(s): APTT, INR in the last 168 hours.       Other tests/results: repeat CBC pending   Assessment/Plan:  Phyllis Myers is a pleasant 40 y.o presenting with back and right leg pain s/p L5-S1 ALIF and PSF.  - mobilize - pain control - DVT prophylaxis; started lovenox on 09/06/21. Plan to d/c home on Eliquis per vascular recommendation - PTOT - continue bowel regimen - continue to monitor BP. Will repeat CBC this morning  Manning Charity PA-C Department of Neurosurgery

## 2021-09-08 NOTE — Progress Notes (Signed)
Occupational Therapy Treatment Patient Details Name: Phyllis Myers MRN: [id number] DOB: 1981-08-01 Today's Date: 09/08/2021   History of present illness Phyllis Myers is a 40yoF who comes to [facility] on 12/7 for scheduled L5-S1 ALIF, L5-S1 posterolateral fusion with Dr. Myer Haff.   OT comments  Pt seen for OT tx. Pt lying in bed demonstrating sub-optimal spinal positioning. Pt educated in spinal precautions again and instructed in improved positioning while in bed to maximize adherence to precautions while also providing some improved comfort. Pt verbalized understanding. Pt performed log roll technique for bed mobility requiring supervision and good carryover of learned strategies. Pt declined to don back brace for OOB at this time 2/2 having anterior and posterior dressings removed this morning and she was concerned about the friction of the brace on the incisions. Pt reassured that adequate coverage would be provided. Pt required VC for hand placement to improve ADL transfers. Pt able to negotiate obstacles in the room with RW and supervision while maintaining back precautions. Pt left in recliner. PT notified that pt is ready for stairs.    Recommendations for follow up therapy are one component of a multi-disciplinary discharge planning process, led by the attending physician.  Recommendations may be updated based on patient status, additional functional criteria and insurance authorization.    Follow Up Recommendations  No OT follow up    Assistance Recommended at Discharge PRN  Equipment Recommendations  BSC/3in1;Other (comment) (LH sponge, handheld shower head at aunt's house)    Recommendations for Other Services      Precautions / Restrictions Precautions Precautions: Back Precaution Comments: no bending, lifting, twisting, or arching Required Braces or Orthoses: Spinal Brace Spinal Brace: Lumbar corset;Applied in sitting position Restrictions Weight Bearing Restrictions: No        Mobility Bed Mobility Overal bed mobility: Needs Assistance Bed Mobility: Rolling;Sidelying to Sit Rolling: Supervision Sidelying to sit: Supervision       General bed mobility comments: supervision, able to perform relatively well, no VC required    Transfers Overall transfer level: Needs assistance Equipment used: Rolling walker (2 wheels) Transfers: Sit to/from Stand Sit to Stand: Supervision           General transfer comment: VC for hand placement to improve technique     Balance Overall balance assessment: No apparent balance deficits (not formally assessed)                                         ADL either performed or assessed with clinical judgement   ADL Overall ADL's : Needs assistance/impaired                     Lower Body Dressing: Maximal assistance;Bed level Lower Body Dressing Details (indicate cue type and reason): unable to don socks requiring MAX A             Functional mobility during ADLs: Supervision/safety;Rolling walker (2 wheels)      Extremity/Trunk Assessment     Lower Extremity Assessment Lower Extremity Assessment: LLE deficits/detail LLE Deficits / Details: decreased hip flexion strength with pt indicating increased pain from anterior groin/L hip down to top of her knee and unable to perform SLR or full hip flexion in standing - pt reports this is new as of this morning   Cervical / Trunk Assessment Cervical / Trunk Assessment: Back Surgery    Vision  Perception     Praxis      Cognition Arousal/Alertness: Awake/alert Behavior During Therapy: WFL for tasks assessed/performed Overall Cognitive Status: Within Functional Limits for tasks assessed                                            Exercises Other Exercises Other Exercises: log roll bed mobility training, precautions Other Exercises: pt declined to don lumbar corset brace for ambulatino in room with  OT. Pt agreeable to don prior to attempting stairs with PT later.   Shoulder Instructions       General Comments      Pertinent Vitals/ Pain       Pain Assessment: 0-10 Pain Score: 7  Pain Location: low back Pain Descriptors / Indicators: Aching Pain Intervention(s): Limited activity within patient's tolerance;Monitored during session;Premedicated before session;Repositioned  Home Living                                          Prior Functioning/Environment              Frequency  Min 2X/week        Progress Toward Goals  OT Goals(current goals can now be found in the care plan section)  Progress towards OT goals: Progressing toward goals  Acute Rehab OT Goals Patient Stated Goal: go home and recover, get back to work OT Goal Formulation: With patient Time For Goal Achievement: 09/21/21 Potential to Achieve Goals: Good  Plan Discharge plan remains appropriate;Frequency remains appropriate    Co-evaluation                 AM-PAC OT "6 Clicks" Daily Activity     Outcome Measure   Help from another person eating meals?: None Help from another person taking care of personal grooming?: None Help from another person toileting, which includes using toliet, bedpan, or urinal?: A Little Help from another person bathing (including washing, rinsing, drying)?: A Little Help from another person to put on and taking off regular upper body clothing?: None Help from another person to put on and taking off regular lower body clothing?: A Lot 6 Click Score: 20    End of Session Equipment Utilized During Treatment: Rolling walker (2 wheels)  OT Visit Diagnosis: Other abnormalities of gait and mobility (R26.89);Pain Pain - Right/Left: Left Pain - part of body: Hip;Leg   Activity Tolerance Patient tolerated treatment well   Patient Left in chair;with call bell/phone within reach   Nurse Communication          Time: 3016-0109 OT Time  Calculation (min): 10 min  Charges: OT General Charges $OT Visit: 1 Visit OT Treatments $Self Care/Home Management : 8-22 mins  Arman Filter., MPH, MS, OTR/L ascom [phone] 09/08/21, 1:25 PM

## 2021-09-09 MED ORDER — APIXABAN 2.5 MG PO TABS: 2.5000 mg | ORAL_TABLET | Freq: Two times a day (BID) | ORAL | 0 refills | Status: DC

## 2021-09-09 MED ORDER — OXYCODONE HCL 5 MG PO TABS: 5.0000 mg | ORAL_TABLET | ORAL | 0 refills | Status: DC | PRN

## 2021-09-09 MED ORDER — SENNOSIDES-DOCUSATE SODIUM 8.6-50 MG PO TABS: 1.0000 | ORAL_TABLET | Freq: Two times a day (BID) | ORAL | 0 refills | Status: DC | PRN

## 2021-09-09 MED ORDER — CELECOXIB 100 MG PO CAPS: 100.0000 mg | ORAL_CAPSULE | Freq: Two times a day (BID) | ORAL | 2 refills | Status: DC

## 2021-09-09 MED ORDER — SENNOSIDES-DOCUSATE SODIUM 8.6-50 MG PO TABS
1.0000 | ORAL_TABLET | Freq: Two times a day (BID) | ORAL | Status: DC
Start: 2021-09-09 — End: 2021-09-09
  Administered 2021-09-09: 1 via ORAL
  Filled 2021-09-09: qty 1

## 2021-09-09 MED ORDER — METHOCARBAMOL 500 MG PO TABS: 500.0000 mg | ORAL_TABLET | Freq: Four times a day (QID) | ORAL | 0 refills | Status: DC | PRN

## 2021-09-09 MED ORDER — LACTULOSE 10 GM/15ML PO SOLN
10.0000 g | Freq: Every day | ORAL | Status: DC | PRN
  Administered 2021-09-09: 10 g via ORAL
  Filled 2021-09-09 (×2): qty 30

## 2021-09-09 NOTE — Discharge Summary (Signed)
Physician Discharge Summary  Patient ID: ESMEE FALLAW MRN: [id number] DOB/AGE: 06/24/1981 40 y.o.  Admit date: 09/06/2021 Discharge date: 09/09/2021  Admission Diagnoses: spondylolisthesis  Discharge Diagnoses:  Principal Problem:   Spondylolisthesis   Discharged Condition: good  Hospital Course: Ms. Muller is a 40 yo female who presented with L5 on S1 spondylolisthesis.  She underwent surgical correction. She tolerated the procedure well.  She developed some L hip pain after surgery which improved with medications.  She was felt to be stable for discharge on POD3.  Consults: vascular surgery  Significant Diagnostic Studies: radiology: X-Ray: good placement of implants  Treatments: surgery: L5-S1 ALIF/PSF  Discharge Exam: Blood pressure 110/69, pulse 90, temperature 98.7 F (37.1 C), temperature source Oral, resp. rate 16, height 5\' 5"  (1.651 m), weight 79.4 kg, SpO2 99 %. General appearance: alert and cooperative CNI  MAEW5/5 except L HF 3/5  Disposition: Discharge disposition: 01-Home or Self Care      Discharge Instructions     For home use only DME 4 wheeled rolling walker with seat   Complete by: As directed    Patient needs a walker to treat with the following condition: Spondylolisthesis   Incentive spirometry RT   Complete by: As directed       Allergies as of 09/09/2021       Reactions   Amoxicillin    Muscle pain   Naproxen Sodium Rash   Heavy doses        Medication List     STOP taking these medications    Cosentyx 150 MG/ML Sosy Generic drug: Secukinumab   meloxicam 15 MG tablet Commonly known as: MOBIC   predniSONE 5 MG tablet Commonly known as: DELTASONE       TAKE these medications    apixaban 2.5 MG Tabs tablet Commonly known as: Eliquis Take 1 tablet (2.5 mg total) by mouth 2 (two) times daily.   celecoxib 100 MG capsule Commonly known as: CeleBREX Take 1 capsule (100 mg total) by mouth 2 (two) times daily.    diazepam 5 MG tablet Commonly known as: VALIUM TAKE 1 TABLET(5 MG) BY MOUTH EVERY 8 HOURS AS NEEDED FOR ANXIETY   Kyleena 19.5 MG IUD Generic drug: levonorgestrel Kyleena 17.5 mcg/24 hrs (12yrs) 19.5mg  intrauterine device   methocarbamol 500 MG tablet Commonly known as: ROBAXIN Take 1 tablet (500 mg total) by mouth every 6 (six) hours as needed for muscle spasms.   omeprazole 40 MG capsule Commonly known as: PRILOSEC TAKE 1 CAPSULE(40 MG) BY MOUTH DAILY   oxyCODONE 5 MG immediate release tablet Commonly known as: Oxy IR/ROXICODONE Take 1 tablet (5 mg total) by mouth every 3 (three) hours as needed for moderate pain ((score 4 to 6)).   senna-docusate 8.6-50 MG tablet Commonly known as: Senokot-S Take 1 tablet by mouth 2 (two) times daily as needed for mild constipation.   sertraline 25 MG tablet Commonly known as: ZOLOFT Take 1 tablet (25 mg total) by mouth daily.               Durable Medical Equipment  (From admission, onward)           Start     Ordered   09/09/21 0653  For home use only DME Bedside commode  Once       Question:  Patient needs a bedside commode to treat with the following condition  Answer:  Spondylolisthesis   09/09/21 0654   09/09/21 0000  For home use only DME 4  wheeled rolling walker with seat       Question:  Patient needs a walker to treat with the following condition  Answer:  Spondylolisthesis   09/09/21 0654   09/07/21 1401  For home use only DME Walker rolling  Once       Question Answer Comment  Walker: With 5 Inch Wheels   Patient needs a walker to treat with the following condition Weakness      09/07/21 1400            Follow-up Information     Dew, Marlow Baars, MD Follow up in 2 week(s).   Specialties: Vascular Surgery, Radiology, Interventional Cardiology Why: To see Dew. Incision check. No studies needed. Contact information: [address] [place] [address] [phone]         Susanne Borders, PA  Follow up in 2 week(s).   Why: previously scheduled Contact information: [address] [place] [address] [phone]                 Signed: [name] 09/09/2021, 6:54 AM

## 2021-09-09 NOTE — Discharge Instructions (Signed)
Your surgeon has performed an operation on your lumbar spine (low back) to relieve pressure on one or more nerves. Many times, patients feel better immediately after surgery and can "overdo it." Even if you feel well, it is important that you follow these activity guidelines. If you do not let your back heal properly from the surgery, you can increase the chance of a disc herniation and/or return of your symptoms. The following are instructions to help in your recovery once you have been discharged from the hospital.  * Do not take anti-inflammatory medications for 3 months after surgery (naproxen [Aleve], ibuprofen [Advil, Motrin], etc.)  Celebrex is ok to take.  We will reinitiate your more powerful medications after follow-up.  Activity    No bending, lifting, or twisting ("BLT"). Avoid lifting objects heavier than 10 pounds (gallon milk jug).  Where possible, avoid household activities that involve lifting, bending, pushing, or pulling such as laundry, vacuuming, grocery shopping, and childcare. Try to arrange for help from friends and family for these activities while your back heals.  Increase physical activity slowly as tolerated.  Taking short walks is encouraged, but avoid strenuous exercise. Do not jog, run, bicycle, lift weights, or participate in any other exercises unless specifically allowed by your doctor. Avoid prolonged sitting, including car rides.  Talk to your doctor before resuming sexual activity.  You should not drive until cleared by your doctor.  Until released by your doctor, you should not return to work or school.  You should rest at home and let your body heal.   You may shower two days after your surgery.  After showering, lightly dab your incision dry. Do not take a tub bath or go swimming for 3 weeks, or until approved by your doctor at your follow-up appointment.  If you smoke, we strongly recommend that you quit.  Smoking has been proven to interfere with  normal healing in your back and will dramatically reduce the success rate of your surgery. Please contact [facility] (800-QUIT-NOW) and use the resources at www.[facility].com for assistance in stopping smoking.  Surgical Incision   If you have a dressing on your incision, you may remove it three days after your surgery. Keep your incision area clean and dry.  If you have staples or stitches on your incision, you should have a follow up scheduled for removal. If you do not have staples or stitches, you will have steri-strips (small pieces of surgical tape) or Dermabond glue. The steri-strips/glue should begin to peel away within about a week (it is fine if the steri-strips fall off before then). If the strips are still in place one week after your surgery, you may gently remove them.  Diet            You may return to your usual diet. Be sure to stay hydrated.  When to Contact us  Although your surgery and recovery will likely be uneventful, you may have some residual numbness, aches, and pains in your back and/or legs. This is normal and should improve in the next few weeks.  However, should you experience any of the following, contact us immediately: New numbness or weakness Pain that is progressively getting worse, and is not relieved by your pain medications or rest Bleeding, redness, swelling, pain, or drainage from surgical incision Chills or flu-like symptoms Fever greater than 101.0 F (38.3 C) Problems with bowel or bladder functions Difficulty breathing or shortness of breath Warmth, tenderness, or swelling in your calf  Contact  Information During office hours (Monday-Friday 9 am to 5 pm), please call your physician at [phone] After hours and weekends, please call [phone] and speak with the answering service, who will contact the doctor on call.  If that fails, call the [facility] Operator at [phone] and ask for the Neurosurgery Resident On Call  For a life-threatening  emergency, call 911

## 2021-09-09 NOTE — Plan of Care (Signed)

## 2021-09-09 NOTE — Progress Notes (Signed)
Discharge instructions explained to the patient and the patient verbally expressed she understood. Both Ivs were taken out. Patient rolled to medical mall with all belongings.

## 2021-09-09 NOTE — Progress Notes (Signed)
Physical Therapy Treatment Patient Details Name: Phyllis Myers MRN: [id number] DOB: Dec 17, 1980 Today's Date: 09/09/2021   History of Present Illness Phyllis Myers is a 40yoF who comes to [facility] on 12/7 for scheduled L5-S1 ALIF, L5-S1 posterolateral fusion with Dr. Myer Haff.    PT Comments    Patient doing better today. She is still having pain in low back and LE. She does have discharge orders but has not had BM and will not be able to leave hospital without BM. Therapy focused on increasing mobility to help with bowel movement. Instructed patient in gait around hallway. She does require min Vcs to increase heel strike and improve step length to tolerance. Patient able to exhibit good reciprocal gait pattern. She does exhibit increased UE weight bearing and slower speed with fatigue. Gait speed improved today to 0.49 m/s. Pt negotiated steps with BUE railing, supervision, non-reciprocal. Reinforced HEP. Patient verbalized understanding of exercise and proper positioning. Denies any questions prior to going home regarding precautions or positioning.     Recommendations for follow up therapy are one component of a multi-disciplinary discharge planning process, led by the attending physician.  Recommendations may be updated based on patient status, additional functional criteria and insurance authorization.  Follow Up Recommendations  Follow physician's recommendations for discharge plan and follow up therapies (will plan on scheduling OPPT with [facility] as she did prehab)     Assistance Recommended at Discharge Set up Supervision/Assistance  Equipment Recommendations  Rolling walker (2 wheels);BSC/3in1    Recommendations for Other Services       Precautions / Restrictions Precautions Precautions: Back Precaution Booklet Issued: Yes (comment) Precaution Comments: BAT precautions Required Braces or Orthoses: Spinal Brace Spinal Brace: Lumbar corset;Applied in sitting  position Restrictions Weight Bearing Restrictions: No     Mobility  Bed Mobility               General bed mobility comments: patient standing in room at start of session;    Transfers Overall transfer level: Modified independent Equipment used: Rolling walker (2 wheels) Transfers: Sit to/from Stand Sit to Stand: Modified independent (Device/Increase time)           General transfer comment: good safety awareness getting up/down from bed;    Ambulation/Gait Ambulation/Gait assistance: Supervision;Min guard Gait Distance (Feet): 260 Feet Assistive device: Rolling walker (2 wheels) Gait Pattern/deviations: WFL(Within Functional Limits) Gait velocity: 0.49 m/s today     General Gait Details: good reciprocal gait pattern; slightly faster gait speed today; Guarded with increased UE weight bearing with fatigue   Stairs Stairs: Yes Stairs assistance: Supervision Stair Management: Two rails;Step to pattern;Forwards Number of Stairs: 4 General stair comments: exhibits good positioning and safety with stair negotiation;   Wheelchair Mobility    Modified Rankin (Stroke Patients Only)       Balance                                            Cognition Arousal/Alertness: Awake/alert Behavior During Therapy: WFL for tasks assessed/performed Overall Cognitive Status: Within Functional Limits for tasks assessed                                          Exercises Other Exercises Other Exercises: Reinforced HEP; patient verbalized understanding. no question regarding exercise  or positioning; Other Exercises: Reinforced importance of log roll technique when getting in/out of bed;    General Comments        Pertinent Vitals/Pain Pain Score: 6  Pain Location: low back/abdominal incision, LE Pain Descriptors / Indicators: Aching Pain Intervention(s): Limited activity within patient's tolerance;Repositioned    Home Living                           Prior Function            PT Goals (current goals can now be found in the care plan section) Acute Rehab PT Goals Patient Stated Goal: have better tolerated mobility for IADL than PTA PT Goal Formulation: With patient Time For Goal Achievement: 09/21/21 Potential to Achieve Goals: Good Progress towards PT goals: Progressing toward goals    Frequency    7X/week      PT Plan Current plan remains appropriate    Co-evaluation              AM-PAC PT "6 Clicks" Mobility   Outcome Measure  Help needed turning from your back to your side while in a flat bed without using bedrails?: A Little Help needed moving from lying on your back to sitting on the side of a flat bed without using bedrails?: A Little Help needed moving to and from a bed to a chair (including a wheelchair)?: A Little Help needed standing up from a chair using your arms (e.g., wheelchair or bedside chair)?: None Help needed to walk in hospital room?: A Little Help needed climbing 3-5 steps with a railing? : A Little 6 Click Score: 19    End of Session Equipment Utilized During Treatment: Gait belt;Back brace Activity Tolerance: Patient tolerated treatment well;No increased pain;Patient limited by pain Patient left: with call bell/phone within reach;in bed;with family/visitor present Nurse Communication: Mobility status PT Visit Diagnosis: Difficulty in walking, not elsewhere classified (R26.2);Other abnormalities of gait and mobility (R26.89)     Time: 5188-4166 PT Time Calculation (min) (ACUTE ONLY): 20 min  Charges:  $Gait Training: 8-22 mins                        Joee Iovine PT, DPT 09/09/2021, 1:18 PM

## 2021-09-09 NOTE — Progress Notes (Signed)
    Attending Progress Note  History: Phyllis Myers is s/p L5-S1 ALIF, L5-S1 posterolateral fusion on 09/06/21  POD#3: Pain improved.  Passing gas.  Leg pain improved.  Still feeling weakn on L HF.  POD#2: feels weak in left leg this morning. Continues to pass gas. Has urinated since foley was removed yesterday evening.   POD#1: Pt complaining low back pain this morning.   Physical Exam: Vitals:   09/08/21 2029 09/09/21 0500  BP: 106/71 110/69  Pulse: 98 90  Resp: 17 16  Temp: 98.3 F (36.8 C) 98.7 F (37.1 C)  SpO2: 100% 99%    AA Ox3 CNI Strength:5/5 throughout BLE  except 3/5 left HF No obvious leg swelling or redness. good pedal pulses.   Data:  Recent Labs  Lab 09/06/21 1250  NA 138  K 4.7  CL 108  CO2 23  BUN <5*  CREATININE 0.55  GLUCOSE 209*  CALCIUM 7.7*   No results for input(s): AST, ALT, ALKPHOS in the last 168 hours.  Invalid input(s): TBILI   Recent Labs  Lab 09/06/21 1250 09/07/21 0435 09/08/21 0850  WBC 9.4 11.2* 8.8  HGB 11.7* 10.0* 9.0*  HCT 32.4* 28.3* 25.2*  PLT 151 131* 122*   No results for input(s): APTT, INR in the last 168 hours.       Other tests/results: repeat CBC pending   Assessment/Plan:  Phyllis Myers is a pleasant 40 y.o presenting with back and right leg pain s/p L5-S1 ALIF and PSF.  - mobilize - pain control - DVT prophylaxis; started lovenox on 09/06/21. Plan to d/c home on Eliquis per vascular recommendation - PTOT - continue bowel regimen. BM today   [name] Department of Neurosurgery

## 2021-09-18 ENCOUNTER — Encounter: Payer: Self-pay | Admitting: Oncology

## 2021-09-19 ENCOUNTER — Other Ambulatory Visit: Payer: Self-pay

## 2021-09-19 ENCOUNTER — Ambulatory Visit (INDEPENDENT_AMBULATORY_CARE_PROVIDER_SITE_OTHER): Payer: 59 | Admitting: Vascular Surgery

## 2021-09-19 ENCOUNTER — Encounter (INDEPENDENT_AMBULATORY_CARE_PROVIDER_SITE_OTHER): Payer: Self-pay | Admitting: Vascular Surgery

## 2021-09-19 VITALS — BP 106/75 | HR 96 | Resp 16 | Wt 175.2 lb

## 2021-09-19 DIAGNOSIS — M4317 Spondylolisthesis, lumbosacral region: Secondary | ICD-10-CM

## 2021-09-19 DIAGNOSIS — I10 Essential (primary) hypertension: Secondary | ICD-10-CM

## 2021-09-19 NOTE — Progress Notes (Signed)
Patient ID: Phyllis Myers, female   DOB: November 02, 1980, 40 y.o.   MRN: [id number]  Chief Complaint  Patient presents with   Follow-up    [facility] 2wk follow up    HPI Phyllis Myers is a 40 y.o. female.  Patient returns about 2 weeks after anterior spine exposure for an L5-S1 anterior lumbar interbody fusion.  We performed exposure for neurosurgery.  She is doing reasonably well today.  She still some numbness around her incision.  Her incision is well-healed.  She has no significant leg swelling or pain.  She seems to be recovering relatively well.   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 [name]). Now off enbrel, methotrexate, uses indomethacin and hydrocodone rarely for pain.    Rheumatoid arthritis (HCC)     Past Surgical History:  Procedure Laterality Date   ABDOMINAL EXPOSURE N/A 09/06/2021   Procedure: ABDOMINAL EXPOSURE;  Surgeon: Annice Needy, MD;  Location: [facility] ORS;  Service: Vascular;  Laterality: N/A;   ANTERIOR LATERAL LUMBAR FUSION WITH PERCUTANEOUS SCREW 1 LEVEL N/A 09/06/2021   Procedure: L5-S1 ANTERIOR LATERAL LUMBAR FUSION WITH PERCUTANEOUS SCREW;  Surgeon: [name], MD;  Location: [facility] ORS;  Service: Neurosurgery;  Laterality: N/A;   PERONEAL NERVE DECOMPRESSION Left 12/14/2019   Procedure: PERONEAL NERVE DECOMPRESSION;  Surgeon: [name], MD;  Location: [facility] ORS;  Service: Neurosurgery;  Laterality: Left;   WISDOM TOOTH EXTRACTION        Allergies  Allergen Reactions   Amoxicillin     Muscle pain   Naproxen Sodium Rash    Heavy doses    Current Outpatient Medications  Medication Sig Dispense  Refill   apixaban (ELIQUIS) 2.5 MG TABS tablet Take 1 tablet (2.5 mg total) by mouth 2 (two) times daily. 60 tablet 0   celecoxib (CELEBREX) 100 MG capsule Take 1 capsule (100 mg total) by mouth 2 (two) times daily. 60 capsule 2   diazepam (VALIUM) 5 MG tablet TAKE 1 TABLET(5 MG) BY MOUTH EVERY 8 HOURS AS NEEDED FOR ANXIETY 30 tablet 3   levonorgestrel (KYLEENA) 19.5 MG IUD Kyleena 17.5 mcg/24 hrs (68yrs) 19.5mg  intrauterine device     methocarbamol (ROBAXIN) 500 MG tablet Take 1 tablet (500 mg total) by mouth every 6 (six) hours as needed for muscle spasms. 120 tablet 0   omeprazole (PRILOSEC) 40 MG capsule TAKE 1 CAPSULE(40 MG) BY MOUTH DAILY 90 capsule 0   oxyCODONE (OXY IR/ROXICODONE) 5 MG immediate release tablet Take 1 tablet (5 mg total) by mouth every 3 (three) hours as needed for moderate pain ((score 4 to 6)). 30 tablet 0   senna-docusate (SENOKOT-S) 8.6-50 MG tablet Take 1 tablet by mouth 2 (two) times daily as needed for mild constipation. 30 tablet 0   sertraline (ZOLOFT) 25 MG tablet Take 1 tablet (25 mg total) by mouth daily. 30 tablet 2   No current facility-administered medications for this visit.        Physical Exam BP 106/75 (BP Location: Left Arm)    Pulse 96    Resp 16    Wt 175 lb 3.2 oz (79.5 kg)  BMI 29.15 kg/m  Gen:  WD/WN, NAD Skin: incision C/D/I     Assessment/Plan:  Spondylolisthesis Patient is doing reasonably well 2 weeks after anterior spine exposure for anterior lumbar interbody fusion L5-S1 with neurosurgery.  Incision is healing well.  No lower extremity symptoms.  We will see her back on an as-needed basis.  HTN (hypertension) blood pressure control important in reducing the progression of atherosclerotic disease. On appropriate oral medications.      Festus Barren 09/19/2021, 2:15 PM   This note was created with Dragon medical transcription system.  Any errors from dictation are unintentional.

## 2021-09-19 NOTE — Assessment & Plan Note (Signed)
blood pressure control important in reducing the progression of atherosclerotic disease. On appropriate oral medications.  

## 2021-09-19 NOTE — Assessment & Plan Note (Signed)
Patient is doing reasonably well 2 weeks after anterior spine exposure for anterior lumbar interbody fusion L5-S1 with neurosurgery.  Incision is healing well.  No lower extremity symptoms.  We will see her back on an as-needed basis.

## 2021-10-04 ENCOUNTER — Telehealth: Payer: Self-pay

## 2021-10-04 NOTE — Telephone Encounter (Signed)
Transition Care Management Unsuccessful Follow-up Telephone Call  Date of discharge and from where:  09/09/21 from [facility]  Attempts:  1st Attempt  Reason for unsuccessful TCM follow-up call:  Left voice message  Kathyrn Sheriff, RN, MSN, BSN, CCM [facility] Care Management Coordinator [phone]

## 2021-10-05 ENCOUNTER — Telehealth: Payer: Self-pay

## 2021-10-05 NOTE — Telephone Encounter (Signed)
Transition Care Management Unsuccessful Follow-up Telephone Call  Date of discharge and from where:  09/09/2021   [facility]  Attempts:  2nd Attempt  Reason for unsuccessful TCM follow-up call:  No answer/busy  Rowe Pavy, RN, BSN, CEN [facility] Coordinator [phone]

## 2021-10-11 ENCOUNTER — Other Ambulatory Visit: Payer: Self-pay | Admitting: *Deleted

## 2021-10-11 DIAGNOSIS — Z148 Genetic carrier of other disease: Secondary | ICD-10-CM

## 2021-10-18 ENCOUNTER — Telehealth: Payer: Self-pay | Admitting: Nurse Practitioner

## 2021-10-18 ENCOUNTER — Telehealth: Payer: Self-pay | Admitting: Oncology

## 2021-10-18 NOTE — Telephone Encounter (Signed)
Spoke with patient about lab work and phlebotomy. Patient requested to possibly have lab work drawn at her appt with [facility] on 1/19 so that she would not have to receive repeat labwork. Lab orders faxed to [facility]. Pt advised to keep phlebotomy appt as scheduled and we could cancel if needed once labs resulted.

## 2021-10-18 NOTE — Telephone Encounter (Signed)
Patient called and requested a call back from the nurse. She is scheduled for labs on 1/19 and possible phleb on 1/23. She stated that she had surgery recently and had to receive a blood transfusion.

## 2021-10-18 NOTE — Telephone Encounter (Signed)
Pt called in returning a phone call from last week. Call back at [phone]

## 2021-10-19 ENCOUNTER — Inpatient Hospital Stay: Payer: 59 | Attending: Oncology

## 2021-10-23 ENCOUNTER — Ambulatory Visit: Payer: 59 | Admitting: Oncology

## 2021-10-23 ENCOUNTER — Inpatient Hospital Stay: Payer: 59

## 2021-10-23 ENCOUNTER — Inpatient Hospital Stay: Payer: 59 | Admitting: Nurse Practitioner

## 2021-11-01 ENCOUNTER — Encounter: Payer: Self-pay | Admitting: Neurosurgery

## 2021-11-10 ENCOUNTER — Encounter: Payer: Self-pay | Admitting: Rheumatology

## 2021-11-17 ENCOUNTER — Other Ambulatory Visit: Payer: Self-pay | Admitting: Family Medicine

## 2021-12-07 ENCOUNTER — Other Ambulatory Visit: Payer: Self-pay

## 2021-12-07 ENCOUNTER — Inpatient Hospital Stay
Admission: EM | Admit: 2021-12-07 | Discharge: 2021-12-13 | DRG: 916 | Disposition: A | Discharge status: Home / Self Care | Payer: 59 | Attending: Internal Medicine | Admitting: Internal Medicine

## 2021-12-07 ENCOUNTER — Encounter: Payer: Self-pay | Admitting: Emergency Medicine

## 2021-12-07 DIAGNOSIS — R410 Disorientation, unspecified: Secondary | ICD-10-CM

## 2021-12-07 DIAGNOSIS — E872 Acidosis, unspecified: Secondary | ICD-10-CM | POA: Diagnosis present

## 2021-12-07 DIAGNOSIS — W1830XA Fall on same level, unspecified, initial encounter: Secondary | ICD-10-CM | POA: Diagnosis present

## 2021-12-07 DIAGNOSIS — R7401 Elevation of levels of liver transaminase levels: Secondary | ICD-10-CM | POA: Diagnosis present

## 2021-12-07 DIAGNOSIS — I10 Essential (primary) hypertension: Secondary | ICD-10-CM | POA: Diagnosis present

## 2021-12-07 DIAGNOSIS — M069 Rheumatoid arthritis, unspecified: Secondary | ICD-10-CM | POA: Diagnosis present

## 2021-12-07 DIAGNOSIS — R440 Auditory hallucinations: Secondary | ICD-10-CM | POA: Diagnosis not present

## 2021-12-07 DIAGNOSIS — F32A Depression, unspecified: Secondary | ICD-10-CM | POA: Diagnosis present

## 2021-12-07 DIAGNOSIS — L509 Urticaria, unspecified: Secondary | ICD-10-CM

## 2021-12-07 DIAGNOSIS — F411 Generalized anxiety disorder: Secondary | ICD-10-CM | POA: Diagnosis present

## 2021-12-07 DIAGNOSIS — Z884 Allergy status to anesthetic agent status: Secondary | ICD-10-CM | POA: Diagnosis not present

## 2021-12-07 DIAGNOSIS — T782XXA Anaphylactic shock, unspecified, initial encounter: Principal | ICD-10-CM | POA: Diagnosis present

## 2021-12-07 DIAGNOSIS — K219 Gastro-esophageal reflux disease without esophagitis: Secondary | ICD-10-CM | POA: Diagnosis present

## 2021-12-07 DIAGNOSIS — Z79899 Other long term (current) drug therapy: Secondary | ICD-10-CM | POA: Diagnosis not present

## 2021-12-07 DIAGNOSIS — F1010 Alcohol abuse, uncomplicated: Secondary | ICD-10-CM | POA: Diagnosis not present

## 2021-12-07 DIAGNOSIS — Z888 Allergy status to other drugs, medicaments and biological substances status: Secondary | ICD-10-CM | POA: Diagnosis not present

## 2021-12-07 DIAGNOSIS — H538 Other visual disturbances: Secondary | ICD-10-CM | POA: Diagnosis present

## 2021-12-07 DIAGNOSIS — F410 Panic disorder [episodic paroxysmal anxiety] without agoraphobia: Secondary | ICD-10-CM | POA: Diagnosis present

## 2021-12-07 DIAGNOSIS — E876 Hypokalemia: Secondary | ICD-10-CM | POA: Diagnosis present

## 2021-12-07 DIAGNOSIS — Z8249 Family history of ischemic heart disease and other diseases of the circulatory system: Secondary | ICD-10-CM | POA: Diagnosis not present

## 2021-12-07 DIAGNOSIS — Z20822 Contact with and (suspected) exposure to covid-19: Secondary | ICD-10-CM | POA: Diagnosis present

## 2021-12-07 DIAGNOSIS — R443 Hallucinations, unspecified: Secondary | ICD-10-CM | POA: Diagnosis not present

## 2021-12-07 DIAGNOSIS — K760 Fatty (change of) liver, not elsewhere classified: Secondary | ICD-10-CM | POA: Diagnosis present

## 2021-12-07 DIAGNOSIS — R112 Nausea with vomiting, unspecified: Secondary | ICD-10-CM

## 2021-12-07 DIAGNOSIS — H547 Unspecified visual loss: Secondary | ICD-10-CM | POA: Diagnosis present

## 2021-12-07 DIAGNOSIS — R109 Unspecified abdominal pain: Secondary | ICD-10-CM

## 2021-12-07 LAB — CBC WITH DIFFERENTIAL/PLATELET
Abs Immature Granulocytes: 0.12 10*3/uL — ABNORMAL HIGH (ref 0.00–0.07)
Basophils Absolute: 0.1 10*3/uL (ref 0.0–0.1)
Basophils Relative: 1 %
Eosinophils Absolute: 0.3 10*3/uL (ref 0.0–0.5)
Eosinophils Relative: 2 %
HCT: 45.7 % (ref 36.0–46.0)
Hemoglobin: 15.6 g/dL — ABNORMAL HIGH (ref 12.0–15.0)
Immature Granulocytes: 1 %
Lymphocytes Relative: 23 %
Lymphs Abs: 3 10*3/uL (ref 0.7–4.0)
MCH: 32 pg (ref 26.0–34.0)
MCHC: 34.1 g/dL (ref 30.0–36.0)
MCV: 93.8 fL (ref 80.0–100.0)
Monocytes Absolute: 0.6 10*3/uL (ref 0.1–1.0)
Monocytes Relative: 4 %
Neutro Abs: 9.3 10*3/uL — ABNORMAL HIGH (ref 1.7–7.7)
Neutrophils Relative %: 69 %
Platelets: 299 10*3/uL (ref 150–400)
RBC: 4.87 MIL/uL (ref 3.87–5.11)
RDW: 13.2 % (ref 11.5–15.5)
WBC: 13.4 10*3/uL — ABNORMAL HIGH (ref 4.0–10.5)
nRBC: 0 % (ref 0.0–0.2)

## 2021-12-07 LAB — COMPREHENSIVE METABOLIC PANEL
ALT: 22 U/L (ref 0–44)
AST: 48 U/L — ABNORMAL HIGH (ref 15–41)
Albumin: 3.5 g/dL (ref 3.5–5.0)
Alkaline Phosphatase: 62 U/L (ref 38–126)
Anion gap: 19 — ABNORMAL HIGH (ref 5–15)
BUN: 9 mg/dL (ref 6–20)
CO2: 13 mmol/L — ABNORMAL LOW (ref 22–32)
Calcium: 8.2 mg/dL — ABNORMAL LOW (ref 8.9–10.3)
Chloride: 104 mmol/L (ref 98–111)
Creatinine, Ser: 0.72 mg/dL (ref 0.44–1.00)
GFR, Estimated: >60 mL/min (ref >60)
Glucose, Bld: 220 mg/dL — ABNORMAL HIGH (ref 70–99)
Potassium: 3.8 mmol/L (ref 3.5–5.1)
Sodium: 136 mmol/L (ref 135–145)
Total Bilirubin: 1.5 mg/dL — ABNORMAL HIGH (ref 0.3–1.2)
Total Protein: 6.8 g/dL (ref 6.5–8.1)

## 2021-12-07 LAB — GLUCOSE, CAPILLARY: Glucose-Capillary: 200 mg/dL — ABNORMAL HIGH (ref 70–99)

## 2021-12-07 LAB — BLOOD GAS, VENOUS
Acid-base deficit: 3.8 mmol/L — ABNORMAL HIGH (ref 0.0–2.0)
Bicarbonate: 18.8 mmol/L — ABNORMAL LOW (ref 20.0–28.0)
O2 Saturation: 96.9 %
Patient temperature: 37
pCO2, Ven: 27 mmHg — ABNORMAL LOW (ref 44–60)
pH, Ven: 7.45 — ABNORMAL HIGH (ref 7.25–7.43)
pO2, Ven: 75 mmHg — ABNORMAL HIGH (ref 32–45)

## 2021-12-07 LAB — MRSA NEXT GEN BY PCR, NASAL: MRSA by PCR Next Gen: NOT DETECTED

## 2021-12-07 LAB — RESP PANEL BY RT-PCR (FLU A&B, COVID) ARPGX2
Influenza A by PCR: NEGATIVE
Influenza B by PCR: NEGATIVE
SARS Coronavirus 2 by RT PCR: NEGATIVE

## 2021-12-07 LAB — TROPONIN I (HIGH SENSITIVITY)
Troponin I (High Sensitivity): 20 ng/L — ABNORMAL HIGH (ref <18)
Troponin I (High Sensitivity): 111 ng/L (ref <18)

## 2021-12-07 LAB — PHOSPHORUS: Phosphorus: 4.3 mg/dL (ref 2.5–4.6)

## 2021-12-07 LAB — LACTIC ACID, PLASMA: Lactic Acid, Venous: 3.6 mmol/L (ref 0.5–1.9)

## 2021-12-07 LAB — MAGNESIUM: Magnesium: 1.7 mg/dL (ref 1.7–2.4)

## 2021-12-07 MED ORDER — ONDANSETRON HCL 4 MG/2ML IJ SOLN
INTRAMUSCULAR | Status: AC
  Administered 2021-12-07: 19:00:00 4 mg via INTRAVENOUS
  Filled 2021-12-07: qty 2

## 2021-12-07 MED ORDER — METHYLPREDNISOLONE SODIUM SUCC 125 MG IJ SOLR
125.0000 mg | Freq: Once | INTRAMUSCULAR | Status: AC
  Administered 2021-12-07: 21:00:00 125 mg via INTRAVENOUS
  Filled 2021-12-07 (×2): qty 2

## 2021-12-07 MED ORDER — LACTATED RINGERS IV BOLUS
1000.0000 mL | Freq: Once | INTRAVENOUS | Status: AC
  Administered 2021-12-07: 22:00:00 1000 mL via INTRAVENOUS

## 2021-12-07 MED ORDER — ONDANSETRON HCL 4 MG/2ML IJ SOLN: 4.0000 mg | Freq: Once | INTRAMUSCULAR | Status: AC

## 2021-12-07 MED ORDER — HEPARIN SODIUM (PORCINE) 5000 UNIT/ML IJ SOLN
5000.0000 [IU] | Freq: Three times a day (TID) | INTRAMUSCULAR | Status: DC
  Administered 2021-12-08 – 2021-12-11 (×11): 5000 [IU] via SUBCUTANEOUS
  Filled 2021-12-07 (×11): qty 1

## 2021-12-07 MED ORDER — LACTATED RINGERS IV SOLN: INTRAVENOUS | Status: AC

## 2021-12-07 MED ORDER — SODIUM CHLORIDE 0.9 % IV BOLUS
1000.0000 mL | Freq: Once | INTRAVENOUS | Status: AC
  Administered 2021-12-07: 19:00:00 1000 mL via INTRAVENOUS

## 2021-12-07 MED ORDER — EPINEPHRINE PF 1 MG/ML IJ SOLN
0.3000 mg | Freq: Once | INTRAMUSCULAR | Status: AC
Start: 2021-12-07 — End: 2021-12-07
  Administered 2021-12-07: 20:00:00 0.3 mg via INTRAVENOUS

## 2021-12-07 MED ORDER — EPINEPHRINE 1 MG/10ML IJ SOSY
PREFILLED_SYRINGE | INTRAMUSCULAR | Status: AC
  Administered 2021-12-07: 19:00:00 0.3 mg
  Filled 2021-12-07: qty 10

## 2021-12-07 MED ORDER — EPINEPHRINE PF 1 MG/ML IJ SOLN
0.3000 mg | Freq: Once | INTRAMUSCULAR | Status: AC
  Administered 2021-12-07: 23:00:00 0.3 mg via INTRAVENOUS
  Filled 2021-12-07: qty 1

## 2021-12-07 MED ORDER — FENTANYL CITRATE PF 50 MCG/ML IJ SOSY
PREFILLED_SYRINGE | INTRAMUSCULAR | Status: AC
  Administered 2021-12-07: 50 ug via INTRAVENOUS
  Filled 2021-12-07: qty 1

## 2021-12-07 MED ORDER — FAMOTIDINE IN NACL 20-0.9 MG/50ML-% IV SOLN
20.0000 mg | Freq: Two times a day (BID) | INTRAVENOUS | Status: DC
  Administered 2021-12-08 – 2021-12-11 (×8): 20 mg via INTRAVENOUS
  Filled 2021-12-07 (×8): qty 50

## 2021-12-07 MED ORDER — DOCUSATE SODIUM 100 MG PO CAPS
100.0000 mg | ORAL_CAPSULE | Freq: Two times a day (BID) | ORAL | Status: DC | PRN
  Filled 2021-12-07: qty 1

## 2021-12-07 MED ORDER — DIAZEPAM 5 MG/ML IJ SOLN
5.0000 mg | Freq: Once | INTRAMUSCULAR | Status: AC
  Administered 2021-12-07: 23:00:00 5 mg via INTRAVENOUS
  Filled 2021-12-07: qty 2

## 2021-12-07 MED ORDER — FENTANYL CITRATE PF 50 MCG/ML IJ SOSY: 50.0000 ug | PREFILLED_SYRINGE | Freq: Once | INTRAMUSCULAR | Status: DC

## 2021-12-07 MED ORDER — DIPHENHYDRAMINE HCL 50 MG/ML IJ SOLN
50.0000 mg | INTRAMUSCULAR | Status: DC
  Administered 2021-12-07 – 2021-12-11 (×22): 50 mg via INTRAVENOUS
  Filled 2021-12-07 (×23): qty 1

## 2021-12-07 MED ORDER — EPINEPHRINE HCL 5 MG/250ML IV SOLN IN NS
0.5000 ug/min | INTRAVENOUS | Status: DC
  Administered 2021-12-07: 1 ug/min via INTRAVENOUS
  Administered 2021-12-07: 3 ug/min via INTRAVENOUS
  Administered 2021-12-08 (×3): 7 ug/min via INTRAVENOUS
  Administered 2021-12-10: 1 ug/min via INTRAVENOUS
  Filled 2021-12-07 (×5): qty 250

## 2021-12-07 MED ORDER — CHLORHEXIDINE GLUCONATE CLOTH 2 % EX PADS
6.0000 | MEDICATED_PAD | Freq: Every day | CUTANEOUS | Status: DC
Start: 2021-12-08 — End: 2021-12-12
  Administered 2021-12-07 – 2021-12-11 (×4): 6 via TOPICAL
  Filled 2021-12-07: qty 6

## 2021-12-07 MED ORDER — ONDANSETRON HCL 4 MG/2ML IJ SOLN: 4.0000 mg | Freq: Four times a day (QID) | INTRAMUSCULAR | Status: DC | PRN

## 2021-12-07 MED ORDER — POLYETHYLENE GLYCOL 3350 17 G PO PACK
17.0000 g | PACK | Freq: Every day | ORAL | Status: DC | PRN
  Filled 2021-12-07: qty 1

## 2021-12-07 MED ORDER — ALBUTEROL SULFATE (2.5 MG/3ML) 0.083% IN NEBU: 2.5000 mg | INHALATION_SOLUTION | RESPIRATORY_TRACT | Status: DC | PRN

## 2021-12-07 MED ORDER — ACETAMINOPHEN 325 MG PO TABS: 650.0000 mg | ORAL_TABLET | ORAL | Status: DC | PRN

## 2021-12-07 MED ORDER — PANTOPRAZOLE SODIUM 40 MG IV SOLR: 40.0000 mg | Freq: Every day | INTRAVENOUS | Status: DC

## 2021-12-07 MED ORDER — INSULIN ASPART 100 UNIT/ML IJ SOLN
0.0000 [IU] | INTRAMUSCULAR | Status: DC
  Administered 2021-12-08: 8 [IU] via SUBCUTANEOUS
  Administered 2021-12-08: 5 [IU] via SUBCUTANEOUS
  Filled 2021-12-07 (×2): qty 1

## 2021-12-07 MED ORDER — FENTANYL CITRATE PF 50 MCG/ML IJ SOSY
25.0000 ug | PREFILLED_SYRINGE | Freq: Once | INTRAMUSCULAR | Status: AC
  Administered 2021-12-07: 23:00:00 25 ug via INTRAVENOUS
  Filled 2021-12-07: qty 1

## 2021-12-07 NOTE — ED Notes (Addendum)
Pt experiencing SOB/ wheezing episode and x2 episodes of nausea. EDP Bradler paged, and at bedside. Zofran and Epi 0.3 mg given.  ?

## 2021-12-07 NOTE — ED Provider Notes (Signed)
? ?[facility] ?Provider Note ? ? Event Date/Time  ? First MD Initiated Contact with Patient 12/07/21 1816   ?  (approximate) ?History  ?Allergic Reaction ? ?HPI ?KRISANDRA BRIZUELA is a 41 y.o. female with a stated past medical history of rheumatoid arthritis who presents via EMS after an injection of a new biologic today (Cimzia) with an anaphylactic reaction.  Patient immediately began breaking out into a itchy red rash as well as complaining of wheezing, nausea, and lightheadedness.  EMS found patient to be hypotensive in the 60s/40s then ordered after 2 IM doses of epinephrine.  Patient was also given 50 mg of Benadryl.  Patient states that symptoms have significantly improved including her rash, breathing, and nausea.  Patient states her only known allergy is amoxicillin and naproxen ?Physical Exam  ?Triage Vital Signs: ?ED Triage Vitals  ?Enc Vitals Group  ?   BP 12/07/21 1825 (!) 148/108  ?   Pulse Rate 12/07/21 1825 (!) 125  ?   Resp 12/07/21 1825 (!) 26  ?   Temp 12/07/21 1825 (!) 97.4 ?F (36.3 ?C)  ?   Temp Source 12/07/21 1825 Oral  ?   SpO2 12/07/21 1824 99 %  ?   Weight 12/07/21 1826 175 lb (79.4 kg)  ?   Height 12/07/21 1826 5\' 5"  (1.651 m)  ?   Head Circumference --   ?   Peak Flow --   ?   Pain Score 12/07/21 1826 0  ?   Pain Loc --   ?   Pain Edu? --   ?   Excl. in [place]? --   ? ?Most recent vital signs: ?Vitals:  ? 12/07/21 1904 12/07/21 1930  ?BP:  108/78  ?Pulse:  (!) 149  ?Resp:  (!) 26  ?Temp: 98.2 ?F (36.8 ?C)   ?SpO2:  95%  ? ?General: Awake, oriented x4. ?CV:  Good peripheral perfusion.  Tachycardic regular.  Pulse ?Resp:  Increased effort.  Clear to auscultation bilaterally abdomen ?Abd:  No distention.  ?Other:  Middle-aged overweight Caucasian female in the morning and tremulous. ?ED Results / Procedures / Treatments  ?Labs ?(all labs ordered are listed, but only abnormal results are displayed) ?Labs Reviewed  ?COMPREHENSIVE METABOLIC PANEL - Abnormal; Notable for the  following components:  ?    Result Value  ? CO2 13 (*)   ? Glucose, Bld 220 (*)   ? Calcium 8.2 (*)   ? AST 48 (*)   ? Total Bilirubin 1.5 (*)   ? Anion gap 19 (*)   ? All other components within normal limits  ?CBC WITH DIFFERENTIAL/PLATELET - Abnormal; Notable for the following components:  ? WBC 13.4 (*)   ? Hemoglobin 15.6 (*)   ? Neutro Abs 9.3 (*)   ? Abs Immature Granulocytes 0.12 (*)   ? All other components within normal limits  ?TROPONIN I (HIGH SENSITIVITY) - Abnormal; Notable for the following components:  ? Troponin I (High Sensitivity) 20 (*)   ? All other components within normal limits  ?RESP PANEL BY RT-PCR (FLU A&B, COVID) ARPGX2  ?HIV ANTIBODY (ROUTINE TESTING W REFLEX)  ?MAGNESIUM  ?PHOSPHORUS  ?BLOOD GAS, VENOUS  ?MAGNESIUM  ?PHOSPHORUS  ?COMPREHENSIVE METABOLIC PANEL  ?CBC WITH DIFFERENTIAL/PLATELET  ? ?EKG ?ED ECG REPORT ?I, Naaman Plummer, the attending physician, personally viewed and interpreted this ECG. ?Date: 12/07/2021 ?EKG Time: 1817 ?Rate: 124 ?Rhythm: normal sinus rhythm ?QRS Axis: normal ?Intervals: normal ?ST/T Wave abnormalities: normal ?Narrative Interpretation:  no evidence of acute ischemia ? ?PROCEDURES: ?Critical Care performed: Yes, see critical care procedure note(s) ?.1-3 Lead EKG Interpretation ?Performed by: Naaman Plummer, MD ?Authorized by: Naaman Plummer, MD  ? ?  Interpretation: abnormal   ?  ECG rate:  143 ?  ECG rate assessment: tachycardic   ?  Rhythm: sinus rhythm   ?  Ectopy: none   ?  Conduction: normal   ?CRITICAL CARE ?Performed by: Naaman Plummer ? ?Total critical care time: 33 minutes ? ?Critical care time was exclusive of separately billable procedures and treating other patients. ? ?Critical care was necessary to treat or prevent imminent or life-threatening deterioration. ? ?Critical care was time spent personally by me on the following activities: development of treatment plan with patient and/or surrogate as well as nursing, discussions with  consultants, evaluation of patient's response to treatment, examination of patient, obtaining history from patient or surrogate, ordering and performing treatments and interventions, ordering and review of laboratory studies, ordering and review of radiographic studies, pulse oximetry and re-evaluation of patient's condition. ? ?MEDICATIONS ORDERED IN ED: ?Medications  ?EPINEPHrine (ADRENALIN) 5 mg in NS 250 mL (0.02 mg/mL) premix infusion (3 mcg/min Intravenous New Bag/Given 12/07/21 1938)  ?docusate sodium (COLACE) capsule 100 mg (has no administration in time range)  ?polyethylene glycol (MIRALAX / GLYCOLAX) packet 17 g (has no administration in time range)  ?heparin injection 5,000 Units (has no administration in time range)  ?pantoprazole (PROTONIX) injection 40 mg (has no administration in time range)  ?acetaminophen (TYLENOL) tablet 650 mg (has no administration in time range)  ?ondansetron (ZOFRAN) injection 4 mg (has no administration in time range)  ?albuterol (PROVENTIL) (2.5 MG/3ML) 0.083% nebulizer solution 2.5 mg (has no administration in time range)  ?ondansetron [facility]) injection 4 mg (4 mg Intravenous Given 12/07/21 1902)  ?EPINEPHrine (ADRENALIN) 0.3 mg (0.3 mg Intravenous Given 12/07/21 1946)  ?EPINEPHrine (ADRENALIN) 1 MG/10ML injection (0.3 mg  Given 12/07/21 1901)  ?sodium chloride 0.9 % bolus 1,000 mL (1,000 mLs Intravenous New Bag/Given 12/07/21 1911)  ? ?IMPRESSION / MDM / ASSESSMENT AND PLAN / ED COURSE  ?I reviewed the triage vital signs and the nursing notes. ?             ?               ?Differential diagnosis includes, but is not limited to, anaphylactic shock, airway compromise, septic shock, ACS, seizure ?The patient is on the cardiac monitor to evaluate for evidence of arrhythmia and/or significant heart rate changes. ?Patient is a 41 year old female who presents with signs and symptoms of anaphylaxis after first dose of biologic intramuscular injection today.  Patient initially showed  improvement after 2 doses of intramuscular epinephrine however symptoms recurred within approximately 1 hour after emergency department course the patient was given a 0.3 mg bolus of IV epinephrine.  Patient was then placed on an epinephrine drip as symptoms recurred despite IM epinephrine.  Patient showed persistent sinus tachycardia throughout her emergency department course.  Laboratory evaluation significant for mild leukocytosis to 13, troponin mildly elevated at 20, glucose elevated to 220.  Per review of medical records, patient has never had an anaphylactic reaction to the medication similar to this.  Per patient's visit on 11/08/2021 with hematology ? ?Given patient's need for IV epinephrine and likely continuous titration given the half-life of this medication is approximately 14 days, patient will need admission to the ICU for further monitoring. ? ?I spoke to Domingo Pulse in the  critical care unit who relates to accept this patient for direct service for further evaluation management ? ?Dispo: Admit to the ICU ? ?  ?FINAL CLINICAL IMPRESSION(S) / ED DIAGNOSES  ? ?Final diagnoses:  ?Anaphylactic shock  ?Urticaria  ?Nausea and vomiting, unspecified vomiting type  ? ?Rx / DC Orders  ? ?ED Discharge Orders   ? ? None  ? ?  ? ?Note:  This document was prepared using Dragon voice recognition software and may include unintentional dictation errors. ?  ?Naaman Plummer, MD ?12/07/21 1956 ? ?

## 2021-12-07 NOTE — ED Notes (Signed)
Pt Spo2 at 89% RA. Pt placed on 2L Max due to increased SOB and Wheezing, pt O2 now at 95% ?

## 2021-12-07 NOTE — Procedures (Signed)
Central Venous Catheter Insertion Procedure Note ? ?Domingo DimesJanet N Packham  ?[id number]  ?11-03-1980 ? ?Date:12/07/21  ?Time:11:50 PM  ? ?Provider Performing:Karima Carrell L Rust-Chester  ? ?Procedure: Insertion of Non-tunneled Central Venous Catheter(36556) with US guidance ([id number])  ? ?Indication(s) ?Medication administration ? ?Consent ?Risks of the procedure as well as the alternatives and risks of each were explained to the patient and/or caregiver.  Consent for the procedure was obtained and is signed in the bedside chart ? ?Anesthesia ?Topical only with 1% lidocaine  & fentanyl IV ? ?Timeout ?Verified patient identification, verified procedure, site/side was marked, verified correct patient position, special equipment/implants available, medications/allergies/relevant history reviewed, required imaging and test results available. ? ?Sterile Technique ?Maximal sterile technique including full sterile barrier drape, hand hygiene, sterile gown, sterile gloves, mask, hair covering, sterile ultrasound probe cover (if used). ? ?Procedure Description ?Area of catheter insertion was cleaned with chlorhexidine and draped in sterile fashion.  With real-time ultrasound guidance a central venous catheter was placed into the right femoral vein. Nonpulsatile blood flow and easy flushing noted in all ports.  The catheter was sutured in place and sterile dressing applied. ? ?Complications/Tolerance ?None; patient tolerated the procedure well. ?Chest X-ray is ordered to verify placement for internal jugular or subclavian cannulation.   Chest x-ray is not ordered for femoral cannulation. ? ?EBL ?Minimal ? ?Specimen(s) ?None ? ? ?Cheryll CockayneBritton Lee Rust-Chester, AGACNP-BC ?Acute Care Nurse Practitioner ?[facility]  ? ?[phone] / [phone] ?Please see Amion for pager details.  ? ? ?

## 2021-12-07 NOTE — ED Triage Notes (Addendum)
Pt to ED via [facility] from home for allergic reaction. Per EMS pt had 2 Cimzia IM injections at 4pm today, for arthritis. EMS arrived around 5:45pm. Pt c/o SOB, pt airway intact pt placed on 4L [place] per EMS. Pt has generalized hives to extremities and Chest. Pt hands and feet cyanotic upon assessment, and cool to touch. Pt has generalized tremors. Pt also states she had episode of blurry vision. Denies CP  ? ?Pt is A&ox4.  ? ? ?EMS PTA Meds ?0.3 epi x2- last given 1759 ?50 mg benadryl  ? NS ? ?

## 2021-12-07 NOTE — Progress Notes (Addendum)
eLink Physician-Brief Progress Note ?Patient Name: Phyllis Myers ?DOB: 09/26/1981 ?MRN: [id number] ? ? ?Date of Service ? 12/07/2021  ?HPI/Events of Note ? 41 year old woman admitted to ICU with anaphylactic shock secondary to Certolizumab. Has history of RA. Prior PCN and NSAID allergy. Poison control contacted. Patient needed multiple epinephrine doses and given the long half life, planned for Epi drip. No distress on camera, current SBP and HR are both in 120s. O2 is 95 and speaking clearly. ER note reviewed. PCCM admitting.   ?eICU Interventions ? Epinephrine, steroids, benadryl and follow serial labs and hemodynamics and continue follow up with poison control. Will need to be placed on prophylactic anticoagulation after invasive lines placed. Call E link if needed.   ? ? ? ?Intervention Category ?Major Interventions: Respiratory failure - evaluation and management ?Evaluation Type: New Patient Evaluation ? ?Clemetine Marker Laityn Bensen ?12/07/2021, 9:27 PM ? ?Addendum at 6:20 am ?Discussed with ICU NP who notified me of rising lactate ?Patient needed additional epinephrine pushes due to recurrent anaphylactic reaction and still continues to remain on the epi drip ?Her HR is down. BP is stable. No reported belly or chest pain.  ?Check CMP - has history of hemochromatosis so could be delayed clearance from impaired liver function ?However epinephrine itself can cause elevated LA ?Would continue to trend and if next check also is high, then may need to scan the belly to look for ischemic findings (history of RA) ?

## 2021-12-07 NOTE — H&P (Addendum)
NAME:  Phyllis Myers, MRN:  [id number], DOB:  12/02/1980, LOS: 0 ADMISSION DATE:  12/07/2021, CONSULTATION DATE:  12/07/21 REFERRING MD:  Dr. Vicente Males, CHIEF COMPLAINT:   Allergic reaction  History of Present Illness:  41 yo F presenting to [facility] ED via EMS with anaphylaxis after an injection of a new biologic, Cimzia- Certolizumab, via IM on 12/07/21 at 4 pm. Patient returned home and about 1.5 hours later broke out into an itchy red rash, with complaints of loss of vision, wheezing, nausea & light headedness. She is not sure what happened but fell onto her kitchen floor. Per ED documentation, EMS found the patient hypotensive in the 60's/40's. She received 2 IM doses of epinephrine with improvement of some symptoms. Patient transported to ED without incident. ED course: After arrival patient's symptoms began to return within one hour. She received a bolus of epinephrine IV continued with epinephrine drip to control symptoms. Medications given: (with EMS 2 IM doses of epi & 300 mL NS), 1 L NS bolus, epi bolus 0.3 mg & epinephrine drip initiated, Zofran IV 4 mg Initial Vitals: 97.4, tachypneic 26, tachycardic 125, BP 148/108 & SpO2 99% on room air Significant labs: (Labs/ Imaging personally reviewed) I, Cheryll Cockayne Rust-Chester, AGACNP-BC, personally viewed and interpreted this ECG. EKG Interpretation: Date: 12/07/21, EKG Time: 19:03, Rate: 130,  Rhythm: Sinus Tachycardia, QRS Axis:  normal, Intervals: normal,  ST/T Wave abnormalities: mild ST depression V3-V6, Narrative Interpretation: sinus tachycardia with mild ST depression in lateral leads Chemistry: Na+:136, K+: 3.8, BUN/Cr.: 9/0.72, Serum CO2/ AG: 13/19, AST: 48, total bili: 1.5 Hematology: WBC: 13.4, Hgb: 15.6,  Troponin: 20, Lactic: pending, COVID-19 & Influenza A/B: negative VBG: pending  PCCM consulted for admission due to Anaphylaxis reaction requiring continuous epinephrine infusion for symptom control.  Pertinent  Medical History   Rheumatoid arthritis GAD Depression Hemochromatosis HTN GERD  Significant Hospital Events: Including procedures, antibiotic start and stop dates in addition to other pertinent events   12/07/21: Admit to ICU with anaphylaxis s/t new biologic administration for RA requiring continuous epinephrine drip  Interim History / Subjective:  Patient alert & responsive with continued headache and improved but slightly blurry vision. Within the hour however the patient's hives returned covering her chest, bilateral thighs & face worsening as time progressed.  Objective   Blood pressure 108/78, pulse (!) 149, temperature 98.2 F (36.8 C), temperature source Oral, resp. rate (!) 26, height  (1.651 m), weight 79.4 kg, SpO2 95 %.       No intake or output data in the 24 hours ending 12/07/21 1959 Filed Weights   12/07/21 1826  Weight: 79.4 kg    Examination: General: Adult female, critically ill, lying in bed, uncomfortable appearing HEENT: MM pink/moist, anicteric, atraumatic, neck supple Neuro: A&O x 4, able to follow commands, PERRL +4, MAE CV: s1s2 RRR, ST on monitor, no r/m/g Pulm: Regular, non labored on RA , breath sounds clear-BUL & clear/diminished-BLL GI: soft, rounded, non tender, bs x 4 GU: pure wick in place with clear yellow urine Skin: initially no rash noted then after some time it re-occurred    Extremities: warm/dry, pulses + 2 R/P, trace edema noted  Resolved Hospital Problem list     Assessment & Plan:  Suspected Anaphylactic Reaction secondary to first time exposure to Cimzia (Certolizumab) Blurred Vision in the setting of anaphylaxis, query optic neuritis? Discussed case with [facility] poison control via telephone, received recommendations from Dr. Algis Downs. [place] that are outlined below. Peak time  for this medication is 54-171 hours.  - Vision is improving with treatment, continue to monitor > consider optometry consult if needed. - f/u CTH (PTA mechanical fall) -  solu-medrol 125 mg x 1, reassess in 4 hours > if still symptomatic repeat solu-medrol 40 mg Q 4 h as needed - benadryl 50 mg IV Q 4 h - continue epinephrine drip, titrate for symptomatic control, will need Central line placement - continue IVF, add 2nd bolus of LR to complete 30 ml/kg - STAT lactic & VBG > prior to 2nd bolus - bronchodilators & ICS PRN with respiratory symptoms - Zofran IV PRN - monitor LFT's & renal function due to risk for nephrotic syndrome/ renal failure, hepatitis - NO BETA BLOCKERS  Anion Gap Metabolic Acidosis Risk for AKI in the setting of anaphylaxis - Strict I/O's: alert provider if UOP < 0.5 mL/kg/hr - IVF hydration as above - Daily BMP, replace electrolytes PRN - f/u lactic & VBG, per CDC IVF resuscitation preferred over sodium bicarbonate administration  Elevated Troponin secondary to N-STEMI vs demand ischemia, suspect demand ischemia - Trend troponins  - consider Echocardiogram & systemic anticoagulation if needed - f/u EKG in AM - NO BETA BLOCKERS per CDC for tachycardia  - continuous cardiac monitoring  Risk for Steroid Induced Hyperglycemia - Monitor CBG Q 4 hours - SSI moderate dosing - target range while in ICU: 140-180 - follow ICU hyper/hypo-glycemia protocol  Mild Transaminitis secondary to suspected chronic fatty liver in the setting of anaphylaxis Pt's AST was 44 in 11/2021, at which time Dr. Gavin PottersKernodle referenced fatty liver disease on US - Trend hepatic function - Consider RUQ US/CT abdomen/pelvis depending on trend - avoid hepatotoxic agents  Depression GAD - continue outpatient valium PRN & Zoloft  Best Practice (right click and "Reselect all SmartList Selections" daily)  Diet/type: NPO w/ oral meds DVT prophylaxis: prophylactic heparin  GI prophylaxis: PPI Lines: Central line (will need placement) Foley:  N/A Code Status:  full code Last date of multidisciplinary goals of care discussion [12/07/21]  Labs   CBC: Recent Labs   Lab 12/07/21 1817  WBC 13.4*  NEUTROABS 9.3*  HGB 15.6*  HCT 45.7  MCV 93.8  PLT 299    Basic Metabolic Panel: Recent Labs  Lab 12/07/21 1817  NA 136  K 3.8  CL 104  CO2 13*  GLUCOSE 220*  BUN 9  CREATININE 0.72  CALCIUM 8.2*   GFR: Estimated Creatinine Clearance: 95.4 mL/min (by C-G formula based on SCr of 0.72 mg/dL). Recent Labs  Lab 12/07/21 1817  WBC 13.4*    Liver Function Tests: Recent Labs  Lab 12/07/21 1817  AST 48*  ALT 22  ALKPHOS 62  BILITOT 1.5*  PROT 6.8  ALBUMIN 3.5   No results for input(s): LIPASE, AMYLASE in the last 168 hours. No results for input(s): AMMONIA in the last 168 hours.  ABG    Component Value Date/Time   TCO2 24 12/14/2019 0635     Coagulation Profile: No results for input(s): INR, PROTIME in the last 168 hours.  Cardiac Enzymes: No results for input(s): CKTOTAL, CKMB, CKMBINDEX, TROPONINI in the last 168 hours.  HbA1C: Hgb A1c MFr Bld  Date/Time Value Ref Range Status  01/31/2021 04:45 PM 4.6 4.6 - 6.5 % Final    Comment:    Glycemic Control Guidelines for People with Diabetes:Non Diabetic:  <6%Goal of Therapy: <7%Additional Action Suggested:  >8%     CBG: No results for input(s): GLUCAP in the last  168 hours.  Review of Systems: Positives in BOLD  Gen: Denies fever, chills, weight change, fatigue, night sweats HEENT: Denies blurred vision, double vision, hearing loss, tinnitus, sinus congestion, rhinorrhea, sore throat, neck stiffness, dysphagia PULM: Denies shortness of breath, cough, sputum production, hemoptysis, wheezing CV: Denies chest pain, edema, orthopnea, paroxysmal nocturnal dyspnea, palpitations GI: Denies abdominal pain, nausea, vomiting, diarrhea, hematochezia, melena, constipation, change in bowel habits GU: Denies dysuria, hematuria, polyuria, oliguria, urethral discharge Endocrine: Denies hot or cold intolerance, polyuria, polyphagia or appetite change Derm: Denies rash, dry skin, scaling  or peeling skin change Heme: Denies easy bruising, bleeding, bleeding gums Neuro: Denies headache, numbness, weakness, slurred speech, loss of memory or consciousness  Past Medical History:  She,  has a past medical history of Depression, major, single episode, mild (HCC) (12/20/2016), Generalized anxiety disorder (09/08/2013), GERD (gastroesophageal reflux disease), Hemochromatosis, Hypertension, Panic disorder (07/21/2014), Pneumonia, 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   Surgical History:   Past Surgical History:  Procedure Laterality Date   ABDOMINAL EXPOSURE N/A 09/06/2021   Procedure: ABDOMINAL EXPOSURE;  Surgeon: Annice Needy, MD;  Location: [facility] ORS;  Service: Vascular;  Laterality: N/A;   ANTERIOR LATERAL LUMBAR FUSION WITH PERCUTANEOUS SCREW 1 LEVEL N/A 09/06/2021   Procedure: L5-S1 ANTERIOR LATERAL LUMBAR FUSION WITH PERCUTANEOUS SCREW;  Surgeon: [name], MD;  Location: [facility] ORS;  Service: Neurosurgery;  Laterality: N/A;   PERONEAL NERVE DECOMPRESSION Left 12/14/2019   Procedure: PERONEAL NERVE DECOMPRESSION;  Surgeon: [name], MD;  Location: [facility] ORS;  Service: Neurosurgery;  Laterality: Left;   WISDOM TOOTH EXTRACTION       Social History:   reports that she has never smoked. She has never used smokeless tobacco. She reports current alcohol use of about 4.0 standard drinks per week. She reports that she does not use drugs.   Family History:  Her family history includes Cardiomyopathy in her mother; Heart disease in her maternal grandfather, mother, and paternal grandfather; Heart disease (age of onset: 76) in her father; Pulmonary Hypertension in her mother.   Allergies Allergies  Allergen Reactions   Amoxicillin     Muscle pain   Naproxen Sodium Rash    Heavy doses     Home Medications  Prior to Admission medications   Medication Sig Start Date End Date Taking? Authorizing Provider  apixaban (ELIQUIS) 2.5 MG TABS  tablet Take 1 tablet (2.5 mg total) by mouth 2 (two) times daily. 09/09/21   [name], MD  celecoxib (CELEBREX) 100 MG capsule Take 1 capsule (100 mg total) by mouth 2 (two) times daily. 09/09/21 12/08/21  [name], MD  diazepam (VALIUM) 5 MG tablet TAKE 1 TABLET(5 MG) BY MOUTH EVERY 8 HOURS AS NEEDED FOR ANXIETY 06/14/21   [name], [name], MD  levonorgestrel [facility]) 19.5 MG IUD Kyleena 17.5 mcg/24 hrs (27yrs) 19.5mg  intrauterine device 12/02/17   [provider]  methocarbamol (ROBAXIN) 500 MG tablet Take 1 tablet (500 mg total) by mouth every 6 (six) hours as needed for muscle spasms. 09/09/21   [name], MD  omeprazole (PRILOSEC) 40 MG capsule TAKE 1 CAPSULE(40 MG) BY MOUTH DAILY 07/24/21   [name], [name], MD  oxyCODONE (OXY IR/ROXICODONE) 5 MG immediate release tablet Take 1 tablet (5 mg total) by mouth every 3 (three) hours as needed for moderate pain ((score 4 to 6)). 09/09/21   [name], MD  senna-docusate (SENOKOT-S) 8.6-50 MG tablet Take 1 tablet by mouth 2 (two) times daily as needed for mild constipation. 09/09/21  [name], MD  sertraline (ZOLOFT) 25 MG tablet Take 1 tablet (25 mg total) by mouth daily. 01/04/20   [name], MD     Critical care time: 65 minutes       Betsey Holiday, AGACNP-BC Acute Care Nurse Practitioner [facility]   [phone] / [phone] Please see Amion for pager details.

## 2021-12-08 ENCOUNTER — Inpatient Hospital Stay: Payer: 59

## 2021-12-08 ENCOUNTER — Encounter: Payer: Self-pay | Admitting: Pulmonary Disease

## 2021-12-08 LAB — CBC WITH DIFFERENTIAL/PLATELET
Abs Immature Granulocytes: 0.08 10*3/uL — ABNORMAL HIGH (ref 0.00–0.07)
Basophils Absolute: 0 10*3/uL (ref 0.0–0.1)
Basophils Relative: 0 %
Eosinophils Absolute: 0 10*3/uL (ref 0.0–0.5)
Eosinophils Relative: 0 %
HCT: 39.4 % (ref 36.0–46.0)
Hemoglobin: 13.6 g/dL (ref 12.0–15.0)
Immature Granulocytes: 1 %
Lymphocytes Relative: 3 %
Lymphs Abs: 0.3 10*3/uL — ABNORMAL LOW (ref 0.7–4.0)
MCH: 32.3 pg (ref 26.0–34.0)
MCHC: 34.5 g/dL (ref 30.0–36.0)
MCV: 93.6 fL (ref 80.0–100.0)
Monocytes Absolute: 0.1 10*3/uL (ref 0.1–1.0)
Monocytes Relative: 1 %
Neutro Abs: 9.9 10*3/uL — ABNORMAL HIGH (ref 1.7–7.7)
Neutrophils Relative %: 95 %
Platelets: 230 10*3/uL (ref 150–400)
RBC: 4.21 MIL/uL (ref 3.87–5.11)
RDW: 13.7 % (ref 11.5–15.5)
WBC: 10.3 10*3/uL (ref 4.0–10.5)
nRBC: 0 % (ref 0.0–0.2)

## 2021-12-08 LAB — COMPREHENSIVE METABOLIC PANEL
ALT: 23 U/L (ref 0–44)
AST: 41 U/L (ref 15–41)
Albumin: 3.3 g/dL — ABNORMAL LOW (ref 3.5–5.0)
Alkaline Phosphatase: 52 U/L (ref 38–126)
Anion gap: 15 (ref 5–15)
BUN: 8 mg/dL (ref 6–20)
CO2: 16 mmol/L — ABNORMAL LOW (ref 22–32)
Calcium: 8.1 mg/dL — ABNORMAL LOW (ref 8.9–10.3)
Chloride: 106 mmol/L (ref 98–111)
Creatinine, Ser: 0.92 mg/dL (ref 0.44–1.00)
GFR, Estimated: >60 mL/min (ref >60)
Glucose, Bld: 252 mg/dL — ABNORMAL HIGH (ref 70–99)
Potassium: 3.1 mmol/L — ABNORMAL LOW (ref 3.5–5.1)
Sodium: 137 mmol/L (ref 135–145)
Total Bilirubin: 1 mg/dL (ref 0.3–1.2)
Total Protein: 6 g/dL — ABNORMAL LOW (ref 6.5–8.1)

## 2021-12-08 LAB — RENAL FUNCTION PANEL
Albumin: 3 g/dL — ABNORMAL LOW (ref 3.5–5.0)
Anion gap: 8 (ref 5–15)
BUN: 6 mg/dL (ref 6–20)
CO2: 22 mmol/L (ref 22–32)
Calcium: 8.4 mg/dL — ABNORMAL LOW (ref 8.9–10.3)
Chloride: 110 mmol/L (ref 98–111)
Creatinine, Ser: 0.62 mg/dL (ref 0.44–1.00)
GFR, Estimated: >60 mL/min (ref >60)
Glucose, Bld: 243 mg/dL — ABNORMAL HIGH (ref 70–99)
Phosphorus: 1.2 mg/dL — ABNORMAL LOW (ref 2.5–4.6)
Potassium: 3.9 mmol/L (ref 3.5–5.1)
Sodium: 140 mmol/L (ref 135–145)

## 2021-12-08 LAB — GLUCOSE, CAPILLARY
Glucose-Capillary: 277 mg/dL — ABNORMAL HIGH (ref 70–99)
Glucose-Capillary: 242 mg/dL — ABNORMAL HIGH (ref 70–99)
Glucose-Capillary: 207 mg/dL — ABNORMAL HIGH (ref 70–99)
Glucose-Capillary: 247 mg/dL — ABNORMAL HIGH (ref 70–99)
Glucose-Capillary: 195 mg/dL — ABNORMAL HIGH (ref 70–99)
Glucose-Capillary: 191 mg/dL — ABNORMAL HIGH (ref 70–99)
Glucose-Capillary: 176 mg/dL — ABNORMAL HIGH (ref 70–99)

## 2021-12-08 LAB — LACTIC ACID, PLASMA
Lactic Acid, Venous: 5.1 mmol/L (ref 0.5–1.9)
Lactic Acid, Venous: 5.5 mmol/L (ref 0.5–1.9)
Lactic Acid, Venous: 8.9 mmol/L (ref 0.5–1.9)

## 2021-12-08 LAB — PHOSPHORUS: Phosphorus: 2.6 mg/dL (ref 2.5–4.6)

## 2021-12-08 LAB — PREGNANCY, URINE: Preg Test, Ur: NEGATIVE

## 2021-12-08 LAB — MAGNESIUM
Magnesium: 1.2 mg/dL — ABNORMAL LOW (ref 1.7–2.4)
Magnesium: 1.6 mg/dL — ABNORMAL LOW (ref 1.7–2.4)
Magnesium: 2 mg/dL (ref 1.7–2.4)

## 2021-12-08 LAB — HIV ANTIBODY (ROUTINE TESTING W REFLEX): HIV Screen 4th Generation wRfx: NONREACTIVE

## 2021-12-08 LAB — TROPONIN I (HIGH SENSITIVITY)
Troponin I (High Sensitivity): 71 ng/L — ABNORMAL HIGH (ref <18)
Troponin I (High Sensitivity): 20 ng/L — ABNORMAL HIGH (ref <18)

## 2021-12-08 LAB — HEMOGLOBIN A1C
Hgb A1c MFr Bld: 5.2 % (ref 4.8–5.6)
Mean Plasma Glucose: 103 mg/dL

## 2021-12-08 LAB — PROCALCITONIN: Procalcitonin: 0.24 ng/mL

## 2021-12-08 MED ORDER — POTASSIUM CHLORIDE CRYS ER 20 MEQ PO TBCR
40.0000 meq | EXTENDED_RELEASE_TABLET | Freq: Once | ORAL | Status: DC
  Filled 2021-12-08: qty 2

## 2021-12-08 MED ORDER — SODIUM PHOSPHATES 45 MMOLE/15ML IV SOLN
15.0000 mmol | Freq: Once | INTRAVENOUS | Status: AC
  Administered 2021-12-08: 15 mmol via INTRAVENOUS
  Filled 2021-12-08: qty 5

## 2021-12-08 MED ORDER — POTASSIUM CHLORIDE 10 MEQ/50ML IV SOLN
10.0000 meq | INTRAVENOUS | Status: AC
  Administered 2021-12-08 (×4): 10 meq via INTRAVENOUS
  Filled 2021-12-08 (×5): qty 50

## 2021-12-08 MED ORDER — THIAMINE HCL 100 MG PO TABS
100.0000 mg | ORAL_TABLET | Freq: Every day | ORAL | Status: DC
  Administered 2021-12-09: 100 mg via ORAL
  Filled 2021-12-08 (×2): qty 1

## 2021-12-08 MED ORDER — SERTRALINE HCL 50 MG PO TABS: 25.0000 mg | ORAL_TABLET | Freq: Every day | ORAL | Status: DC

## 2021-12-08 MED ORDER — FOLIC ACID 1 MG PO TABS
1.0000 mg | ORAL_TABLET | Freq: Every day | ORAL | Status: DC
  Administered 2021-12-08 – 2021-12-13 (×5): 1 mg via ORAL
  Filled 2021-12-08 (×6): qty 1

## 2021-12-08 MED ORDER — INSULIN ASPART 100 UNIT/ML IJ SOLN
0.0000 [IU] | Freq: Every day | INTRAMUSCULAR | Status: DC
  Filled 2021-12-08: qty 1

## 2021-12-08 MED ORDER — DIAZEPAM 5 MG PO TABS: 5.0000 mg | ORAL_TABLET | Freq: Four times a day (QID) | ORAL | Status: DC | PRN

## 2021-12-08 MED ORDER — DIAZEPAM 5 MG/ML IJ SOLN
5.0000 mg | INTRAMUSCULAR | Status: DC | PRN
  Administered 2021-12-09: 20 mg via INTRAVENOUS
  Administered 2021-12-09: 10 mg via INTRAVENOUS
  Administered 2021-12-09 (×2): 20 mg via INTRAVENOUS
  Administered 2021-12-09 – 2021-12-10 (×2): 10 mg via INTRAVENOUS
  Administered 2021-12-10: 20 mg via INTRAVENOUS
  Administered 2021-12-10 (×3): 10 mg via INTRAVENOUS
  Administered 2021-12-11: 5 mg via INTRAVENOUS
  Filled 2021-12-08 (×2): qty 4
  Filled 2021-12-08 (×2): qty 2
  Filled 2021-12-08: qty 4
  Filled 2021-12-08: qty 2
  Filled 2021-12-08 (×2): qty 4
  Filled 2021-12-08 (×2): qty 2

## 2021-12-08 MED ORDER — MAGNESIUM SULFATE 4 GM/100ML IV SOLN
4.0000 g | Freq: Once | INTRAVENOUS | Status: AC
  Administered 2021-12-08: 4 g via INTRAVENOUS
  Filled 2021-12-08: qty 100

## 2021-12-08 MED ORDER — DIAZEPAM 5 MG PO TABS
5.0000 mg | ORAL_TABLET | ORAL | Status: AC
  Filled 2021-12-08: qty 1

## 2021-12-08 MED ORDER — INSULIN ASPART 100 UNIT/ML IJ SOLN
0.0000 [IU] | Freq: Three times a day (TID) | INTRAMUSCULAR | Status: DC
  Administered 2021-12-08: 5 [IU] via SUBCUTANEOUS
  Administered 2021-12-08 – 2021-12-09 (×3): 3 [IU] via SUBCUTANEOUS
  Administered 2021-12-09 – 2021-12-13 (×4): 2 [IU] via SUBCUTANEOUS
  Filled 2021-12-08 (×8): qty 1

## 2021-12-08 MED ORDER — METHYLPREDNISOLONE SODIUM SUCC 125 MG IJ SOLR
60.0000 mg | INTRAMUSCULAR | Status: AC
Start: 2021-12-08 — End: 2021-12-08
  Administered 2021-12-08: 60 mg via INTRAVENOUS
  Filled 2021-12-08: qty 2

## 2021-12-08 MED ORDER — POTASSIUM CHLORIDE 20 MEQ PO PACK
40.0000 meq | PACK | Freq: Once | ORAL | Status: AC
  Administered 2021-12-08: 40 meq via ORAL
  Filled 2021-12-08: qty 2

## 2021-12-08 MED ORDER — DIAZEPAM 5 MG PO TABS
5.0000 mg | ORAL_TABLET | Freq: Four times a day (QID) | ORAL | Status: DC | PRN
  Administered 2021-12-08 (×2): 5 mg via ORAL
  Filled 2021-12-08: qty 1

## 2021-12-08 MED ORDER — LACTATED RINGERS IV BOLUS
1000.0000 mL | Freq: Once | INTRAVENOUS | Status: AC
  Administered 2021-12-08: 1000 mL via INTRAVENOUS

## 2021-12-08 MED ORDER — LORAZEPAM 2 MG PO TABS: 0.0000 mg | ORAL_TABLET | Freq: Three times a day (TID) | ORAL | Status: DC

## 2021-12-08 MED ORDER — METHYLPREDNISOLONE SODIUM SUCC 125 MG IJ SOLR
60.0000 mg | Freq: Two times a day (BID) | INTRAMUSCULAR | Status: DC
  Administered 2021-12-08 – 2021-12-11 (×6): 60 mg via INTRAVENOUS
  Filled 2021-12-08 (×6): qty 2

## 2021-12-08 MED ORDER — MAGNESIUM SULFATE 2 GM/50ML IV SOLN
2.0000 g | Freq: Once | INTRAVENOUS | Status: AC
  Administered 2021-12-08: 2 g via INTRAVENOUS
  Filled 2021-12-08: qty 50

## 2021-12-08 MED ORDER — ADULT MULTIVITAMIN W/MINERALS CH
1.0000 | ORAL_TABLET | Freq: Every day | ORAL | Status: DC
  Administered 2021-12-08 – 2021-12-13 (×6): 1 via ORAL
  Filled 2021-12-08 (×6): qty 1

## 2021-12-08 MED ORDER — THIAMINE HCL 100 MG/ML IJ SOLN
100.0000 mg | Freq: Every day | INTRAMUSCULAR | Status: DC
  Administered 2021-12-08: 100 mg via INTRAVENOUS
  Filled 2021-12-08: qty 2

## 2021-12-08 MED ORDER — LORAZEPAM 2 MG PO TABS
0.0000 mg | ORAL_TABLET | ORAL | Status: DC
  Administered 2021-12-08: 2 mg via ORAL
  Administered 2021-12-09: 4 mg via ORAL
  Administered 2021-12-09: 2 mg via ORAL
  Administered 2021-12-09: 4 mg via ORAL
  Filled 2021-12-08: qty 2
  Filled 2021-12-08: qty 1
  Filled 2021-12-08: qty 2
  Filled 2021-12-08: qty 1

## 2021-12-08 NOTE — Plan of Care (Signed)

## 2021-12-08 NOTE — Consult Note (Addendum)
PHARMACY CONSULT NOTE - FOLLOW UP ? ?Pharmacy Consult for Electrolyte Monitoring and Replacement  ? ?Recent Labs: ?Potassium (mmol/L)  ?Date Value  ?12/08/2021 3.1 (L)  ?10/23/2012 3.3 (L)  ? ?Magnesium (mg/dL)  ?Date Value  ?12/08/2021 1.2 (L)  ? ?Calcium (mg/dL)  ?Date Value  ?12/08/2021 8.1 (L)  ? ?Calcium, Total (mg/dL)  ?Date Value  ?10/23/2012 8.8  ? ?Albumin (g/dL)  ?Date Value  ?12/08/2021 3.3 (L)  ?10/23/2012 4.0  ? ?Phosphorus (mg/dL)  ?Date Value  ?12/08/2021 2.6  ? ?Sodium (mmol/L)  ?Date Value  ?12/08/2021 137  ?10/23/2012 137  ? ? ? ?Assessment: ?41 year old woman w/ h/o RA admitted to ICU with anaphylactic shock 2/2 Certolizumab Pegol (cimzia) Poison control contacted. Patient needed multiple epinephrine doses and given the long half life of cimzia will need extended supportive care. Transferred to CCU for further mgmt. Pharmacy consulted for electrolytes. ? ? ? ?Goal of Therapy:  ?Lytes WNL ? ?Plan: Scr 0.72>0.92>0.62, AG: 19>15>8 ?K: 3.8>3.1>3.9 - MD ordered KCL tab x1 and KCL IV q1h x4 doses. ?Mg: 1.7>1.2 - MD ordered MgSO4 4g x1 and MgSO4 2g x1 ?Phos: 4.3>2.6>1.2 - Replete with NaPhos IV x1 ?CTM w/ daily AM labs and replace PRN ? ?Martyn Malay ,PharmD ?Clinical Pharmacist ?12/08/2021 9:26 AM ? ?

## 2021-12-08 NOTE — Progress Notes (Signed)
NAME:  MISCHELL BRANFORD, MRN:  [id number], DOB:  1980-10-08, LOS: 1 ADMISSION DATE:  12/07/2021, CONSULTATION DATE:  12/07/21 REFERRING MD:  Dr. Vicente Males, CHIEF COMPLAINT:   Allergic reaction  History of Present Illness:  41 yo F presenting to [facility] ED via EMS with anaphylaxis after an injection of a new biologic, Cimzia- Certolizumab, via IM on 12/07/21 at 4 pm. Patient returned home and about 1.5 hours later broke out into an itchy red rash, with complaints of loss of vision, wheezing, nausea & light headedness. She is not sure what happened but fell onto her kitchen floor. Per ED documentation, EMS found the patient hypotensive in the 60's/40's. She received 2 IM doses of epinephrine with improvement of some symptoms. Patient transported to ED without incident. ED course: After arrival patient's symptoms began to return within one hour. She received a bolus of epinephrine IV continued with epinephrine drip to control symptoms. Medications given: (with EMS 2 IM doses of epi & 300 mL NS), 1 L NS bolus, epi bolus 0.3 mg & epinephrine drip initiated, Zofran IV 4 mg Initial Vitals: 97.4, tachypneic 26, tachycardic 125, BP 148/108 & SpO2 99% on room air Significant labs: (Labs/ Imaging personally reviewed) I, Cheryll Cockayne Rust-Chester, AGACNP-BC, personally viewed and interpreted this ECG. EKG Interpretation: Date: 12/07/21, EKG Time: 19:03, Rate: 130,  Rhythm: Sinus Tachycardia, QRS Axis:  normal, Intervals: normal,  ST/T Wave abnormalities: mild ST depression V3-V6, Narrative Interpretation: sinus tachycardia with mild ST depression in lateral leads Chemistry: Na+:136, K+: 3.8, BUN/Cr.: 9/0.72, Serum CO2/ AG: 13/19, AST: 48, total bili: 1.5 Hematology: WBC: 13.4, Hgb: 15.6,  Troponin: 20, Lactic: pending, COVID-19 & Influenza A/B: negative VBG: pending  PCCM consulted for admission due to Anaphylaxis reaction requiring continuous epinephrine infusion for symptom control.  12/08/21- patient seen and  evaluated at beside , significantly improved, s/p rheumatology evaluation, anticipate several days of therapy due to prolonged half life.   Pertinent  Medical History  Rheumatoid arthritis GAD Depression Hemochromatosis HTN GERD  Significant Hospital Events: Including procedures, antibiotic start and stop dates in addition to other pertinent events   12/07/21: Admit to ICU with anaphylaxis s/t new biologic administration for RA requiring continuous epinephrine drip  Interim History / Subjective:  Patient alert & responsive with continued headache and improved but slightly blurry vision. Within the hour however the patient's hives returned covering her chest, bilateral thighs & face worsening as time progressed.  Objective   Blood pressure 114/69, pulse (!) 101, temperature 99.4 F (37.4 C), temperature source Oral, resp. rate (!) 25, height  (1.651 m), weight 82.6 kg, SpO2 100 %.        Intake/Output Summary (Last 24 hours) at 12/08/2021 0908 Last data filed at 12/08/2021 0900 Gross per 24 hour  Intake 946.6 ml  Output 1400 ml  Net -453.4 ml   Filed Weights   12/07/21 1826 12/07/21 2100  Weight: 79.4 kg 82.6 kg    Examination: General: Adult female NAD HEENT: MM pink/moist, anicteric, atraumatic, neck supple Neuro: A&O x 4, able to follow commands, PERRL +4, MAE CV: s1s2 RRR, ST on monitor, no r/m/g Pulm: Regular, non labored on RA , breath sounds clear-BUL & clear/diminished-BLL GI: soft, rounded, non tender, bs x 4 GU: pure wick in place with clear yellow urine Skin: mild rash over LE b/l Extremities: warm/dry, pulses + 2 R/P, trace edema noted  Resolved Hospital Problem list     Assessment & Plan:  Suspected Anaphylactic Reaction secondary to  first time exposure to Cimzia (Certolizumab) Blurred Vision in the setting of anaphylaxis, query optic neuritis? Discussed case with  poison control via telephone, received recommendations from Dr. Algis Downs. [place] that are  outlined below. Peak time for this medication is 54-171 hours.  - Vision is improving with treatment, continue to monitor > consider optometry consult if needed. - f/u CTH (PTA mechanical fall) - solu-medrol 125 mg x 1, reassess in 4 hours > if still symptomatic repeat solu-medrol 40 mg Q 4 h as needed - benadryl 50 mg IV Q 4 h - continue epinephrine drip, titrate for symptomatic control, will need Central line placement - continue IVF, add 2nd bolus of LR to complete 30 ml/kg - STAT lactic & VBG > prior to 2nd bolus - bronchodilators & ICS PRN with respiratory symptoms - Zofran IV PRN - monitor LFT's & renal function due to risk for nephrotic syndrome/ renal failure, hepatitis - NO BETA BLOCKERS -rheumatotlogy consult - appreciate input   Anion Gap Metabolic Acidosis Risk for AKI in the setting of anaphylaxis - Strict I/O's: alert provider if UOP < 0.5 mL/kg/hr - IVF hydration as above - Daily BMP, replace electrolytes PRN - f/u lactic & VBG, per CDC IVF resuscitation preferred over sodium bicarbonate administration  Elevated Troponin secondary to N-STEMI vs demand ischemia, suspect demand ischemia - Trend troponins  - consider Echocardiogram & systemic anticoagulation if needed - f/u EKG in AM - NO BETA BLOCKERS per CDC for tachycardia  - continuous cardiac monitoring  Risk for Steroid Induced Hyperglycemia - Monitor CBG Q 4 hours - SSI moderate dosing - target range while in ICU: 140-180 - follow ICU hyper/hypo-glycemia protocol  Mild Transaminitis secondary to suspected chronic fatty liver in the setting of anaphylaxis Pt's AST was 44 in 11/2021, at which time Dr. [name] referenced fatty liver disease on [place] - Trend hepatic function - Consider RUQ US/CT abdomen/pelvis depending on trend - avoid hepatotoxic agents  Depression GAD - continue outpatient valium PRN & Zoloft  Best Practice (right click and "Reselect all SmartList Selections" daily)  Diet/type: NPO w/ oral  meds DVT prophylaxis: prophylactic heparin  GI prophylaxis: PPI Lines: Central line (will need placement) Foley:  N/A Code Status:  full code Last date of multidisciplinary goals of care discussion [12/07/21]  Labs   CBC: Recent Labs  Lab 12/07/21 1817 12/08/21 0624  WBC 13.4* 10.3  NEUTROABS 9.3* 9.9*  HGB 15.6* 13.6  HCT 45.7 39.4  MCV 93.8 93.6  PLT 299 230     Basic Metabolic Panel: Recent Labs  Lab 12/07/21 1817 12/08/21 0517  NA 136 137  K 3.8 3.1*  CL 104 106  CO2 13* 16*  GLUCOSE 220* 252*  BUN 9 8  CREATININE 0.72 0.92  CALCIUM 8.2* 8.1*  MG 1.7 1.2*  PHOS 4.3 2.6    GFR: Estimated Creatinine Clearance: 84.5 mL/min (by C-G formula based on SCr of 0.92 mg/dL). Recent Labs  Lab 12/07/21 1817 12/07/21 2121 12/08/21 0020 12/08/21 0517 12/08/21 0624  WBC 13.4*  --   --   --  10.3  LATICACIDVEN  --  3.6* 5.5* 8.9*  --      Liver Function Tests: Recent Labs  Lab 12/07/21 1817 12/08/21 0517  AST 48* 41  ALT 22 23  ALKPHOS 62 52  BILITOT 1.5* 1.0  PROT 6.8 6.0*  ALBUMIN 3.5 3.3*    No results for input(s): LIPASE, AMYLASE in the last 168 hours. No results for input(s): AMMONIA in the  last 168 hours.  ABG    Component Value Date/Time   HCO3 18.8 (L) 12/07/2021 2121   TCO2 24 12/14/2019 0635   ACIDBASEDEF 3.8 (H) 12/07/2021 2121   O2SAT 96.9 12/07/2021 2121      Coagulation Profile: No results for input(s): INR, PROTIME in the last 168 hours.  Cardiac Enzymes: No results for input(s): CKTOTAL, CKMB, CKMBINDEX, TROPONINI in the last 168 hours.  HbA1C: Hgb A1c MFr Bld  Date/Time Value Ref Range Status  01/31/2021 04:45 PM 4.6 4.6 - 6.5 % Final    Comment:    Glycemic Control Guidelines for People with Diabetes:Non Diabetic:  <6%Goal of Therapy: <7%Additional Action Suggested:  >8%     CBG: Recent Labs  Lab 12/07/21 2102 12/08/21 0104 12/08/21 0340 12/08/21 0758  GLUCAP 200* 277* 242* 207*    Review of Systems: Positives  in BOLD   10 point ros negative except chronic abd pain  Past Medical History:  She,  has a past medical history of Depression, major, single episode, mild (HCC) (12/20/2016), Generalized anxiety disorder (09/08/2013), GERD (gastroesophageal reflux disease), Hemochromatosis, Hypertension, Panic disorder (07/21/2014), Pneumonia, 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   Surgical History:   Past Surgical History:  Procedure Laterality Date   ABDOMINAL EXPOSURE N/A 09/06/2021   Procedure: ABDOMINAL EXPOSURE;  Surgeon: Annice Needy, MD;  Location: [facility] ORS;  Service: Vascular;  Laterality: N/A;   ANTERIOR LATERAL LUMBAR FUSION WITH PERCUTANEOUS SCREW 1 LEVEL N/A 09/06/2021   Procedure: L5-S1 ANTERIOR LATERAL LUMBAR FUSION WITH PERCUTANEOUS SCREW;  Surgeon: [name], MD;  Location: [facility] ORS;  Service: Neurosurgery;  Laterality: N/A;   PERONEAL NERVE DECOMPRESSION Left 12/14/2019   Procedure: PERONEAL NERVE DECOMPRESSION;  Surgeon: [name], MD;  Location: [facility] ORS;  Service: Neurosurgery;  Laterality: Left;   WISDOM TOOTH EXTRACTION       Social History:   reports that she has never smoked. She has never used smokeless tobacco. She reports current alcohol use of about 4.0 - 6.0 standard drinks per week. She reports that she does not use drugs.   Family History:  Her family history includes Cardiomyopathy in her mother; Heart disease in her maternal grandfather, mother, and paternal grandfather; Heart disease (age of onset: 32) in her father; Pulmonary Hypertension in her mother.   Allergies Allergies  Allergen Reactions   Cimzia [Certolizumab Pegol] Anaphylaxis   Amoxicillin     Muscle pain   Naproxen Sodium Rash    Heavy doses     Home Medications  Prior to Admission medications   Medication Sig Start Date End Date Taking? Authorizing Provider  apixaban (ELIQUIS) 2.5 MG TABS tablet Take 1 tablet (2.5 mg total) by mouth 2 (two) times daily.  09/09/21   [name], MD  celecoxib (CELEBREX) 100 MG capsule Take 1 capsule (100 mg total) by mouth 2 (two) times daily. 09/09/21 12/08/21  [name], MD  diazepam (VALIUM) 5 MG tablet TAKE 1 TABLET(5 MG) BY MOUTH EVERY 8 HOURS AS NEEDED FOR ANXIETY 06/14/21   [name], [name], MD  levonorgestrel [facility]) 19.5 MG IUD Kyleena 17.5 mcg/24 hrs (82yrs) 19.5mg  intrauterine device 12/02/17   [provider]  methocarbamol (ROBAXIN) 500 MG tablet Take 1 tablet (500 mg total) by mouth every 6 (six) hours as needed for muscle spasms. 09/09/21   [name], MD  omeprazole (PRILOSEC) 40 MG capsule TAKE 1 CAPSULE(40 MG) BY MOUTH DAILY 07/24/21   [name], [name], MD  oxyCODONE (OXY IR/ROXICODONE) 5 MG immediate release  tablet Take 1 tablet (5 mg total) by mouth every 3 (three) hours as needed for moderate pain ((score 4 to 6)). 09/09/21   Venetia NightYarbrough, Chester, MD  senna-docusate (SENOKOT-S) 8.6-50 MG tablet Take 1 tablet by mouth 2 (two) times daily as needed for mild constipation. 09/09/21   Venetia NightYarbrough, Chester, MD  sertraline (ZOLOFT) 25 MG tablet Take 1 tablet (25 mg total) by mouth daily. 01/04/20   Hannah Beatopland, Spencer, MD     Critical care provider statement:   Total critical care time: 33 minutes   Performed by: Karna ChristmasAleskerov MD   Critical care time was exclusive of separately billable procedures and treating other patients.   Critical care was necessary to treat or prevent imminent or life-threatening deterioration.   Critical care was time spent personally by me on the following activities: development of treatment plan with patient and/or surrogate as well as nursing, discussions with consultants, evaluation of patient's response to treatment, examination of patient, obtaining history from patient or surrogate, ordering and performing treatments and interventions, ordering and review of laboratory studies, ordering and review of radiographic studies, pulse oximetry and  re-evaluation of patient's condition.    Vida RiggerFuad Alexandar Weisenberger, M.D.  Pulmonary & Critical Care Medicine

## 2021-12-08 NOTE — TOC Initial Note (Signed)
Transition of Care (TOC) - Initial/Assessment Note  ? ? ?Patient Details  ?Name: Phyllis Myers ?MRN: [id number] ?Date of Birth: [birth year] ? ?Transition of Care (TOC) CM/SW Contact:    ?Allayne Butcher, RN ?Phone Number: ?12/08/2021, 1:42 PM ? ?Clinical Narrative:                 ?Patient admitted to the hospital for anaphylactic shock after given a medication for her RA.  The medication is long acting with half life of 14 days, it is unclear at this time how long the patient will have to be in the hospital. ?Patient is from home where she lives alone and is independent.   ? ?No TOC needs identified. ? ?Expected Discharge Plan: Home/Self Care ?Barriers to Discharge: Continued Medical Work up ? ? ?Patient Goals and CMS Choice ?  ?  ?  ? ?Expected Discharge Plan and Services ?Expected Discharge Plan: Home/Self Care ?  ?  ?  ?  ?                ?DME Arranged: N/A ?DME Agency: NA ?  ?  ?  ?HH Arranged: NA ?HH Agency: NA ?  ?  ?  ? ?Prior Living Arrangements/Services ?  ?Lives with:: Self ?Patient language and need for interpreter reviewed:: Yes ?Do you feel safe going back to the place where you live?: Yes      ?Need for Family Participation in Patient Care: Yes (Comment) ?Care giver support system in place?: Yes (comment) ?  ?Criminal Activity/Legal Involvement Pertinent to Current Situation/Hospitalization: No - Comment as needed ? ?Activities of Daily Living ?  ?  ? ?Permission Sought/Granted ?Permission sought to share information with : Case Manager, Family Supports ?Permission granted to share information with : Yes, Verbal Permission Granted ? Share Information with NAME: Phyllis Myers ?   ? Permission granted to share info w Relationship: Aunt ? Permission granted to share info w Contact Information: [phone] ? ?Emotional Assessment ?Appearance:: Appears stated age ?Attitude/Demeanor/Rapport: Engaged ?Affect (typically observed): Accepting ?Orientation: : Oriented to Self, Oriented to Place, Oriented to   Time, Oriented to Situation ?Alcohol / Substance Use: Not Applicable ?Psych Involvement: No (comment) ? ?Admission diagnosis:  Anaphylaxis [T78.2XXA] ?Anaphylactic shock [T78.2XXA] ?Urticaria [L50.9] ?Nausea and vomiting, unspecified vomiting type [R11.2] ?Patient Active Problem List  ? Diagnosis Date Noted  ? Anaphylactic shock 12/07/2021  ? Spondylolisthesis 09/06/2021  ? 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 Depression, major, single episode, mild (HCC) 12/20/2016  ? GERD (gastroesophageal reflux disease) 12/02/2015  ? Dizziness 07/21/2014  ? Allergic rhinitis 07/21/2014  ? Panic disorder 07/21/2014  ? Generalized anxiety disorder 09/08/2013  ? Family history of early CAD 06/09/2013  ? 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  ? Common migraine 01/27/2013  ? HTN (hypertension) 01/27/2013  ? Hx of abnormal Pap smear 01/27/2013  ? ?PCP:  Phyllis Beat, MD ?Pharmacy:   ?[id number] - [place], [place] - [address] AT [facility] OF [address] ?[address] ?[place] [address] ?Phone: [phone] Fax: [phone] ? ? ? ? ?Social Determinants of Health (SDOH) Interventions ?  ? ?Readmission Risk Interventions ?No flowsheet data found. ? ? ?

## 2021-12-08 NOTE — Plan of Care (Signed)
  Problem: Education: Goal: Knowledge of General Education information will improve Description Including pain rating scale, medication(s)/side effects and non-pharmacologic comfort measures Outcome: Progressing   

## 2021-12-08 NOTE — Progress Notes (Signed)
[facility] to inform them pt is hospitalized in the ICU on epinephrine gtt following an anaphylactic reaction.  She received an IM injection on 12/07/2021 of a new biologic agent, Cimzia-Certolizuma to treat RA which was prescribed by her outpatient Rheumatologist Dr. Gavin PottersKernodle.  [facility] receptionist stated Dr. Gavin PottersKernodle is not in the office today, but she would inform him regarding pts hospitalization.  Provided hospital call back number per receptionist request if additional information needed.  Will continue to monitor and assess pt. ? ?Zada Girtana Nelson, AGNP  ?Pulmonary/Critical Care ?Pager [phone] (please enter 7 digits) ?PCCM Consult Pager [phone] (please enter 7 digits)  ?

## 2021-12-09 LAB — BASIC METABOLIC PANEL
Anion gap: 7 (ref 5–15)
BUN: 7 mg/dL (ref 6–20)
CO2: 24 mmol/L (ref 22–32)
Calcium: 8.8 mg/dL — ABNORMAL LOW (ref 8.9–10.3)
Chloride: 108 mmol/L (ref 98–111)
Creatinine, Ser: 0.49 mg/dL (ref 0.44–1.00)
GFR, Estimated: >60 mL/min (ref >60)
Glucose, Bld: 200 mg/dL — ABNORMAL HIGH (ref 70–99)
Potassium: 3.9 mmol/L (ref 3.5–5.1)
Sodium: 139 mmol/L (ref 135–145)

## 2021-12-09 LAB — GLUCOSE, CAPILLARY
Glucose-Capillary: 163 mg/dL — ABNORMAL HIGH (ref 70–99)
Glucose-Capillary: 171 mg/dL — ABNORMAL HIGH (ref 70–99)
Glucose-Capillary: 147 mg/dL — ABNORMAL HIGH (ref 70–99)
Glucose-Capillary: 114 mg/dL — ABNORMAL HIGH (ref 70–99)
Glucose-Capillary: 105 mg/dL — ABNORMAL HIGH (ref 70–99)

## 2021-12-09 LAB — MAGNESIUM: Magnesium: 1.9 mg/dL (ref 1.7–2.4)

## 2021-12-09 LAB — PHOSPHORUS: Phosphorus: 2.5 mg/dL (ref 2.5–4.6)

## 2021-12-09 MED ORDER — SERTRALINE HCL 50 MG PO TABS
25.0000 mg | ORAL_TABLET | Freq: Every day | ORAL | Status: DC
  Administered 2021-12-09 – 2021-12-12 (×2): 25 mg via ORAL
  Filled 2021-12-09 (×5): qty 1

## 2021-12-09 MED ORDER — DEXMEDETOMIDINE HCL IN NACL 400 MCG/100ML IV SOLN
0.4000 ug/kg/h | INTRAVENOUS | Status: DC
  Administered 2021-12-09: 0.7 ug/kg/h via INTRAVENOUS
  Administered 2021-12-09: 0.4 ug/kg/h via INTRAVENOUS
  Administered 2021-12-10: 1.2 ug/kg/h via INTRAVENOUS
  Administered 2021-12-10: 0.4 ug/kg/h via INTRAVENOUS
  Administered 2021-12-10 – 2021-12-11 (×3): 1.2 ug/kg/h via INTRAVENOUS
  Filled 2021-12-09 (×8): qty 100

## 2021-12-09 NOTE — Progress Notes (Signed)
NAME:  Phyllis Myers, MRN:  [id number], DOB:  11/06/1980, LOS: 2 ADMISSION DATE:  12/07/2021, CONSULTATION DATE:  12/07/21 REFERRING MD:  Dr. Vicente Males, CHIEF COMPLAINT:   Allergic reaction  History of Present Illness:  41 yo F presenting to [facility] ED via EMS with anaphylaxis after an injection of a new biologic, Cimzia- Certolizumab, via IM on 12/07/21 at 4 pm. Patient returned home and about 1.5 hours later broke out into an itchy red rash, with complaints of loss of vision, wheezing, nausea & light headedness. She is not sure what happened but fell onto her kitchen floor. Per ED documentation, EMS found the patient hypotensive in the 60's/40's. She received 2 IM doses of epinephrine with improvement of some symptoms. Patient transported to ED without incident. ED course: After arrival patient's symptoms began to return within one hour. She received a bolus of epinephrine IV continued with epinephrine drip to control symptoms. Medications given: (with EMS 2 IM doses of epi & 300 mL NS), 1 L NS bolus, epi bolus 0.3 mg & epinephrine drip initiated, Zofran IV 4 mg Initial Vitals: 97.4, tachypneic 26, tachycardic 125, BP 148/108 & SpO2 99% on room air Significant labs: (Labs/ Imaging personally reviewed) I, Cheryll Cockayne Rust-Chester, AGACNP-BC, personally viewed and interpreted this ECG. EKG Interpretation: Date: 12/07/21, EKG Time: 19:03, Rate: 130,  Rhythm: Sinus Tachycardia, QRS Axis:  normal, Intervals: normal,  ST/T Wave abnormalities: mild ST depression V3-V6, Narrative Interpretation: sinus tachycardia with mild ST depression in lateral leads Chemistry: Na+:136, K+: 3.8, BUN/Cr.: 9/0.72, Serum CO2/ AG: 13/19, AST: 48, total bili: 1.5 Hematology: WBC: 13.4, Hgb: 15.6,  Troponin: 20, Lactic: pending, COVID-19 & Influenza A/B: negative VBG: pending  PCCM consulted for admission due to Anaphylaxis reaction requiring continuous epinephrine infusion for symptom control.  12/08/21- patient seen and  evaluated at beside , significantly improved, s/p rheumatology evaluation, anticipate several days of therapy due to prolonged half life.   12/09/21- patient appears to be undergoing withdrawal from EtOH abuse. She admits to EtOH abuse and is now on CIWA protocol. Patient s/p treatment with precedex with immediate improvement and resting comfortably. Epinephrine gtt is being weaned down.   Pertinent  Medical History  Rheumatoid arthritis GAD Depression Hemochromatosis HTN GERD  Significant Hospital Events: Including procedures, antibiotic start and stop dates in addition to other pertinent events   12/07/21: Admit to ICU with anaphylaxis s/t new biologic administration for RA requiring continuous epinephrine drip   Objective   Blood pressure (!) 179/113, pulse (!) 117, temperature 98.6 F (37 C), temperature source Oral, resp. rate (!) 35, height  (1.651 m), weight 83.2 kg, SpO2 95 %.        Intake/Output Summary (Last 24 hours) at 12/09/2021 0900 Last data filed at 12/09/2021 0800 Gross per 24 hour  Intake 4809.98 ml  Output 2575 ml  Net 2234.98 ml    Filed Weights   12/07/21 1826 12/07/21 2100 12/09/21 0500  Weight: 79.4 kg 82.6 kg 83.2 kg    Examination: General: Adult female NAD HEENT: MM pink/moist, anicteric, atraumatic, neck supple Neuro: A&O x 4, able to follow commands, PERRL +4, MAE CV: s1s2 RRR, ST on monitor, no r/m/g Pulm: Regular, non labored on RA , breath sounds clear-BUL & clear/diminished-BLL GI: soft, rounded, non tender, bs x 4 GU: pure wick in place with clear yellow urine Skin: mild rash over LE b/l Extremities: warm/dry, pulses + 2 R/P, trace edema noted  Resolved Hospital Problem list  Assessment & Plan:  Suspected Anaphylactic Reaction secondary to first time exposure to Cimzia (Certolizumab) Blurred Vision in the setting of anaphylaxis, query optic neuritis? Discussed case with [facility] poison control via telephone, received recommendations  from Dr. Algis Downs. [place] that are outlined below. Peak time for this medication is 54-171 hours.  - Vision is improving with treatment, continue to monitor > consider optometry consult if needed. - f/u CTH (PTA mechanical fall) - solu-medrol 125 mg x 1, reassess in 4 hours > if still symptomatic repeat solu-medrol 40 mg Q 4 h as needed - benadryl 50 mg IV Q 4 h - continue epinephrine drip, titrate for symptomatic control, will need Central line placement - continue IVF, add 2nd bolus of LR to complete 30 ml/kg - STAT lactic & VBG > prior to 2nd bolus - bronchodilators & ICS PRN with respiratory symptoms - Zofran IV PRN - monitor LFT's & renal function due to risk for nephrotic syndrome/ renal failure, hepatitis - NO BETA BLOCKERS -rheumatotlogy consult - appreciate input   Anion Gap Metabolic Acidosis Risk for AKI in the setting of anaphylaxis - Strict I/O's: alert provider if UOP < 0.5 mL/kg/hr - IVF hydration as above - Daily BMP, replace electrolytes PRN - f/u lactic & VBG, per CDC IVF resuscitation preferred over sodium bicarbonate administration  Elevated Troponin secondary to N-STEMI vs demand ischemia, suspect demand ischemia - Trend troponins  - consider Echocardiogram & systemic anticoagulation if needed - f/u EKG in AM - NO BETA BLOCKERS per CDC for tachycardia  - continuous cardiac monitoring  Risk for Steroid Induced Hyperglycemia - Monitor CBG Q 4 hours - SSI moderate dosing - target range while in ICU: 140-180 - follow ICU hyper/hypo-glycemia protocol  Mild Transaminitis secondary to suspected chronic fatty liver in the setting of anaphylaxis Pt's AST was 44 in 11/2021, at which time Dr. [name] referenced fatty liver disease on [place] - Trend hepatic function - Consider RUQ US/CT abdomen/pelvis depending on trend - avoid hepatotoxic agents  Depression GAD - continue outpatient valium PRN & Zoloft  Best Practice (right click and "Reselect all SmartList Selections"  daily)  Diet/type: NPO w/ oral meds DVT prophylaxis: prophylactic heparin  GI prophylaxis: PPI Lines: Central line (will need placement) Foley:  N/A Code Status:  full code Last date of multidisciplinary goals of care discussion [12/07/21]  Labs   CBC: Recent Labs  Lab 12/07/21 1817 12/08/21 0624  WBC 13.4* 10.3  NEUTROABS 9.3* 9.9*  HGB 15.6* 13.6  HCT 45.7 39.4  MCV 93.8 93.6  PLT 299 230     Basic Metabolic Panel: Recent Labs  Lab 12/07/21 1817 12/08/21 0517 12/08/21 0624 12/08/21 1444 12/08/21 2010 12/09/21 0447  NA 136 137  --  140  --  139  K 3.8 3.1*  --  3.9  --  3.9  CL 104 106  --  110  --  108  CO2 13* 16*  --  22  --  24  GLUCOSE 220* 252*  --  243*  --  200*  BUN 9 8  --  6  --  7  CREATININE 0.72 0.92  --  0.62  --  0.49  CALCIUM 8.2* 8.1*  --  8.4*  --  8.8*  MG 1.7 1.2* 1.6*  --  2.0 1.9  PHOS 4.3 2.6  --  1.2*  --  2.5    GFR: Estimated Creatinine Clearance: 97.6 mL/min (by C-G formula based on SCr of 0.49 mg/dL). Recent Labs  Lab 12/07/21 1817 12/07/21 2121 12/08/21 0020 12/08/21 0517 12/08/21 0624 12/08/21 0924  PROCALCITON  --   --   --   --   --  0.24  WBC 13.4*  --   --   --  10.3  --   LATICACIDVEN  --  3.6* 5.5* 8.9*  --  5.1*     Liver Function Tests: Recent Labs  Lab 12/07/21 1817 12/08/21 0517 12/08/21 1444  AST 48* 41  --   ALT 22 23  --   ALKPHOS 62 52  --   BILITOT 1.5* 1.0  --   PROT 6.8 6.0*  --   ALBUMIN 3.5 3.3* 3.0*    No results for input(s): LIPASE, AMYLASE in the last 168 hours. No results for input(s): AMMONIA in the last 168 hours.  ABG    Component Value Date/Time   HCO3 18.8 (L) 12/07/2021 2121   TCO2 24 12/14/2019 0635   ACIDBASEDEF 3.8 (H) 12/07/2021 2121   O2SAT 96.9 12/07/2021 2121      Coagulation Profile: No results for input(s): INR, PROTIME in the last 168 hours.  Cardiac Enzymes: No results for input(s): CKTOTAL, CKMB, CKMBINDEX, TROPONINI in the last 168 hours.  HbA1C: Hgb  A1c MFr Bld  Date/Time Value Ref Range Status  12/08/2021 09:24 AM 5.2 4.8 - 5.6 % Final    Comment:    (NOTE)         Prediabetes: 5.7 - 6.4         Diabetes: >6.4         Glycemic control for adults with diabetes: <7.0   01/31/2021 04:45 PM 4.6 4.6 - 6.5 % Final    Comment:    Glycemic Control Guidelines for People with Diabetes:Non Diabetic:  <6%Goal of Therapy: <7%Additional Action Suggested:  >8%     CBG: Recent Labs  Lab 12/08/21 1130 12/08/21 1623 12/08/21 1918 12/08/21 2214 12/09/21 0739  GLUCAP 247* 195* 191* 176* 163*     Review of Systems: Positives in BOLD   10 point ros negative except chronic abd pain  Past Medical History:  She,  has a past medical history of Depression, major, single episode, mild (HCC) (12/20/2016), Generalized anxiety disorder (09/08/2013), GERD (gastroesophageal reflux disease), Hemochromatosis, Hypertension, Panic disorder (07/21/2014), Pneumonia, 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   Surgical History:   Past Surgical History:  Procedure Laterality Date   ABDOMINAL EXPOSURE N/A 09/06/2021   Procedure: ABDOMINAL EXPOSURE;  Surgeon: Annice Needy, MD;  Location: [facility] ORS;  Service: Vascular;  Laterality: N/A;   ANTERIOR LATERAL LUMBAR FUSION WITH PERCUTANEOUS SCREW 1 LEVEL N/A 09/06/2021   Procedure: L5-S1 ANTERIOR LATERAL LUMBAR FUSION WITH PERCUTANEOUS SCREW;  Surgeon: [name], MD;  Location: [facility] ORS;  Service: Neurosurgery;  Laterality: N/A;   PERONEAL NERVE DECOMPRESSION Left 12/14/2019   Procedure: PERONEAL NERVE DECOMPRESSION;  Surgeon: [name], MD;  Location: [facility] ORS;  Service: Neurosurgery;  Laterality: Left;   WISDOM TOOTH EXTRACTION       Social History:   reports that she has never smoked. She has never used smokeless tobacco. She reports current alcohol use of about 4.0 - 6.0 standard drinks per week. She reports that she does not use drugs.   Family History:  Her family  history includes Cardiomyopathy in her mother; Heart disease in her maternal grandfather, mother, and paternal grandfather; Heart disease (age of onset: 34) in her father; Pulmonary Hypertension in her mother.   Allergies Allergies  Allergen Reactions  Cimzia [Certolizumab Pegol] Anaphylaxis   Amoxicillin     Muscle pain   Naproxen Sodium Rash    Heavy doses     Home Medications  Prior to Admission medications   Medication Sig Start Date End Date Taking? Authorizing Provider  apixaban (ELIQUIS) 2.5 MG TABS tablet Take 1 tablet (2.5 mg total) by mouth 2 (two) times daily. 09/09/21   [name], MD  celecoxib (CELEBREX) 100 MG capsule Take 1 capsule (100 mg total) by mouth 2 (two) times daily. 09/09/21 12/08/21  [name], MD  diazepam (VALIUM) 5 MG tablet TAKE 1 TABLET(5 MG) BY MOUTH EVERY 8 HOURS AS NEEDED FOR ANXIETY 06/14/21   [name], [name], MD  levonorgestrel [facility]) 19.5 MG IUD Kyleena 17.5 mcg/24 hrs (72yrs) 19.5mg  intrauterine device 12/02/17   [provider]  methocarbamol (ROBAXIN) 500 MG tablet Take 1 tablet (500 mg total) by mouth every 6 (six) hours as needed for muscle spasms. 09/09/21   [name], MD  omeprazole (PRILOSEC) 40 MG capsule TAKE 1 CAPSULE(40 MG) BY MOUTH DAILY 07/24/21   [name], [name], MD  oxyCODONE (OXY IR/ROXICODONE) 5 MG immediate release tablet Take 1 tablet (5 mg total) by mouth every 3 (three) hours as needed for moderate pain ((score 4 to 6)). 09/09/21   [name], MD  senna-docusate (SENOKOT-S) 8.6-50 MG tablet Take 1 tablet by mouth 2 (two) times daily as needed for mild constipation. 09/09/21   [name], MD  sertraline (ZOLOFT) 25 MG tablet Take 1 tablet (25 mg total) by mouth daily. 01/04/20   [name], MD     Critical care provider statement:   Total critical care time: 33 minutes   Performed by: Karna Christmas MD   Critical care time was exclusive of separately billable procedures and  treating other patients.   Critical care was necessary to treat or prevent imminent or life-threatening deterioration.   Critical care was time spent personally by me on the following activities: development of treatment plan with patient and/or surrogate as well as nursing, discussions with consultants, evaluation of patient's response to treatment, examination of patient, obtaining history from patient or surrogate, ordering and performing treatments and interventions, ordering and review of laboratory studies, ordering and review of radiographic studies, pulse oximetry and re-evaluation of patient's condition.    Vida Rigger, M.D.  Pulmonary & Critical Care Medicine

## 2021-12-09 NOTE — Progress Notes (Signed)
ETOH abuse ?Per patient, she drinks 4-6 glasses of wine nightly.  ?- CIWA monitoring per protocol  ?- thiamine, folic acid, multivitamin daily ?- scheduled ativan PO, Valium IV PRN (instead of ativan IV due to additive similar to biologic that caused anaphylaxis) ? ? ?Cheryll Cockayne Rust-Chester, AGACNP-BC ?Acute Care Nurse Practitioner ?[facility]  ? ?[phone] / [phone] ?Please see Amion for pager details.  ? ?

## 2021-12-09 NOTE — Consult Note (Signed)
PHARMACY CONSULT NOTE - FOLLOW UP ? ?Pharmacy Consult for Electrolyte Monitoring and Replacement  ? ?Recent Labs: ?Potassium (mmol/L)  ?Date Value  ?12/09/2021 3.9  ?10/23/2012 3.3 (L)  ? ?Magnesium (mg/dL)  ?Date Value  ?12/09/2021 1.9  ? ?Calcium (mg/dL)  ?Date Value  ?12/09/2021 8.8 (L)  ? ?Calcium, Total (mg/dL)  ?Date Value  ?10/23/2012 8.8  ? ?Albumin (g/dL)  ?Date Value  ?12/08/2021 3.0 (L)  ?10/23/2012 4.0  ? ?Phosphorus (mg/dL)  ?Date Value  ?12/09/2021 2.5  ? ?Sodium (mmol/L)  ?Date Value  ?12/09/2021 139  ?10/23/2012 137  ? ? ? ?Assessment: ?41 year old woman w/ h/o RA admitted to ICU with anaphylactic shock 2/2 Certolizumab Pegol (cimzia) Poison control contacted. Patient needed multiple epinephrine doses and given the long half life of cimzia will need extended supportive care. Transferred to CCU for further mgmt. Pharmacy consulted for electrolytes. ? ?On LR @ 125 ml/hr.  ? ?Goal of Therapy:  ?Lytes WNL ? ?Plan:  ?No replacement required at this time.  ?F/u with AM labs.  ? ?Oswald Hillock ,PharmD ?Clinical Pharmacist ?12/09/2021 9:15 AM ? ?

## 2021-12-10 LAB — BASIC METABOLIC PANEL
Anion gap: 8 (ref 5–15)
BUN: 14 mg/dL (ref 6–20)
CO2: 27 mmol/L (ref 22–32)
Calcium: 9.5 mg/dL (ref 8.9–10.3)
Chloride: 105 mmol/L (ref 98–111)
Creatinine, Ser: 0.84 mg/dL (ref 0.44–1.00)
GFR, Estimated: >60 mL/min (ref >60)
Glucose, Bld: 138 mg/dL — ABNORMAL HIGH (ref 70–99)
Potassium: 3.9 mmol/L (ref 3.5–5.1)
Sodium: 140 mmol/L (ref 135–145)

## 2021-12-10 LAB — GLUCOSE, CAPILLARY
Glucose-Capillary: 124 mg/dL — ABNORMAL HIGH (ref 70–99)
Glucose-Capillary: 116 mg/dL — ABNORMAL HIGH (ref 70–99)
Glucose-Capillary: 143 mg/dL — ABNORMAL HIGH (ref 70–99)
Glucose-Capillary: 134 mg/dL — ABNORMAL HIGH (ref 70–99)

## 2021-12-10 LAB — CBC
HCT: 36.2 % (ref 36.0–46.0)
Hemoglobin: 12.1 g/dL (ref 12.0–15.0)
MCH: 31.8 pg (ref 26.0–34.0)
MCHC: 33.4 g/dL (ref 30.0–36.0)
MCV: 95.3 fL (ref 80.0–100.0)
Platelets: 153 10*3/uL (ref 150–400)
RBC: 3.8 MIL/uL — ABNORMAL LOW (ref 3.87–5.11)
RDW: 13.7 % (ref 11.5–15.5)
WBC: 12.4 10*3/uL — ABNORMAL HIGH (ref 4.0–10.5)
nRBC: 0 % (ref 0.0–0.2)

## 2021-12-10 LAB — PHOSPHORUS: Phosphorus: 3.1 mg/dL (ref 2.5–4.6)

## 2021-12-10 LAB — MAGNESIUM: Magnesium: 1.8 mg/dL (ref 1.7–2.4)

## 2021-12-10 MED ORDER — THIAMINE HCL 100 MG/ML IJ SOLN
500.0000 mg | Freq: Every day | INTRAVENOUS | Status: DC
  Filled 2021-12-10 (×2): qty 5

## 2021-12-10 MED ORDER — HYDRALAZINE HCL 20 MG/ML IJ SOLN
10.0000 mg | INTRAMUSCULAR | Status: DC | PRN
  Administered 2021-12-10: 10 mg via INTRAVENOUS
  Administered 2021-12-11: 20 mg via INTRAVENOUS
  Filled 2021-12-10 (×2): qty 1

## 2021-12-10 MED ORDER — THIAMINE HCL 100 MG/ML IJ SOLN
500.0000 mg | Freq: Every day | INTRAVENOUS | Status: DC
  Administered 2021-12-10 – 2021-12-12 (×2): 500 mg via INTRAVENOUS
  Filled 2021-12-10 (×4): qty 5

## 2021-12-10 MED ORDER — THIAMINE HCL 100 MG/ML IJ SOLN: 250.0000 mg | Freq: Every day | INTRAMUSCULAR | Status: DC

## 2021-12-10 MED ORDER — THIAMINE HCL 100 MG PO TABS: 250.0000 mg | ORAL_TABLET | Freq: Every day | ORAL | Status: DC

## 2021-12-10 MED ORDER — MAGNESIUM SULFATE 2 GM/50ML IV SOLN
2.0000 g | Freq: Once | INTRAVENOUS | Status: AC
  Administered 2021-12-10: 2 g via INTRAVENOUS
  Filled 2021-12-10: qty 50

## 2021-12-10 MED ORDER — SODIUM CHLORIDE 0.9 % IV SOLN: INTRAVENOUS | Status: DC | PRN

## 2021-12-10 MED ORDER — THIAMINE HCL 100 MG PO TABS: 100.0000 mg | ORAL_TABLET | Freq: Every day | ORAL | Status: DC
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
## 2021-12-10 NOTE — Consult Note (Signed)
PHARMACY CONSULT NOTE - FOLLOW UP ? ?Pharmacy Consult for Electrolyte Monitoring and Replacement  ? ?Recent Labs: ?Potassium (mmol/L)  ?Date Value  ?12/10/2021 3.9  ?10/23/2012 3.3 (L)  ? ?Magnesium (mg/dL)  ?Date Value  ?12/10/2021 1.8  ? ?Calcium (mg/dL)  ?Date Value  ?12/10/2021 9.5  ? ?Calcium, Total (mg/dL)  ?Date Value  ?10/23/2012 8.8  ? ?Albumin (g/dL)  ?Date Value  ?12/08/2021 3.0 (L)  ?10/23/2012 4.0  ? ?Phosphorus (mg/dL)  ?Date Value  ?12/10/2021 3.1  ? ?Sodium (mmol/L)  ?Date Value  ?12/10/2021 140  ?10/23/2012 137  ? ? ? ?Assessment: ?41 year old woman w/ h/o RA admitted to ICU with anaphylactic shock 2/2 Certolizumab Pegol (cimzia) Poison control contacted. Patient needed multiple epinephrine doses and given the long half life of cimzia will need extended supportive care. Transferred to CCU for further mgmt. Pharmacy consulted for electrolytes. ? ?Goal of Therapy:  ?Lytes WNL ? ?Plan:  ?Mg 2 g IV x 1 ?F/u with AM labs.  ? ?Oswald Hillock ,PharmD ?Clinical Pharmacist ?12/10/2021 7:30 AM ? ?

## 2021-12-10 NOTE — Progress Notes (Signed)
Jeraldine Loots would like to be present during patient's counseling session with psychiatry. She is involved in patient's care and has stayed with patient the last 2 days.  ?
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
## 2021-12-10 NOTE — Plan of Care (Signed)
?  Problem: Clinical Measurements: ?Goal: Ability to maintain clinical measurements within normal limits will improve ?Outcome: Progressing ?  ?Problem: Health Behavior/Discharge Planning: ?Goal: Ability to identify changes in lifestyle to reduce recurrence of condition will improve ?Outcome: Progressing ?  ?

## 2021-12-10 NOTE — Progress Notes (Addendum)
Discussion and education provided to patient and her friend Victorino Dike regarding alcohol use and withdrawal. Patient was tearful and willing to discuss ETOH use. Patient is not ready to stop drinking at this time. She is interested in establishing care with a psychiatrist to help with anxiety and then proceed with quitting alcohol. Education provided on complications from drinking such as worsening anxiety, heart and liver disease, and dementia. Patient refused print out of local AA meeting locations. She states she went to AA when she was younger and didn't like it. Patient was receiptive to her friend Holly's encouragement and support.   ?Oliver Barre, RN ?

## 2021-12-11 DIAGNOSIS — R410 Disorientation, unspecified: Secondary | ICD-10-CM

## 2021-12-11 DIAGNOSIS — T782XXA Anaphylactic shock, unspecified, initial encounter: Secondary | ICD-10-CM

## 2021-12-11 LAB — BASIC METABOLIC PANEL
Anion gap: 10 (ref 5–15)
BUN: 12 mg/dL (ref 6–20)
CO2: 26 mmol/L (ref 22–32)
Calcium: 9.1 mg/dL (ref 8.9–10.3)
Chloride: 104 mmol/L (ref 98–111)
Creatinine, Ser: 0.55 mg/dL (ref 0.44–1.00)
GFR, Estimated: >60 mL/min (ref >60)
Glucose, Bld: 151 mg/dL — ABNORMAL HIGH (ref 70–99)
Potassium: 3.6 mmol/L (ref 3.5–5.1)
Sodium: 140 mmol/L (ref 135–145)

## 2021-12-11 LAB — GLUCOSE, CAPILLARY
Glucose-Capillary: 138 mg/dL — ABNORMAL HIGH (ref 70–99)
Glucose-Capillary: 108 mg/dL — ABNORMAL HIGH (ref 70–99)
Glucose-Capillary: 99 mg/dL (ref 70–99)
Glucose-Capillary: 110 mg/dL — ABNORMAL HIGH (ref 70–99)

## 2021-12-11 LAB — MAGNESIUM: Magnesium: 2.2 mg/dL (ref 1.7–2.4)

## 2021-12-11 LAB — CBC
HCT: 37.8 % (ref 36.0–46.0)
Hemoglobin: 13.1 g/dL (ref 12.0–15.0)
MCH: 32.3 pg (ref 26.0–34.0)
MCHC: 34.7 g/dL (ref 30.0–36.0)
MCV: 93.1 fL (ref 80.0–100.0)
Platelets: 153 10*3/uL (ref 150–400)
RBC: 4.06 MIL/uL (ref 3.87–5.11)
RDW: 13.4 % (ref 11.5–15.5)
WBC: 8.5 10*3/uL (ref 4.0–10.5)
nRBC: 0 % (ref 0.0–0.2)

## 2021-12-11 LAB — PHOSPHORUS: Phosphorus: 4.1 mg/dL (ref 2.5–4.6)

## 2021-12-11 MED ORDER — FAMOTIDINE 20 MG PO TABS
20.0000 mg | ORAL_TABLET | Freq: Two times a day (BID) | ORAL | Status: DC
  Administered 2021-12-11 – 2021-12-13 (×4): 20 mg via ORAL
  Filled 2021-12-11 (×4): qty 1

## 2021-12-11 MED ORDER — BISACODYL 5 MG PO TBEC
5.0000 mg | DELAYED_RELEASE_TABLET | Freq: Every day | ORAL | Status: DC | PRN
  Administered 2021-12-11 – 2021-12-12 (×2): 5 mg via ORAL
  Filled 2021-12-11 (×2): qty 1

## 2021-12-11 MED ORDER — LORATADINE 10 MG PO TABS
10.0000 mg | ORAL_TABLET | Freq: Every day | ORAL | Status: DC
  Administered 2021-12-11 – 2021-12-13 (×3): 10 mg via ORAL
  Filled 2021-12-11 (×3): qty 1

## 2021-12-11 MED ORDER — DIAZEPAM 5 MG/ML IJ SOLN
5.0000 mg | Freq: Three times a day (TID) | INTRAMUSCULAR | Status: DC | PRN
  Administered 2021-12-11 – 2021-12-12 (×2): 5 mg via INTRAVENOUS
  Filled 2021-12-11 (×2): qty 2

## 2021-12-11 MED ORDER — DIPHENHYDRAMINE HCL 25 MG PO CAPS
25.0000 mg | ORAL_CAPSULE | Freq: Four times a day (QID) | ORAL | Status: DC | PRN
  Administered 2021-12-11: 25 mg via ORAL
  Filled 2021-12-11 (×3): qty 1

## 2021-12-11 MED ORDER — PREDNISONE 20 MG PO TABS
20.0000 mg | ORAL_TABLET | Freq: Two times a day (BID) | ORAL | Status: DC
  Administered 2021-12-11 – 2021-12-13 (×4): 20 mg via ORAL
  Filled 2021-12-11 (×4): qty 1

## 2021-12-11 NOTE — Plan of Care (Signed)
  Problem: Activity: Goal: Risk for activity intolerance will decrease Outcome: Progressing   Problem: Nutrition: Goal: Adequate nutrition will be maintained Outcome: Progressing   

## 2021-12-11 NOTE — Consult Note (Signed)
PHARMACY CONSULT NOTE ? ?Pharmacy Consult for Electrolyte Monitoring and Replacement  ? ?Recent Labs: ?Potassium (mmol/L)  ?Date Value  ?12/11/2021 3.6  ?10/23/2012 3.3 (L)  ? ?Magnesium (mg/dL)  ?Date Value  ?12/11/2021 2.2  ? ?Calcium (mg/dL)  ?Date Value  ?12/11/2021 9.1  ? ?Calcium, Total (mg/dL)  ?Date Value  ?10/23/2012 8.8  ? ?Albumin (g/dL)  ?Date Value  ?12/08/2021 3.0 (L)  ?10/23/2012 4.0  ? ?Phosphorus (mg/dL)  ?Date Value  ?12/11/2021 4.1  ? ?Sodium (mmol/L)  ?Date Value  ?12/11/2021 140  ?10/23/2012 137  ? ?Assessment: ?41 year old woman w/ h/o RA admitted to ICU with anaphylactic shock 2/2 Certolizumab pegol (Cimzia) Poison control contacted. Patient needed multiple epinephrine doses and given the long half life of cimzia will need extended supportive care. Transferred to CCU for further mgmt. Pharmacy consulted for electrolytes. ? ?Patient is lightly sedated on a Precedex gtt for alcohol withdrawal ? ?Goal of Therapy:  ?Electrolytes within normal limits ? ?Plan:  ?--No electrolyte replacement indicated at this time ?--Follow-up electrolytes with AM labs tomorrow ? ?Tressie EllisAlex B Milanni Ayub ?12/11/2021 7:32 AM ? ?

## 2021-12-11 NOTE — Consult Note (Signed)
[facility] Face-to-Face Psychiatry Consult   Reason for Consult: Consult for this 41 year old woman in the hospital recovering from an anaphylactic reaction.  Concern raised about possible causes of delirium including possible alcohol abuse Referring Physician: Karna Christmas Patient Identification: Phyllis Myers MRN:  [id number] Principal Diagnosis: Acute delirium Diagnosis:  Principal Problem:   Acute delirium Active Problems:   Anaphylactic shock   Total Time spent with patient: 1 hour  Subjective:   Phyllis Myers is a 41 y.o. female patient admitted with "I am finally feeling better".  HPI: Patient seen and chart reviewed.  Consult was requested last night when apparently the patient was still delirious.  41 year old woman presented to the hospital late last week with acute symptoms consistent with anaphylactic reaction.  She had just started the monoclonal antibody Cimzia for her rheumatoid arthritis and had the first dose on Thursday.  She reports that within a couple of hours she was flushing all over her body becoming confused feeling sick and that the confusion and weakness rapidly progressed.  She was able to reach her aunt on the telephone and was brought to the hospital and has been treated for what appears to have been an anaphylactic reaction.  During that time she had what sounds like an extended spell of delirium and confusion.  Notes report that the patient was making paranoid hostile accusations to family and to staff, that she got out of bed in the ICU and had to be restrained, at that she appeared to be having hallucinations.  Patient was placed on Precedex eventually although that has now been weaned off.  Concern was raised about possibility of alcohol withdrawal delirium.  Patient states that she drinks alcohol in the form of wine most days.  She says that sometimes she will drink a couple of glasses of wine in the evening.  She denies that she drinks as much as a full bottle of  wine a day.  She says the amount has not been increasing.  Patient denies feeling like she has any problem with alcohol abuse.  She says no one else in the family has ever told her she thought she had alcohol abuse.  She has never had any social or work difficulty from it.  She had a DUI when she was a teenager but no legal consequences any time since then.  Denies ever having had any symptoms of alcohol withdrawal despite having had several days at a time multiple times going without any alcohol.  Patient denies using any other drugs.  Denies that she was having any symptoms of depression or psychosis prior to this acute event.  Has minimal and only sketchy memories of the delirium over the last couple days.  Past Psychiatric History: Patient says she saw a therapist after her mother died a few years ago.  No history of hospitalizations.  No history of psychiatric medicine no history of suicide attempts.  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 [name]). Now off enbrel, methotrexate, uses indomethacin and hydrocodone rarely for pain.    Rheumatoid arthritis (HCC)     Past Surgical History:  Procedure Laterality Date   ABDOMINAL EXPOSURE N/A 09/06/2021   Procedure: ABDOMINAL EXPOSURE;  Surgeon: Annice Needy, MD;  Location: [facility] ORS;  Service: Vascular;  Laterality: N/A;   ANTERIOR LATERAL LUMBAR FUSION WITH PERCUTANEOUS SCREW 1 LEVEL N/A 09/06/2021   Procedure: L5-S1 ANTERIOR LATERAL LUMBAR  FUSION WITH PERCUTANEOUS SCREW;  Surgeon: [name], MD;  Location: [facility] ORS;  Service: Neurosurgery;  Laterality: N/A;   PERONEAL NERVE DECOMPRESSION Left 12/14/2019   Procedure: PERONEAL NERVE DECOMPRESSION;  Surgeon: [name], MD;  Location: [facility] ORS;  Service: Neurosurgery;  Laterality: Left;   WISDOM TOOTH EXTRACTION     Family History:  Family History  Problem Relation Age of Onset   Pulmonary Hypertension Mother    Cardiomyopathy Mother    Heart disease Mother    Heart disease Father 19       MI   Heart disease Maternal Grandfather    Heart disease Paternal Grandfather    Family Psychiatric  History: None reported Social History:  Social History   Substance and Sexual Activity  Alcohol Use Yes   Alcohol/week: 4.0 - 6.0 standard drinks   Types: 4 - 6 Glasses of wine per week   Comment: nightly     Social History   Substance and Sexual Activity  Drug Use No    Social History   Socioeconomic History   Marital status: Single    Spouse name: Not on file   Number of children: 0   Years of education: Not on file   Highest education level: Not on file  Occupational History   Not on file  Tobacco Use   Smoking status: Never   Smokeless tobacco: Never  Vaping Use   Vaping Use: Never used  Substance and Sexual Activity   Alcohol use: Yes    Alcohol/week: 4.0 - 6.0 standard drinks    Types: 4 - 6 Glasses of wine per week    Comment: nightly   Drug use: No   Sexual activity: Yes    Birth control/protection: Condom, I.U.D.  Other Topics Concern   Not on file  Social History Narrative   no kids.   Social Determinants of Health   Financial Resource Strain: Not on file  Food Insecurity: Not on file  Transportation Needs: Not on file  Physical Activity: Not on file  Stress: Not on file  Social Connections: Not on file   Additional Social History:    Allergies:   Allergies  Allergen Reactions   Cimzia [Certolizumab Pegol] Anaphylaxis    Amoxicillin     Muscle pain   Naproxen Sodium Rash    Heavy doses    Labs:  Results for orders placed or performed during the hospital encounter of 12/07/21 (from the past 48 hour(s))  Glucose, capillary     Status: Abnormal   Collection Time: 12/09/21  4:06 PM  Result Value Ref Range   Glucose-Capillary 147 (H) 70 - 99 mg/dL    Comment: Glucose reference range applies only to samples taken after fasting for at least 8 hours.  Glucose, capillary     Status: Abnormal   Collection Time: 12/09/21  8:44 PM  Result Value Ref Range   Glucose-Capillary 114 (H) 70 - 99 mg/dL    Comment: Glucose reference range applies only to samples taken after fasting for  at least 8 hours.  Glucose, capillary     Status: Abnormal   Collection Time: 12/09/21 10:00 PM  Result Value Ref Range   Glucose-Capillary 105 (H) 70 - 99 mg/dL    Comment: Glucose reference range applies only to samples taken after fasting for at least 8 hours.   Comment 1 Notify RN    Comment 2 Document in Chart   Basic metabolic panel     Status: Abnormal   Collection Time: 12/10/21  5:28 AM  Result Value Ref Range   Sodium 140 135 - 145 mmol/L   Potassium 3.9 3.5 - 5.1 mmol/L   Chloride 105 98 - 111 mmol/L   CO2 27 22 - 32 mmol/L   Glucose, Bld 138 (H) 70 - 99 mg/dL    Comment: Glucose reference range applies only to samples taken after fasting for at least 8 hours.   BUN 14 6 - 20 mg/dL   Creatinine, Ser 1.61 0.44 - 1.00 mg/dL   Calcium 9.5 8.9 - 09.6 mg/dL   GFR, Estimated >04 >54 mL/min    Comment: (NOTE) Calculated using the CKD-EPI Creatinine Equation (2021)    Anion gap 8 5 - 15    Comment: Performed at [facility], [address] [place], [place] [address]  Magnesium     Status: None   Collection Time: 12/10/21  5:28 AM  Result Value Ref Range   Magnesium 1.8 1.7 - 2.4 mg/dL    Comment: Performed at [facility], [address] [place], [place] [address]  Phosphorus     Status: None    Collection Time: 12/10/21  5:28 AM  Result Value Ref Range   Phosphorus 3.1 2.5 - 4.6 mg/dL    Comment: Performed at [facility], [address] [place], [place] [address]  CBC     Status: Abnormal   Collection Time: 12/10/21  5:28 AM  Result Value Ref Range   WBC 12.4 (H) 4.0 - 10.5 K/uL   RBC 3.80 (L) 3.87 - 5.11 MIL/uL   Hemoglobin 12.1 12.0 - 15.0 g/dL   HCT 13.0 86.5 - 78.4 %   MCV 95.3 80.0 - 100.0 fL   MCH 31.8 26.0 - 34.0 pg   MCHC 33.4 30.0 - 36.0 g/dL   RDW 69.6 29.5 - 28.4 %   Platelets 153 150 - 400 K/uL   nRBC 0.0 0.0 - 0.2 %    Comment: Performed at [facility], [address] [place], [place] [address]  Glucose, capillary     Status: Abnormal   Collection Time: 12/10/21  7:59 AM  Result Value Ref Range   Glucose-Capillary 124 (H) 70 - 99 mg/dL    Comment: Glucose reference range applies only to samples taken after fasting for at least 8 hours.  Glucose, capillary     Status: Abnormal   Collection Time: 12/10/21 11:38 AM  Result Value Ref Range   Glucose-Capillary 116 (H) 70 - 99 mg/dL    Comment: Glucose reference range applies only to samples taken after fasting for at least 8 hours.  Glucose, capillary     Status: Abnormal   Collection Time: 12/10/21  4:14 PM  Result Value Ref Range   Glucose-Capillary 143 (H) 70 - 99 mg/dL    Comment: Glucose reference range applies only to samples taken after fasting for at least 8 hours.  Glucose, capillary     Status: Abnormal   Collection Time: 12/10/21  9:08 PM  Result Value  Ref Range   Glucose-Capillary 134 (H) 70 - 99 mg/dL    Comment: Glucose reference range applies only to samples taken after fasting for at least 8 hours.   Comment 1 Notify RN    Comment 2 Document in Chart   Basic metabolic panel     Status: Abnormal   Collection Time: 12/11/21  3:26 AM  Result Value Ref Range   Sodium 140 135 - 145 mmol/L   Potassium 3.6 3.5 - 5.1 mmol/L   Chloride 104 98 - 111 mmol/L   CO2 26 22 - 32  mmol/L   Glucose, Bld 151 (H) 70 - 99 mg/dL    Comment: Glucose reference range applies only to samples taken after fasting for at least 8 hours.   BUN 12 6 - 20 mg/dL   Creatinine, Ser 1.61 0.44 - 1.00 mg/dL   Calcium 9.1 8.9 - 09.6 mg/dL   GFR, Estimated >04 >54 mL/min    Comment: (NOTE) Calculated using the CKD-EPI Creatinine Equation (2021)    Anion gap 10 5 - 15    Comment: Performed at [facility], [address] [place], [place] [address]  Magnesium     Status: None   Collection Time: 12/11/21  3:26 AM  Result Value Ref Range   Magnesium 2.2 1.7 - 2.4 mg/dL    Comment: Performed at [facility], [address] [place], [place] [address]  Phosphorus     Status: None   Collection Time: 12/11/21  3:26 AM  Result Value Ref Range   Phosphorus 4.1 2.5 - 4.6 mg/dL    Comment: Performed at [facility], [address] [place], [place] [address]  CBC     Status: None   Collection Time: 12/11/21  3:26 AM  Result Value Ref Range   WBC 8.5 4.0 - 10.5 K/uL   RBC 4.06 3.87 - 5.11 MIL/uL   Hemoglobin 13.1 12.0 - 15.0 g/dL   HCT 13.0 86.5 - 78.4 %   MCV 93.1 80.0 - 100.0 fL   MCH 32.3 26.0 - 34.0 pg   MCHC 34.7 30.0 - 36.0 g/dL   RDW 69.6 29.5 - 28.4 %   Platelets 153 150 - 400 K/uL   nRBC 0.0 0.0 - 0.2 %    Comment: Performed at [facility], [address] [place], [place] [address]  Glucose, capillary     Status: Abnormal   Collection Time: 12/11/21  8:30 AM  Result Value Ref Range   Glucose-Capillary 138 (H) 70 - 99 mg/dL    Comment: Glucose reference range applies only to samples taken after fasting for at least 8 hours.  Glucose, capillary     Status: Abnormal   Collection Time: 12/11/21 11:39 AM  Result Value Ref Range   Glucose-Capillary 108 (H) 70 - 99 mg/dL    Comment: Glucose reference range applies only to samples taken after fasting for at least 8 hours.    Current Facility-Administered Medications  Medication Dose  Route Frequency Provider Last Rate Last Admin   0.9 %  sodium chloride infusion   Intravenous PRN Vida Rigger, MD 10 mL/hr at 12/11/21 0700 Infusion Verify at 12/11/21 0700   acetaminophen (TYLENOL) tablet 650 mg  650 mg Oral Q4H PRN Rust-Chester, Britton L, NP       albuterol (PROVENTIL) (2.5 MG/3ML) 0.083% nebulizer solution 2.5 mg  2.5 mg Nebulization Q2H PRN Rust-Chester, Cecelia Byars, NP       bisacodyl (DULCOLAX) EC tablet 5  mg  5 mg Oral Daily PRN Rust-Chester, Cecelia Byars, NP   5 mg at 12/11/21 1610   Chlorhexidine Gluconate Cloth 2 % PADS 6 each  6 each Topical Q0600 Vida Rigger, MD   6 each at 12/11/21 0622   diazepam (VALIUM) injection 5 mg  5 mg Intravenous Q8H PRN Erin Fulling, MD       diphenhydrAMINE (BENADRYL) capsule 25 mg  25 mg Oral Q6H PRN Erin Fulling, MD       famotidine (PEPCID) tablet 20 mg  20 mg Oral BID Erin Fulling, MD       folic acid (FOLVITE) tablet 1 mg  1 mg Oral Daily Rust-Chester, Cecelia Byars, NP   1 mg at 12/11/21 9604   hydrALAZINE (APRESOLINE) injection 10-20 mg  10-20 mg Intravenous Q4H PRN Rust-Chester, Cecelia Byars, NP   20 mg at 12/11/21 0109   insulin aspart (novoLOG) injection 0-15 Units  0-15 Units Subcutaneous TID WC Ezequiel Essex, NP   2 Units at 12/10/21 1710   insulin aspart (novoLOG) injection 0-5 Units  0-5 Units Subcutaneous QHS Ezequiel Essex, NP       loratadine (CLARITIN) tablet 10 mg  10 mg Oral Daily Erin Fulling, MD   10 mg at 12/11/21 1218   multivitamin with minerals tablet 1 tablet  1 tablet Oral Daily Rust-Chester, Cecelia Byars, NP   1 tablet at 12/11/21 0842   ondansetron (ZOFRAN) injection 4 mg  4 mg Intravenous Q6H PRN Rust-Chester, Cecelia Byars, NP       predniSONE (DELTASONE) tablet 20 mg  20 mg Oral BID WC Erin Fulling, MD       sertraline (ZOLOFT) tablet 25 mg  25 mg Oral Daily Rust-Chester, Britton L, NP   25 mg at 12/09/21 0834   [START ON 12/14/2021] thiamine tablet 250 mg  250 mg Oral Daily Rust-Chester, Cecelia Byars, NP       Or    [START ON 12/14/2021] thiamine (B-1) injection 250 mg  250 mg Intravenous Daily Rust-Chester, Britton L, NP       thiamine 500mg  in normal saline (50ml) IVPB  500 mg Intravenous Daily Rust-Chester, Cecelia Byars, NP   Stopped at 12/10/21 2222   [START ON 12/17/2021] thiamine tablet 100 mg  100 mg Oral Daily Rust-Chester, Cecelia Byars, NP        Musculoskeletal: Strength & Muscle Tone: within normal limits Gait & Station: normal Patient leans: N/A            Psychiatric Specialty Exam:  Presentation  General Appearance: No data recorded Eye Contact:No data recorded Speech:No data recorded Speech Volume:No data recorded Handedness:No data recorded  Mood and Affect  Mood:No data recorded Affect:No data recorded  Thought Process  Thought Processes:No data recorded Descriptions of Associations:No data recorded Orientation:No data recorded Thought Content:No data recorded History of Schizophrenia/Schizoaffective disorder:No data recorded Duration of Psychotic Symptoms:No data recorded Hallucinations:No data recorded Ideas of Reference:No data recorded Suicidal Thoughts:No data recorded Homicidal Thoughts:No data recorded  Sensorium  Memory:No data recorded Judgment:No data recorded Insight:No data recorded  Executive Functions  Concentration:No data recorded Attention Span:No data recorded Recall:No data recorded Fund of Knowledge:No data recorded Language:No data recorded  Psychomotor Activity  Psychomotor Activity:No data recorded  Assets  Assets:No data recorded  Sleep  Sleep:No data recorded  Physical Exam: Physical Exam Vitals and nursing note reviewed.  Constitutional:      Appearance: Normal appearance.  HENT:     Head: Normocephalic and atraumatic.  Mouth/Throat:     Pharynx: Oropharynx is clear.  Eyes:     Pupils: Pupils are equal, round, and reactive to light.  Cardiovascular:     Rate and Rhythm: Normal rate and regular rhythm.  Pulmonary:      Effort: Pulmonary effort is normal.     Breath sounds: Normal breath sounds.  Abdominal:     General: Abdomen is flat.     Palpations: Abdomen is soft.  Musculoskeletal:        General: Normal range of motion.  Skin:    General: Skin is warm and dry.  Neurological:     General: No focal deficit present.     Mental Status: She is alert. Mental status is at baseline.  Psychiatric:        Attention and Perception: Attention normal.        Mood and Affect: Mood normal.        Speech: Speech normal.        Behavior: Behavior is cooperative.        Thought Content: Thought content normal.        Cognition and Memory: She exhibits impaired recent memory.        Judgment: Judgment normal.   Review of Systems  Constitutional: Negative.   HENT: Negative.    Eyes: Negative.   Respiratory: Negative.    Cardiovascular: Negative.   Gastrointestinal: Negative.   Musculoskeletal: Negative.   Skin: Negative.   Neurological: Negative.   Psychiatric/Behavioral:  Positive for memory loss. Negative for depression, hallucinations, substance abuse and suicidal ideas. The patient is not nervous/anxious and does not have insomnia.   Blood pressure 97/79, pulse 68, temperature 98 F (36.7 C), resp. rate (!) 25, height 5\' 5"  (1.651 m), weight 75.3 kg, SpO2 98 %. Body mass index is 27.62 kg/m.  Treatment Plan Summary: Plan patient today is off of Precedex and off of all IV medicines.  She is alert awake calm and appropriate.  No sign of jitteriness.  No sign of confusion.  Able to tell a clear and lucid story.  Denies any auditory or visual hallucinations.  No alcohol level or drug screen was obtained on admission.  Liver enzymes were all normal.  As noted previously there had not been any prior concern expressed about alcohol withdrawal.  Patient was having an anaphylactic reaction and was treated as would be expected with multiple doses of intravenous steroid.  Multiple reasons to be delirious over  the last few days.  Delirium tremens does not seem to be one of them.  Patient has been prescribed Valium for panic attacks and oxycodone after her recent surgery but the database shows she has not had the prescriptions filled since December which is what she tells me.  She denies that she used these medicines to excess and says in fact she intentionally chose to stop taking them on her own.  No indication for any psychiatric intervention at this point.  Wished the patient well.  Disposition: No evidence of imminent risk to self or others at present.   Patient does not meet criteria for psychiatric inpatient admission. Supportive therapy provided about ongoing stressors.  Mordecai Rasmussen, MD 12/11/2021 4:19 PM

## 2021-12-11 NOTE — Progress Notes (Signed)
NAME:  Phyllis Myers, MRN:  [id number], DOB:  03-05-1981, LOS: 4 ADMISSION DATE:  12/07/2021, CONSULTATION DATE:  12/07/21 REFERRING MD:  Dr. Vicente Males, CHIEF COMPLAINT:   Allergic reaction  History of Present Illness:  41 yo F presenting to [facility] ED via EMS with anaphylaxis after an injection of a new biologic, Cimzia- Certolizumab, via IM on 12/07/21 at 4 pm. Patient returned home and about 1.5 hours later broke out into an itchy red rash, with complaints of loss of vision, wheezing, nausea & light headedness. She is not sure what happened but fell onto her kitchen floor. Per ED documentation, EMS found the patient hypotensive in the 60's/40's. She received 2 IM doses of epinephrine with improvement of some symptoms. Patient transported to ED without incident. ED course: After arrival patient's symptoms began to return within one hour. She received a bolus of epinephrine IV continued with epinephrine drip to control symptoms. Medications given: (with EMS 2 IM doses of epi & 300 mL NS), 1 L NS bolus, epi bolus 0.3 mg & epinephrine drip initiated, Zofran IV 4 mg Initial Vitals: 97.4, tachypneic 26, tachycardic 125, BP 148/108 & SpO2 99% on room air Significant labs: (Labs/ Imaging personally reviewed) I, Cheryll Cockayne Rust-Chester, AGACNP-BC, personally viewed and interpreted this ECG. EKG Interpretation: Date: 12/07/21, EKG Time: 19:03, Rate: 130,  Rhythm: Sinus Tachycardia, QRS Axis:  normal, Intervals: normal,  ST/T Wave abnormalities: mild ST depression V3-V6, Narrative Interpretation: sinus tachycardia with mild ST depression in lateral leads Chemistry: Na+:136, K+: 3.8, BUN/Cr.: 9/0.72, Serum CO2/ AG: 13/19, AST: 48, total bili: 1.5 Hematology: WBC: 13.4, Hgb: 15.6,  Troponin: 20, Lactic: pending, COVID-19 & Influenza A/B: negative VBG: pending  PCCM consulted for admission due to Anaphylaxis reaction requiring continuous epinephrine infusion for symptom control.  12/08/21- patient seen and  evaluated at beside , significantly improved, s/p rheumatology evaluation, anticipate several days of therapy due to prolonged half life.   12/09/21- patient appears to be undergoing withdrawal from EtOH abuse. She admits to EtOH abuse and is now on CIWA protocol. Patient s/p treatment with precedex with immediate improvement and resting comfortably. Epinephrine gtt is being weaned down.   12/10/21- patient with auditory and visual hallucinations, reports being kidnapped held against her will. Have requested psychiatric consultation. Dr Toni Amend was able to answer via secure chat.  She remains on epi gtt for rash due to anaphylaxis and precedex gtt for severe EtOH withdrawal.    3/13 remains on precedex, EPI infusion   Pertinent  Medical History  Rheumatoid arthritis GAD Depression Hemochromatosis HTN GERD  INTERVAL HISTIORY Alert and awake  Follows commands Not in any distress No tremors Weaned off PRECEDEX and EPI drip for 20 mins and no symtpoms Plan to obtain Midline D/c CVL Out of bed to chair  Objective   Blood pressure (!) 147/94, pulse 64, temperature 97.7 F (36.5 C), temperature source Axillary, resp. rate 15, height  (1.651 m), weight 75.3 kg, SpO2 95 %.        Intake/Output Summary (Last 24 hours) at 12/11/2021 0733 Last data filed at 12/11/2021 0700 Gross per 24 hour  Intake 888.3 ml  Output 0 ml  Net 888.3 ml    Filed Weights   12/07/21 2100 12/09/21 0500 12/11/21 0500  Weight: 82.6 kg 83.2 kg 75.3 kg     Review of Systems:  Gen:  Denies  fever, sweats, chills weight loss  HEENT: Denies blurred vision, double vision, ear pain, eye pain, hearing loss, nose  bleeds, sore throat Cardiac:  No dizziness, chest pain or heaviness, chest tightness,edema, No JVD Resp:   No cough, -sputum production, -shortness of breath,-wheezing, -hemoptysis,  Other:  All other systems negative      Physical Examination:   General Appearance: No distress  EYES  PERRLA, EOM intact.   NECK Supple, No JVD Pulmonary: normal breath sounds, No wheezing.  CardiovascularNormal S1,S2.  No m/r/g.   Abdomen: Benign, Soft, non-tender. Skin:   warm, no rashes, no ecchymosis  Extremities: normal, no cyanosis, clubbing. Neuro:without focal findings PSYCHIATRIC: severe agitation   ALL OTHER ROS ARE NEGATIVE    Assessment & Plan:  Admitted for Suspected Anaphylactic Reaction secondary to first time exposure to Cimzia (Certolizumab) Blurred Vision in the setting of anaphylaxis, query optic neuritis? Complicated by DT's    [place] poison control  received recommendations from Dr. Algis Downs. [place] that are outlined below. Peak time for this medication is 54-171 hours.  - Vision is improving with treatment, continue to monitor - solu-medrol 125 mg x 1, reassess in 4 hours > if still symptomatic repeat solu-medrol 40 mg Q 4 h as needed - benadryl 50 mg IV Q 4 h -epinephrine drip-titrate to off - bronchodilators & ICS PRN with respiratory symptoms - Zofran IV PRN - monitor LFT's & renal function due to risk for nephrotic syndrome/ renal failure, hepatitis - NO BETA BLOCKERS Start ZYRTEC -rheumatotlogy consult - appreciate input- Dr [name]   ?ALCOHOL WITHDRAWAL -Therapy with Thiamine and MVI -CIWA Protocol  KIDNEY -continue Foley Catheter-assess need -Avoid nephrotoxic agents -Follow urine output, BMP -Ensure adequate renal perfusion, optimize oxygenation -Renal dose medications  Intake/Output Summary (Last 24 hours) at 12/11/2021 0738 Last data filed at 12/11/2021 0700 Gross per 24 hour  Intake 888.3 ml  Output 0 ml  Net 888.3 ml   BMP Latest Ref Rng & Units 12/11/2021 12/10/2021 12/09/2021  Glucose 70 - 99 mg/dL 122(E) 497(N) 300(F)  BUN 6 - 20 mg/dL 12 14 7   Creatinine 0.44 - 1.00 mg/dL 1.10 2.11 1.73  Sodium 135 - 145 mmol/L 140 140 139  Potassium 3.5 - 5.1 mmol/L 3.6 3.9 3.9  Chloride 98 - 111 mmol/L 104 105 108  CO2 22 - 32 mmol/L 26 27 24    Calcium 8.9 - 10.3 mg/dL 9.1 9.5 5.6(P)    Elevated Troponin secondary to N-STEMI vs demand ischemia, suspect demand ischemia - Trend troponins  - consider Echocardiogram & systemic anticoagulation if needed - f/u EKG in AM - NO BETA BLOCKERS per CDC for tachycardia  - continuous cardiac monitoring   ENDO - ICU hypoglycemic\Hyperglycemia protocol -check FSBS per protocol   GI GI PROPHYLAXIS as indicated  NUTRITIONAL STATUS DIET--> as tolerated Constipation protocol as indicated   ELECTROLYTES -follow labs as needed -replace as needed -pharmacy consultation and following    Best Practice (right click and "Reselect all SmartList Selections" daily)  Diet/type: NPO w/ oral meds DVT prophylaxis: prophylactic heparin  GI prophylaxis: PPI Lines: Central line (will need placement) Foley:  N/A Code Status:  full code   Labs   CBC: Recent Labs  Lab 12/07/21 1817 12/08/21 0624 12/10/21 0528 12/11/21 0326  WBC 13.4* 10.3 12.4* 8.5  NEUTROABS 9.3* 9.9*  --   --   HGB 15.6* 13.6 12.1 13.1  HCT 45.7 39.4 36.2 37.8  MCV 93.8 93.6 95.3 93.1  PLT 299 230 153 153     Basic Metabolic Panel: Recent Labs  Lab 12/08/21 0517 12/08/21 0624 12/08/21 1444 12/08/21 2010 12/09/21 0447  12/10/21 0528 12/11/21 0326  NA 137  --  140  --  139 140 140  K 3.1*  --  3.9  --  3.9 3.9 3.6  CL 106  --  110  --  108 105 104  CO2 16*  --  22  --  24 27 26   GLUCOSE 252*  --  243*  --  200* 138* 151*  BUN 8  --  6  --  7 14 12   CREATININE 0.92  --  0.62  --  0.49 0.84 0.55  CALCIUM 8.1*  --  8.4*  --  8.8* 9.5 9.1  MG 1.2* 1.6*  --  2.0 1.9 1.8 2.2  PHOS 2.6  --  1.2*  --  2.5 3.1 4.1    GFR: Estimated Creatinine Clearance: 93 mL/min (by C-G formula based on SCr of 0.55 mg/dL). Recent Labs  Lab 12/07/21 1817 12/07/21 2121 12/08/21 0020 12/08/21 0517 12/08/21 0624 12/08/21 0924 12/10/21 0528 12/11/21 0326  PROCALCITON  --   --   --   --   --  0.24  --   --   WBC 13.4*   --   --   --  10.3  --  12.4* 8.5  LATICACIDVEN  --  3.6* 5.5* 8.9*  --  5.1*  --   --      Liver Function Tests: Recent Labs  Lab 12/07/21 1817 12/08/21 0517 12/08/21 1444  AST 48* 41  --   ALT 22 23  --   ALKPHOS 62 52  --   BILITOT 1.5* 1.0  --   PROT 6.8 6.0*  --   ALBUMIN 3.5 3.3* 3.0*    No results for input(s): LIPASE, AMYLASE in the last 168 hours. No results for input(s): AMMONIA in the last 168 hours.  ABG    Component Value Date/Time   HCO3 18.8 (L) 12/07/2021 2121   TCO2 24 12/14/2019 0635   ACIDBASEDEF 3.8 (H) 12/07/2021 2121   O2SAT 96.9 12/07/2021 2121      HbA1C: Hgb A1c MFr Bld  Date/Time Value Ref Range Status  12/08/2021 09:24 AM 5.2 4.8 - 5.6 % Final    Comment:    (NOTE)         Prediabetes: 5.7 - 6.4         Diabetes: >6.4         Glycemic control for adults with diabetes: <7.0   01/31/2021 04:45 PM 4.6 4.6 - 6.5 % Final    Comment:    Glycemic Control Guidelines for People with Diabetes:Non Diabetic:  <6%Goal of Therapy: <7%Additional Action Suggested:  >8%     CBG: Recent Labs  Lab 12/09/21 2200 12/10/21 0759 12/10/21 1138 12/10/21 1614 12/10/21 2108  GLUCAP 105* 124* 116* 143* 134*     Past Medical History:  She,  has a past medical history of Depression, major, single episode, mild (HCC) (12/20/2016), Generalized anxiety disorder (09/08/2013), GERD (gastroesophageal reflux disease), Hemochromatosis, Hypertension, Panic disorder (07/21/2014), Pneumonia, 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   Surgical History:   Past Surgical History:  Procedure Laterality Date   ABDOMINAL EXPOSURE N/A 09/06/2021   Procedure: ABDOMINAL EXPOSURE;  Surgeon: Annice Needyew, Jason S, MD;  Location: [facility] ORS;  Service: Vascular;  Laterality: N/A;   ANTERIOR LATERAL LUMBAR FUSION WITH PERCUTANEOUS SCREW 1 LEVEL N/A 09/06/2021   Procedure: L5-S1 ANTERIOR LATERAL LUMBAR FUSION WITH PERCUTANEOUS SCREW;  Surgeon: Venetia NightYarbrough,  Chester, MD;  Location: [facility] ORS;  Service: Neurosurgery;  Laterality: N/A;   PERONEAL NERVE DECOMPRESSION Left 12/14/2019   Procedure: PERONEAL NERVE DECOMPRESSION;  Surgeon: [name], MD;  Location: [facility] ORS;  Service: Neurosurgery;  Laterality: Left;   WISDOM TOOTH EXTRACTION       Allergies Allergies  Allergen Reactions   Cimzia [Certolizumab Pegol] Anaphylaxis   Amoxicillin     Muscle pain   Naproxen Sodium Rash    Heavy doses     Home Medications  Prior to Admission medications   Medication Sig Start Date End Date Taking? Authorizing Provider  apixaban (ELIQUIS) 2.5 MG TABS tablet Take 1 tablet (2.5 mg total) by mouth 2 (two) times daily. 09/09/21   [name], MD  celecoxib (CELEBREX) 100 MG capsule Take 1 capsule (100 mg total) by mouth 2 (two) times daily. 09/09/21 12/08/21  [name], MD  diazepam (VALIUM) 5 MG tablet TAKE 1 TABLET(5 MG) BY MOUTH EVERY 8 HOURS AS NEEDED FOR ANXIETY 06/14/21   [name], [name], MD  levonorgestrel [facility]) 19.5 MG IUD Kyleena 17.5 mcg/24 hrs (79yrs) 19.5mg  intrauterine device 12/02/17   [provider]  methocarbamol (ROBAXIN) 500 MG tablet Take 1 tablet (500 mg total) by mouth every 6 (six) hours as needed for muscle spasms. 09/09/21   [name], MD  omeprazole (PRILOSEC) 40 MG capsule TAKE 1 CAPSULE(40 MG) BY MOUTH DAILY 07/24/21   [name], [name], MD  oxyCODONE (OXY IR/ROXICODONE) 5 MG immediate release tablet Take 1 tablet (5 mg total) by mouth every 3 (three) hours as needed for moderate pain ((score 4 to 6)). 09/09/21   [name], MD  senna-docusate (SENOKOT-S) 8.6-50 MG tablet Take 1 tablet by mouth 2 (two) times daily as needed for mild constipation. 09/09/21   [name], MD  sertraline (ZOLOFT) 25 MG tablet Take 1 tablet (25 mg total) by mouth daily. 01/04/20   [name], MD      DVT/GI PRX  assessed I Assessed the need for Labs I Assessed the need for Foley I Assessed the  need for Central Venous Line Family Discussion when available I Assessed the need for Mobilization I made an Assessment of medications to be adjusted accordingly Safety Risk assessment completed  CASE DISCUSSED IN MULTIDISCIPLINARY ROUNDS WITH ICU TEAM   Alejandra Barna Santiago Glad, M.D.  [facility]  Medical Director [facility] Medical Director [facility] Cardio-Pulmonary Department

## 2021-12-12 ENCOUNTER — Encounter: Payer: Self-pay | Admitting: Oncology

## 2021-12-12 LAB — BASIC METABOLIC PANEL
Anion gap: 7 (ref 5–15)
BUN: 20 mg/dL (ref 6–20)
CO2: 27 mmol/L (ref 22–32)
Calcium: 8.7 mg/dL — ABNORMAL LOW (ref 8.9–10.3)
Chloride: 104 mmol/L (ref 98–111)
Creatinine, Ser: 0.56 mg/dL (ref 0.44–1.00)
GFR, Estimated: >60 mL/min (ref >60)
Glucose, Bld: 119 mg/dL — ABNORMAL HIGH (ref 70–99)
Potassium: 3.9 mmol/L (ref 3.5–5.1)
Sodium: 138 mmol/L (ref 135–145)

## 2021-12-12 LAB — MAGNESIUM: Magnesium: 2.2 mg/dL (ref 1.7–2.4)

## 2021-12-12 LAB — GLUCOSE, CAPILLARY
Glucose-Capillary: 85 mg/dL (ref 70–99)
Glucose-Capillary: 114 mg/dL — ABNORMAL HIGH (ref 70–99)
Glucose-Capillary: 150 mg/dL — ABNORMAL HIGH (ref 70–99)
Glucose-Capillary: 102 mg/dL — ABNORMAL HIGH (ref 70–99)

## 2021-12-12 MED ORDER — CHLORHEXIDINE GLUCONATE CLOTH 2 % EX PADS
6.0000 | MEDICATED_PAD | Freq: Every day | CUTANEOUS | Status: DC
  Administered 2021-12-12: 6 via TOPICAL

## 2021-12-12 NOTE — Consult Note (Signed)
PHARMACY CONSULT NOTE ? ?Pharmacy Consult for Electrolyte Monitoring and Replacement  ? ?Recent Labs: ?Potassium (mmol/L)  ?Date Value  ?12/12/2021 3.9  ?10/23/2012 3.3 (L)  ? ?Magnesium (mg/dL)  ?Date Value  ?12/12/2021 2.2  ? ?Calcium (mg/dL)  ?Date Value  ?12/12/2021 8.7 (L)  ? ?Calcium, Total (mg/dL)  ?Date Value  ?10/23/2012 8.8  ? ?Albumin (g/dL)  ?Date Value  ?12/08/2021 3.0 (L)  ?10/23/2012 4.0  ? ?Phosphorus (mg/dL)  ?Date Value  ?12/11/2021 4.1  ? ?Sodium (mmol/L)  ?Date Value  ?12/12/2021 138  ?10/23/2012 137  ? ?Assessment: ?41 year old woman w/ h/o RA admitted to ICU with anaphylactic shock 2/2 Certolizumab pegol (Cimzia) Poison control contacted. Patient needed multiple epinephrine doses and given the long half life of cimzia will need extended supportive care. Transferred to CCU for further mgmt. Pharmacy consulted for electrolytes. ? ?Goal of Therapy:  ?Electrolytes within normal limits ? ?Plan:  ?--No electrolyte replacement indicated at this time ?--because this consult was generated as part of a CCU order set, and the patient is being transferred out of CCU, pharmacy will sign out for now ? ?Lowella BandyRodney D Enedina Pair ?12/12/2021 6:57 AM ? ?

## 2021-12-12 NOTE — Progress Notes (Signed)
?PROGRESS NOTE ? ? ?HPI was taken from NP Rust-Chester: ?41 yo F presenting to [facility] ED via EMS with anaphylaxis after an injection of a new biologic, Cimzia- Certolizumab, via IM on 12/07/21 at 4 pm. Patient returned home and about 1.5 hours later broke out into an itchy red rash, with complaints of loss of vision, wheezing, nausea & light headedness. She is not sure what happened but fell onto her kitchen floor. Per ED documentation, EMS found the patient hypotensive in the 60's/40's. She received 2 IM doses of epinephrine with improvement of some symptoms. Patient transported to ED without incident. ?ED course: ?After arrival patient's symptoms began to return within one hour. She received a bolus of epinephrine IV continued with epinephrine drip to control symptoms. ?Medications given: (with EMS 2 IM doses of epi & 300 mL NS), 1 L NS bolus, epi bolus 0.3 mg & epinephrine drip initiated, Zofran IV 4 mg ?Initial Vitals: 97.4, tachypneic 26, tachycardic 125, BP 148/108 & SpO2 99% on room air ?Significant labs: (Labs/ Imaging personally reviewed) ?I, Domingo Pulse Rust-Chester, AGACNP-BC, personally viewed and interpreted this ECG. ?EKG Interpretation: Date: 12/07/21, EKG Time: 19:03, Rate: 130,  ?Rhythm: Sinus Tachycardia, QRS Axis:  normal, Intervals: normal,  ?ST/T Wave abnormalities: mild ST depression V3-V6, Narrative Interpretation: sinus tachycardia with mild ST depression in lateral leads ?Chemistry: Na+:136, K+: 3.8, BUN/Cr.: 9/0.72, Serum CO2/ AG: 13/19, AST: 48, total bili: 1.5 ?Hematology: WBC: 13.4, Hgb: 15.6,  ?Troponin: 20, Lactic: pending, COVID-19 & Influenza A/B: negative ?VBG: pending ?  ?PCCM consulted for admission due to Anaphylaxis reaction requiring continuous epinephrine infusion for symptom control. ? ? ?As per Dr. Mortimer Fries: ?12/08/21- patient seen and evaluated at beside , significantly improved, s/p rheumatology evaluation, anticipate several days of therapy due to prolonged half life.  ?  ?12/09/21-  patient appears to be undergoing withdrawal from EtOH abuse. She admits to EtOH abuse and is now on CIWA protocol. Patient s/p treatment with precedex with immediate improvement and resting comfortably. Epinephrine gtt is being weaned down.  ?  ?12/10/21- patient with auditory and visual hallucinations, reports being kidnapped held against her will. Have requested psychiatric consultation. Dr Weber Cooks was able to answer via secure chat.  She remains on epi gtt for rash due to anaphylaxis and precedex gtt for severe EtOH withdrawal.   ?  ?3/13 remains on precedex, EPI infusion ? ? ?Phyllis Myers  [id number] DOB: Jan 04, 1981 DOA: 12/07/2021 ?PCP: Owens Loffler, MD  ? ?Assessment & Plan: ?  ?Principal Problem: ?  Acute delirium ?Active Problems: ?  Anaphylactic shock ? ? ?Anaphylactic reaction to cimzia: continue on steroids, pepcid & bronchodilators. S/p epinephrine drip and since has been d/c. See ICU note for more information  ? ?Possible alcohol w/drawal: continue w/ thiamine, multivitamin and CIWA protocol. No alcohol level or drug screen was done on admission. No longer on precedex drip  ? ?Elevated troponins: likely secondary to demand ischemia. NSTEMI r/o  ? ?Hyperglycemia: no hx of DM. HbA1c 5.2 ? ?Hallucinations: auditory & visual. Psych consulted and pt does not meet inpatient psych criteria. See Dr. Weber Cooks notes for more information  ? ? ? ? ?DVT prophylaxis: SCDs ?Code Status: full  ?Family Communication: discussed pt's care w/ pt's family at bedside and answered their questions  ?Disposition Plan: (specify when and where you expect patient to be discharged). Include barriers to DC in this tab. ? ?Level of care: Stepdown ? ?Status is: Inpatient ?Remains inpatient appropriate because: severity of illness ? ? ?  Consultants:  ?ICU ?Psychiatry  ? ?Procedures:  ? ?Antimicrobials:  ? ?Subjective: ?Pt c/o malaise  ? ?Objective: ?Vitals:  ? 12/12/21 0500 12/12/21 0600 12/12/21 0700 12/12/21 0730  ?BP: (!)  129/91 114/86 (!) 144/104 (!) 138/101  ?Pulse: 72 67 93 86  ?Resp: 16 16 (!) 21 17  ?Temp:    98.3 ?F (36.8 ?C)  ?TempSrc:    Oral  ?SpO2: 95% 95% 99% 99%  ?Weight:      ?Height:      ? ? ?Intake/Output Summary (Last 24 hours) at 12/12/2021 0830 ?Last data filed at 12/12/2021 0710 ?Gross per 24 hour  ?Intake 813.27 ml  ?Output --  ?Net 813.27 ml  ? ?Filed Weights  ? 12/09/21 0500 12/11/21 0500 12/12/21 0343  ?Weight: 83.2 kg 75.3 kg 76.4 kg  ? ? ?Examination: ? ?General exam: Appears calm and comfortable  ?Respiratory system: Clear to auscultation. Respiratory effort normal. ?Cardiovascular system: S1 & S2 +. No rubs, gallops or clicks.  ?Gastrointestinal system: Abdomen is nondistended, soft and nontender. Normal bowel sounds heard. ?Central nervous system: Alert and oriented. Moves all extremities  ?Psychiatry: Judgement and insight appear normal. Flat mood and affect.  ? ? ? ?Data Reviewed: I have personally reviewed following labs and imaging studies ? ?CBC: ?Recent Labs  ?Lab 12/07/21 ?1817 12/08/21 ?[id number] 12/10/21 ?[id number] 12/11/21 ?0326  ?WBC 13.4* 10.3 12.4* 8.5  ?NEUTROABS 9.3* 9.9*  --   --   ?HGB 15.6* 13.6 12.1 13.1  ?HCT 45.7 39.4 36.2 37.8  ?MCV 93.8 93.6 95.3 93.1  ?PLT 299 230 153 153  ? ?Basic Metabolic Panel: ?Recent Labs  ?Lab 12/08/21 ?[id number] 12/08/21 ?[id number] 12/08/21 ?1444 12/08/21 ?2010 12/09/21 ?[id number] 12/10/21 ?[id number] 12/11/21 ?0326 12/12/21 ?[id number]  ?NA 137  --  140  --  139 140 140 138  ?K 3.1*  --  3.9  --  3.9 3.9 3.6 3.9  ?CL 106  --  110  --  108 105 104 104  ?CO2 16*  --  22  --  24 27 26 27   ?GLUCOSE 252*  --  243*  --  200* 138* 151* 119*  ?BUN 8  --  6  --  7 14 12 20   ?CREATININE 0.92  --  0.62  --  0.49 0.84 0.55 0.56  ?CALCIUM 8.1*  --  8.4*  --  8.8* 9.5 9.1 8.7*  ?MG 1.2*   < >  --  2.0 1.9 1.8 2.2 2.2  ?PHOS 2.6  --  1.2*  --  2.5 3.1 4.1  --   ? < > = values in this interval not displayed.  ? ?GFR: ?Estimated Creatinine Clearance: 93.7 mL/min (by C-G formula based on SCr of 0.56  mg/dL). ?Liver Function Tests: ?Recent Labs  ?Lab 12/07/21 ?1817 12/08/21 ?[id number] 12/08/21 ?1444  ?AST 48* 41  --   ?ALT 22 23  --   ?ALKPHOS 62 52  --   ?BILITOT 1.5* 1.0  --   ?PROT 6.8 6.0*  --   ?ALBUMIN 3.5 3.3* 3.0*  ? ?No results for input(s): LIPASE, AMYLASE in the last 168 hours. ?No results for input(s): AMMONIA in the last 168 hours. ?Coagulation Profile: ?No results for input(s): INR, PROTIME in the last 168 hours. ?Cardiac Enzymes: ?No results for input(s): CKTOTAL, CKMB, CKMBINDEX, TROPONINI in the last 168 hours. ?BNP (last 3 results) ?No results for input(s): PROBNP in the last 8760 hours. ?HbA1C: ?No results for input(s): HGBA1C in the last 72 hours. ?CBG: ?Recent Labs  ?  Lab 12/11/21 ?0830 12/11/21 ?1139 12/11/21 ?1657 12/11/21 ?2105 12/12/21 ?0730  ?GLUCAP 138* 108* 99 110* 85  ? ?Lipid Profile: ?No results for input(s): CHOL, HDL, LDLCALC, TRIG, CHOLHDL, LDLDIRECT in the last 72 hours. ?Thyroid Function Tests: ?No results for input(s): TSH, T4TOTAL, FREET4, T3FREE, THYROIDAB in the last 72 hours. ?Anemia Panel: ?No results for input(s): VITAMINB12, FOLATE, FERRITIN, TIBC, IRON, RETICCTPCT in the last 72 hours. ?Sepsis Labs: ?Recent Labs  ?Lab 12/07/21 ?2121 12/08/21 ?0020 12/08/21 ?[id number] 12/08/21 ?[id number]  ?PROCALCITON  --   --   --  0.24  ?LATICACIDVEN 3.6* 5.5* 8.9* 5.1*  ? ? ?Recent Results (from the past 240 hour(s))  ?Resp Panel by RT-PCR (Flu A&B, Covid) Nasopharyngeal Swab     Status: None  ? Collection Time: 12/07/21  7:12 PM  ? Specimen: Nasopharyngeal Swab; Nasopharyngeal(NP) swabs in vial transport medium  ?Result Value Ref Range Status  ? SARS Coronavirus 2 by RT PCR NEGATIVE NEGATIVE Final  ?  Comment: (NOTE) ?SARS-CoV-2 target nucleic acids are NOT DETECTED. ? ?The SARS-CoV-2 RNA is generally detectable in upper respiratory ?specimens during the acute phase of infection. The lowest ?concentration of SARS-CoV-2 viral copies this assay can detect is ?138 copies/mL. A negative result does not  preclude SARS-Cov-2 ?infection and should not be used as the sole basis for treatment or ?other patient management decisions. A negative result may occur with  ?improper specimen collection/handling, submission of

## 2021-12-13 ENCOUNTER — Encounter: Payer: Self-pay | Admitting: Internal Medicine

## 2021-12-13 LAB — CBC
HCT: 43.6 % (ref 36.0–46.0)
Hemoglobin: 14.8 g/dL (ref 12.0–15.0)
MCH: 32 pg (ref 26.0–34.0)
MCHC: 33.9 g/dL (ref 30.0–36.0)
MCV: 94.4 fL (ref 80.0–100.0)
Platelets: 202 10*3/uL (ref 150–400)
RBC: 4.62 MIL/uL (ref 3.87–5.11)
RDW: 13.1 % (ref 11.5–15.5)
WBC: 11 10*3/uL — ABNORMAL HIGH (ref 4.0–10.5)
nRBC: 0 % (ref 0.0–0.2)

## 2021-12-13 LAB — GLUCOSE, CAPILLARY: Glucose-Capillary: 121 mg/dL — ABNORMAL HIGH (ref 70–99)

## 2021-12-13 LAB — BASIC METABOLIC PANEL
Anion gap: 6 (ref 5–15)
BUN: 17 mg/dL (ref 6–20)
CO2: 27 mmol/L (ref 22–32)
Calcium: 9 mg/dL (ref 8.9–10.3)
Chloride: 102 mmol/L (ref 98–111)
Creatinine, Ser: 0.62 mg/dL (ref 0.44–1.00)
GFR, Estimated: >60 mL/min (ref >60)
Glucose, Bld: 124 mg/dL — ABNORMAL HIGH (ref 70–99)
Potassium: 3.9 mmol/L (ref 3.5–5.1)
Sodium: 135 mmol/L (ref 135–145)

## 2021-12-13 NOTE — Discharge Summary (Signed)
Physician Discharge Summary   Patient: Phyllis Myers MRN: [id number] DOB: 1981-02-07  Admit date:     12/07/2021  Discharge date: 12/13/2021  Discharge Physician: Lurene Shadow   PCP: [name], MD   Recommendations at discharge:    Follow-up with PCP in 1 to 2 weeks  Discharge Diagnoses: Principal Problem:   Anaphylactic shock Active Problems:   Acute delirium  Resolved Problems:   * No resolved hospital problems. [facility] Course:  Ms. Phyllis Myers is a 41 year old woman with medical history significant for rheumatoid arthritis, depression, anxiety, ? alcohol use disorder, who presented to the hospital with an allergic reaction after she was given Cimzia IM injection for rheumatoid arthritis.  After about an hour after receiving the injection, she developed generalized body rash, wheezing, nausea and lightheadedness.  When EMS arrived, she was hypotensive with systolic blood pressure in the 60s.  She was given IV Benadryl and 2 doses of IM epinephrine and was brought to the emergency room for further management.  She was admitted to the ICU for anaphylactic shock.  She was treated with IV steroids, IV Benadryl, IV epinephrine drip and antiemetics.  Hospital course was complicated by acute delirium. She was treated with benzodiazepines and Precedex drip for suspected alcohol withdrawal.  Her condition slowly improved and she was transferred to the hospitalist service on 12/12/2021.  She is deemed stable for discharge to home.        Consultants: Intensivist, psychiatrist Procedures performed: None Disposition: Home Diet recommendation:  Discharge Diet Orders (From admission, onward)     Start     Ordered   12/13/21 0000  Diet - low sodium heart healthy        12/13/21 1041           Cardiac diet DISCHARGE MEDICATION: Allergies as of 12/13/2021       Reactions   Cimzia [certolizumab Pegol] Anaphylaxis   Amoxicillin    Muscle pain   Naproxen Sodium  Rash   Heavy doses        Medication List     STOP taking these medications    apixaban 2.5 MG Tabs tablet Commonly known as: Eliquis   celecoxib 100 MG capsule Commonly known as: CeleBREX   methocarbamol 500 MG tablet Commonly known as: ROBAXIN   oxyCODONE 5 MG immediate release tablet Commonly known as: Oxy IR/ROXICODONE   senna-docusate 8.6-50 MG tablet Commonly known as: Senokot-S   sertraline 25 MG tablet Commonly known as: ZOLOFT       TAKE these medications    diazepam 5 MG tablet Commonly known as: VALIUM TAKE 1 TABLET(5 MG) BY MOUTH EVERY 8 HOURS AS NEEDED FOR ANXIETY   hydroxychloroquine 200 MG tablet Commonly known as: PLAQUENIL Take 200 mg by mouth daily.   omeprazole 40 MG capsule Commonly known as: PRILOSEC TAKE 1 CAPSULE(40 MG) BY MOUTH DAILY What changed: See the new instructions.   predniSONE 5 MG tablet Commonly known as: DELTASONE Take 10 mg by mouth daily with breakfast.        Discharge Exam: Filed Weights   12/11/21 0500 12/12/21 0343 12/13/21 0455  Weight: 75.3 kg 76.4 kg 80.9 kg   GEN: NAD SKIN: No rash EYES: EOMI ENT: MMM CV: RRR PULM: CTA B ABD: soft, ND, NT, +BS CNS: AAO x 3, non focal EXT: No edema or tenderness   Condition at discharge: good  The results of significant diagnostics from this hospitalization (including imaging, microbiology, ancillary and laboratory) are listed  below for reference.   Imaging Studies: DG Abd 1 View  Result Date: 12/08/2021 CLINICAL DATA:  Pain over lumbar incision site. EXAM: ABDOMEN - 1 VIEW COMPARISON:  09/06/2021 FINDINGS: The bowel gas pattern is unremarkable. Findings for obstruction perforation. No worrisome calcifications. The soft tissue shadows are maintained. Lumbar fusion hardware appears to be in good position without obvious complicating features. There is a right-sided femoral venous catheter in place. An IUD is noted in the central pelvis. The bony structures  unremarkable. IMPRESSION: Unremarkable abdominal radiograph. Electronically Signed   By: Rudie Meyer M.D.   On: 12/08/2021 08:56   CT HEAD WO CONTRAST ( )  Result Date: 12/08/2021 CLINICAL DATA:  Headaches. EXAM: CT HEAD WITHOUT CONTRAST TECHNIQUE: Contiguous axial images were obtained from the base of the skull through the vertex without intravenous contrast. RADIATION DOSE REDUCTION: This exam was performed according to the departmental dose-optimization program which includes automated exposure control, adjustment of the mA and/or kV according to patient size and/or use of iterative reconstruction technique. COMPARISON:  June 27, 2016 FINDINGS: Brain: No evidence of acute infarction, hemorrhage, hydrocephalus, extra-axial collection or mass lesion/mass effect. Vascular: No hyperdense vessel or unexpected calcification. Skull: Normal. Negative for fracture or focal lesion. Sinuses/Orbits: No acute finding. Other: None. IMPRESSION: No acute intracranial abnormality. Electronically Signed   By: Aram Candela M.D.   On: 12/08/2021 00:53    Microbiology: Results for orders placed or performed during the hospital encounter of 12/07/21  Resp Panel by RT-PCR (Flu A&B, Covid) Nasopharyngeal Swab     Status: None   Collection Time: 12/07/21  7:12 PM   Specimen: Nasopharyngeal Swab; Nasopharyngeal(NP) swabs in vial transport medium  Result Value Ref Range Status   SARS Coronavirus 2 by RT PCR NEGATIVE NEGATIVE Final    Comment: (NOTE) SARS-CoV-2 target nucleic acids are NOT DETECTED.  The SARS-CoV-2 RNA is generally detectable in upper respiratory specimens during the acute phase of infection. The lowest concentration of SARS-CoV-2 viral copies this assay can detect is 138 copies/mL. A negative result does not preclude SARS-Cov-2 infection and should not be used as the sole basis for treatment or other patient management decisions. A negative result may occur with  improper specimen  collection/handling, submission of specimen other than nasopharyngeal swab, presence of viral mutation(s) within the areas targeted by this assay, and inadequate number of viral copies(<138 copies/mL). A negative result must be combined with clinical observations, patient history, and epidemiological information. The expected result is Negative.  Fact Sheet for Patients:  BloggerCourse.com  Fact Sheet for Healthcare Providers:  SeriousBroker.it  This test is no t yet approved or cleared by the [place] FDA and  has been authorized for detection and/or diagnosis of SARS-CoV-2 by FDA under an Emergency Use Authorization (EUA). This EUA will remain  in effect (meaning this test can be used) for the duration of the COVID-19 declaration under Section 564(b)(1) of the Act, 21 U.S.C.section 360bbb-3(b)(1), unless the authorization is terminated  or revoked sooner.       Influenza A by PCR NEGATIVE NEGATIVE Final   Influenza B by PCR NEGATIVE NEGATIVE Final    Comment: (NOTE) The Xpert Xpress SARS-CoV-2/FLU/RSV plus assay is intended as an aid in the diagnosis of influenza from Nasopharyngeal swab specimens and should not be used as a sole basis for treatment. Nasal washings and aspirates are unacceptable for Xpert Xpress SARS-CoV-2/FLU/RSV testing.  Fact Sheet for Patients: BloggerCourse.com  Fact Sheet for Healthcare Providers: SeriousBroker.it  This test is not  yet approved or cleared by the [place] and has been authorized for detection and/or diagnosis of SARS-CoV-2 by FDA under an Emergency Use Authorization (EUA). This EUA will remain in effect (meaning this test can be used) for the duration of the COVID-19 declaration under Section 564(b)(1) of the Act, 21 U.S.C. section 360bbb-3(b)(1), unless the authorization is terminated or revoked.  Performed at [facility], [address] [place], [place] [address]   MRSA Next Gen by PCR, Nasal     Status: None   Collection Time: 12/07/21  8:58 PM   Specimen: Nasal Mucosa; Nasal Swab  Result Value Ref Range Status   MRSA by PCR Next Gen NOT DETECTED NOT DETECTED Final    Comment: (NOTE) The GeneXpert MRSA Assay (FDA approved for NASAL specimens only), is one component of a comprehensive MRSA colonization surveillance program. It is not intended to diagnose MRSA infection nor to guide or monitor treatment for MRSA infections. Test performance is not FDA approved in patients less than 49 years old. Performed at [facility], [address] [place], [place] [address]     Labs: CBC: Recent Labs  Lab 12/08/21 [phone] 12/10/21 0528 12/11/21 0326 12/13/21 0604  WBC 10.3 12.4* 8.5 11.0*  NEUTROABS 9.9*  --   --   --   HGB 13.6 12.1 13.1 14.8  HCT 39.4 36.2 37.8 43.6  MCV 93.6 95.3 93.1 94.4  PLT 230 153 153 202   Basic Metabolic Panel: Recent Labs  Lab 12/08/21 0517 12/08/21 0624 12/08/21 1444 12/08/21 2010 12/09/21 0447 12/10/21 0528 12/11/21 0326 12/12/21 0341 12/13/21 0604  NA 137  --  140  --  139 140 140 138 135  K 3.1*  --  3.9  --  3.9 3.9 3.6 3.9 3.9  CL 106  --  110  --  108 105 104 104 102  CO2 16*  --  22  --  24 27 26 27 27   GLUCOSE 252*  --  243*  --  200* 138* 151* 119* 124*  BUN 8  --  6  --  7 14 12 20 17   CREATININE 0.92  --  0.62  --  0.49 0.84 0.55 0.56 0.62  CALCIUM 8.1*  --  8.4*  --  8.8* 9.5 9.1 8.7* 9.0  MG 1.2*   < >  --  2.0 1.9 1.8 2.2 2.2  --   PHOS 2.6  --  1.2*  --  2.5 3.1 4.1  --   --    < > = values in this interval not displayed.   Liver Function Tests: Recent Labs  Lab 12/08/21 0517 12/08/21 1444  AST 41  --   ALT 23  --   ALKPHOS 52  --   BILITOT 1.0  --   PROT 6.0*  --   ALBUMIN 3.3* 3.0*   CBG: Recent Labs  Lab 12/12/21 0730 12/12/21 1139 12/12/21 1712 12/12/21 2105 12/13/21 0802  GLUCAP 85 114* 150* 102*  121*    Discharge time spent: greater than 30 minutes.  Signed: Lurene Shadow, MD [facility] 12/14/2021

## 2021-12-13 NOTE — Progress Notes (Signed)
Discharge instructions reviewed with the patient. IV's removed. Being sent out via wheelchair with belongings ?

## 2021-12-13 NOTE — TOC Transition Note (Signed)
Transition of Care (TOC) - CM/SW Discharge Note ? ? ?Patient Details  ?Name: Phyllis Myers ?MRN: [id number] ?Date of Birth: 03-29-1981 ? ?Transition of Care (TOC) CM/SW Contact:  ?Beverly Sessions, RN ?Phone Number: ?12/13/2021, 1:21 PM ? ? ?Clinical Narrative:    ? ?Patient discharged home today ?Friend to transport ?PCP [place], ?Denies issues with obtaining medications ? ?Declines substance abuse resources  ? ?Isaias Cowman RNCM ?[phone] ? ? ?Final next level of care: Home/Self Care ?Barriers to Discharge: Barriers Resolved ? ? ?Patient Goals and CMS Choice ?  ?  ?  ? ?Discharge Placement ?  ?           ?  ?  ?  ?  ? ?Discharge Plan and Services ?  ?  ?           ?DME Arranged: N/A ?DME Agency: NA ?  ?  ?  ?HH Arranged: NA ?[place] Agency: NA ?  ?  ?  ? ?Social Determinants of Health (SDOH) Interventions ?  ? ? ?Readmission Risk Interventions ?No flowsheet data found. ? ? ? ? ?

## 2021-12-14 ENCOUNTER — Telehealth: Payer: Self-pay

## 2021-12-14 NOTE — Telephone Encounter (Addendum)
Transition Care Management Unsuccessful Follow-up Telephone Call ? ?Date of discharge and from where:  TCM DC [facility] 12-13-21 Dx: acute delirium ? ?Attempts:  1st Attempt ? ?Reason for unsuccessful TCM follow-up call:  Left voice message ? ?Transition Care Management Unsuccessful Follow-up Telephone Call ? ?Date of discharge and from where:  TCM DC [facility] 12-13-21 Dx: acute delirium ? ?Attempts:  2nd Attempt ? ?Reason for unsuccessful TCM follow-up call:  Unable to leave message ? ?  ? ?  ?

## 2021-12-14 NOTE — Telephone Encounter (Incomplete Revision)
Transition Care Management Unsuccessful Follow-up Telephone Call ? ?Date of discharge and from where:  TCM DC [facility] 12-13-21 Dx: acute delirium ? ?Attempts:  1st Attempt ? ?Reason for unsuccessful TCM follow-up call:  Left voice message ? ?Transition Care Management Unsuccessful Follow-up Telephone Call ? ?Date of discharge and from where:  TCM DC [facility] 12-13-21 Dx: acute delirium ? ?Attempts:  2nd Attempt ? ?Reason for unsuccessful TCM follow-up call:  Unable to leave message ? ?  ? ?  ?

## 2021-12-20 ENCOUNTER — Other Ambulatory Visit: Payer: Self-pay

## 2021-12-20 ENCOUNTER — Ambulatory Visit (INDEPENDENT_AMBULATORY_CARE_PROVIDER_SITE_OTHER): Payer: 59 | Admitting: Family Medicine

## 2021-12-20 ENCOUNTER — Encounter: Payer: Self-pay | Admitting: Family Medicine

## 2021-12-20 VITALS — BP 100/60 | HR 104 | Temp 98.3°F | Ht 65.0 in | Wt 180.2 lb

## 2021-12-20 DIAGNOSIS — T782XXA Anaphylactic shock, unspecified, initial encounter: Secondary | ICD-10-CM | POA: Diagnosis not present

## 2021-12-20 MED ORDER — DIAZEPAM 5 MG PO TABS: ORAL_TABLET | ORAL | 3 refills | Status: DC

## 2021-12-20 MED ORDER — EPINEPHRINE 0.3 MG/0.3ML IJ SOAJ: 0.3000 mg | INTRAMUSCULAR | 0 refills | Status: DC | PRN

## 2021-12-20 NOTE — Progress Notes (Signed)
? ? ?Dontrell Stuck T. Hena Ewalt, MD, CAQ Sports Medicine ?[facility] ?[address] [place] ?[place], [id number] ? ?Phone: [phone]  FAX: [phone] ? ?Phyllis Myers - 41 y.o. female  MRN [id number]  Date of Birth: 11/17/1980 ? ?Date: 12/20/2021  PCP: Phyllis Myers, Phyllis Adami, MD  Referral: Phyllis Myers, Phyllis Moll, MD ? ?Chief Complaint  ?Patient presents with  ? Hospitalization Follow-up  ? ? ?This visit occurred during the SARS-CoV-2 public health emergency.  Safety protocols were in place, including screening questions prior to the visit, additional usage of staff PPE, and extensive cleaning of exam room while observing appropriate contact time as indicated for disinfecting solutions.  ? ?Subjective:  ? ?Phyllis DimesJanet N Myers is a [age] y.o. very pleasant female patient with Body mass index is 29.98 kg/m?. who presents with the following: ? ?Admitted with anaphylactic shock, ICU, then acute delirium. ? ?Admit date:     12/07/2021  ?Discharge date: 12/13/2021  ? ? ?RA meds. ?Received Cimzia for RA, then an hour later, got whole body rash, wheezing, and was hypotensive to the 60's systolic in the ER. ? ?She was then admitted to the ICU and received IV steroids, Benadryl, as well as epinephrine. ?ICU - IC steroids, benadryl, ephinephrine. ? ?She later was found to be acutely delirious, and the hospital was concerned that she was having some withdrawal from alcohol.  We talked about that at length today, and it seems like she only drinks 2 glasses of wine sometimes at night. ?Acute delirium, as well? ?Benzos - put on some Ativan and Precedex for presumed ETOH withdrawal. ?- normal 2 wine glasses ? ?Now on Plaquenil for RA - off today. ?Pred 10 mg per Dr. Koren Myers ? ?Will need Rheum f/u, too. ?Dr. Koren Myers came to see her in the hospital, and she has a follow-up with him tomorrow. ? ?Valium 3 times a week -this is often on an as as-needed basis. ? ? ? ?Lab Results  ?Component Value Date  ? HGBA1C 5.2 12/08/2021  ?   ? ?Review of Systems is noted in the HPI, as appropriate ? ?Objective:  ? ?BP 100/60   Pulse (!) 104   Temp 98.3 ?F (36.8 ?C) (Oral)   Ht 5\' 5"  (1.651 m)   Wt 180 lb 3 oz (81.7 kg)   SpO2 100%   BMI 29.98 kg/m?  ? ?GEN: No acute distress; alert,appropriate. ?PULM: Breathing comfortably in no respiratory distress ?PSYCH: Normally interactive.  ?CV: RRR, no m/g/r  ?PULM: Normal respiratory rate, no accessory muscle use. No wheezes, crackles or rhonchi  ? ?Laboratory and Imaging Data: ? ?Assessment and Plan:  ? ?  ICD-10-CM   ?1. Anaphylactic shock  T78.2XXA   ?  ? ?Inpatient records have been reviewed. ? ?I talked about the case with the patient, what happened to her before hospitalization and afterwards.  Thankfully she is doing okay after a severe anaphylactic reaction. ? ?I did give her an EpiPen to keep in her purse right now. ? ?There was a question of some alcohol withdrawal/delirium.  We talked about this frankly, and it sounds like she only has at most 2 glasses of wine of a normal size daily.  This does not sound like she has an alcohol use disorder. ? ?Meds ordered this encounter  ?Medications  ? diazepam (VALIUM) 5 MG tablet  ?  Sig: TAKE 1 TABLET(5 MG) BY MOUTH EVERY 8 HOURS AS NEEDED FOR ANXIETY  ?  Dispense:  30 tablet  ?  Refill:  3  ? EPINEPHrine 0.3 mg/0.3 mL IJ SOAJ injection  ?  Sig: Inject 0.3 mg into the muscle as needed for anaphylaxis.  ?  Dispense:  1 each  ?  Refill:  0  ? ?Medications Discontinued During This Encounter  ?Medication Reason  ? diazepam (VALIUM) 5 MG tablet Reorder  ? ?No orders of the defined types were placed in this encounter. ? ? ?Follow-up: No follow-ups on file. ? ?Dragon Medical One speech-to-text software was used for transcription in this dictation.  Possible transcriptional errors can occur using Animal nutritionist.  ? ?Signed, ? ?Phyllis Myers T. Ceejay Kegley, MD ? ? ?Outpatient Encounter Medications as of 12/20/2021  ?Medication Sig  ? EPINEPHrine 0.3 mg/0.3 mL IJ SOAJ  injection Inject 0.3 mg into the muscle as needed for anaphylaxis.  ? omeprazole (PRILOSEC) 40 MG capsule TAKE 1 CAPSULE(40 MG) BY MOUTH DAILY  ? predniSONE (DELTASONE) 5 MG tablet Take 10 mg by mouth daily with breakfast.  ? [DISCONTINUED] diazepam (VALIUM) 5 MG tablet TAKE 1 TABLET(5 MG) BY MOUTH EVERY 8 HOURS AS NEEDED FOR ANXIETY  ? diazepam (VALIUM) 5 MG tablet TAKE 1 TABLET(5 MG) BY MOUTH EVERY 8 HOURS AS NEEDED FOR ANXIETY  ? hydroxychloroquine (PLAQUENIL) 200 MG tablet Take 200 mg by mouth daily. (Patient not taking: Reported on 12/20/2021)  ? ?No facility-administered encounter medications on file as of 12/20/2021.  ?  ?

## 2022-01-18 ENCOUNTER — Telehealth: Payer: Self-pay

## 2022-01-18 MED ORDER — OMEPRAZOLE 40 MG PO CPDR
DELAYED_RELEASE_CAPSULE | ORAL | 3 refills | Status: DC
Start: 2022-01-18 — End: 2023-05-05

## 2022-01-18 NOTE — Telephone Encounter (Signed)
Refills sent as instructed by Dr. Patsy Lager.  Jaretta notified  refills have been sent to her pharmacy as requested.  ?

## 2022-01-18 NOTE — Telephone Encounter (Signed)
Pt asking for refill omeprazole [facility] [place]. She was seen last month for problem visit. Omeprazole said to see provider for next refills. Please let her know if this is not possible. ?

## 2022-04-16 LAB — HM PAP SMEAR: HPV, high-risk: NEGATIVE

## 2022-05-07 ENCOUNTER — Other Ambulatory Visit: Payer: Self-pay | Admitting: Obstetrics and Gynecology

## 2022-05-07 DIAGNOSIS — R928 Other abnormal and inconclusive findings on diagnostic imaging of breast: Secondary | ICD-10-CM

## 2022-05-15 ENCOUNTER — Other Ambulatory Visit: Payer: Self-pay | Admitting: Obstetrics and Gynecology

## 2022-05-15 DIAGNOSIS — Z8249 Family history of ischemic heart disease and other diseases of the circulatory system: Secondary | ICD-10-CM

## 2022-06-25 ENCOUNTER — Ambulatory Visit (INDEPENDENT_AMBULATORY_CARE_PROVIDER_SITE_OTHER): Payer: 59 | Admitting: Dermatology

## 2022-06-25 DIAGNOSIS — I781 Nevus, non-neoplastic: Secondary | ICD-10-CM | POA: Diagnosis not present

## 2022-06-25 DIAGNOSIS — L578 Other skin changes due to chronic exposure to nonionizing radiation: Secondary | ICD-10-CM

## 2022-06-25 NOTE — Progress Notes (Unsigned)
   New Patient Visit  Subjective  Phyllis Myers is a 41 y.o. female who presents for the following: Skin Problem (New patient here today to have a spot at lip checked. Patient advises around her lip line she has started to break out and there was a spot at right upper lip that she popped. It has been there for about 6 months. ).  Family history of skin cancer - what type(s): father - who affected: patient unsure of type but it was a rare skin cancer that he died from last year  The following portions of the chart were reviewed this encounter and updated as appropriate:   Tobacco  Allergies  Meds  Problems  Med Hx  Surg Hx  Fam Hx     Review of Systems:  No other skin or systemic complaints except as noted in HPI or Assessment and Plan.  Objective  Well appearing patient in no apparent distress; mood and affect are within normal limits.  A focused examination was performed including face. Relevant physical exam findings are noted in the Assessment and Plan.  right upper lip Dilated blood vessel 3.0 mm      Assessment & Plan  Telangiectasia right upper lip  Benign-appearing.  Observation.  Call clinic for new or changing lesions.  Recommend daily use of broad spectrum spf 30+ sunscreen to sun-exposed areas.   Blanches by diascopy.  Discussed treatment with laser, $200 per treatment.  Actinic Damage - chronic, secondary to cumulative UV radiation exposure/sun exposure over time - diffuse scaly erythematous macules with underlying dyspigmentation - Recommend daily broad spectrum sunscreen SPF 30+ to sun-exposed areas, reapply every 2 hours as needed.  - Recommend staying in the shade or wearing long sleeves, sun glasses (UVA+UVB protection) and wide brim hats (4-inch brim around the entire circumference of the hat). - Call for new or changing lesions.  Return if symptoms worsen or fail to improve.  Graciella Belton, RMA, am acting as scribe for Sarina Ser, MD  . Documentation: I have reviewed the above documentation for accuracy and completeness, and I agree with the above.  Sarina Ser, MD

## 2022-06-25 NOTE — Patient Instructions (Signed)
Due to recent changes in healthcare laws, you may see results of your pathology and/or laboratory studies on MyChart before the doctors have had a chance to review them. We understand that in some cases there may be results that are confusing or concerning to you. Please understand that not all results are received at the same time and often the doctors may need to interpret multiple results in order to provide you with the best plan of care or course of treatment. Therefore, we ask that you please give us 2 business days to thoroughly review all your results before contacting the office for clarification. Should we see a critical lab result, you will be contacted sooner.   If You Need Anything After Your Visit  If you have any questions or concerns for your doctor, please call our main line at [phone] and press option 4 to reach your doctor's medical assistant. If no one answers, please leave a voicemail as directed and we will return your call as soon as possible. Messages left after 4 pm will be answered the following business day.   You may also send us a message via MyChart. We typically respond to MyChart messages within 1-2 business days.  For prescription refills, please ask your pharmacy to contact our office. Our fax number is [phone].  If you have an urgent issue when the clinic is closed that cannot wait until the next business day, you can page your doctor at the number below.    Please note that while we do our best to be available for urgent issues outside of office hours, we are not available 24/7.   If you have an urgent issue and are unable to reach us, you may choose to seek medical care at your doctor's office, retail clinic, urgent care center, or emergency room.  If you have a medical emergency, please immediately call 911 or go to the emergency department.  Pager Numbers  - Dr. Kowalski: [phone]  - Dr. Moye: [phone]  - Dr. Stewart:  [phone]  In the event of inclement weather, please call our main line at [phone] for an update on the status of any delays or closures.  Dermatology Medication Tips: Please keep the boxes that topical medications come in in order to help keep track of the instructions about where and how to use these. Pharmacies typically print the medication instructions only on the boxes and not directly on the medication tubes.   If your medication is too expensive, please contact our office at [phone] option 4 or send us a message through MyChart.   We are unable to tell what your co-pay for medications will be in advance as this is different depending on your insurance coverage. However, we may be able to find a substitute medication at lower cost or fill out paperwork to get insurance to cover a needed medication.   If a prior authorization is required to get your medication covered by your insurance company, please allow us 1-2 business days to complete this process.  Drug prices often vary depending on where the prescription is filled and some pharmacies may offer cheaper prices.  The website www.goodrx.com contains coupons for medications through different pharmacies. The prices here do not account for what the cost may be with help from insurance (it may be cheaper with your insurance), but the website can give you the price if you did not use any insurance.  - You can print the associated coupon and take it with   your prescription to the pharmacy.  - You may also stop by our office during regular business hours and pick up a GoodRx coupon card.  - If you need your prescription sent electronically to a different pharmacy, notify our office through [place] MyChart or by phone at [phone] option 4.     Si Usted Necesita Algo Despus de Su Visita  Tambin puede enviarnos un mensaje a travs de MyChart. Por lo general respondemos a los mensajes de MyChart en el transcurso de 1 a 2  das hbiles.  Para renovar recetas, por favor pida a su farmacia que se ponga en contacto con nuestra oficina. Nuestro nmero de fax es el [phone].  Si tiene un asunto urgente cuando la clnica est cerrada y que no puede esperar hasta el siguiente da hbil, puede llamar/localizar a su doctor(a) al nmero que aparece a continuacin.   Por favor, tenga en cuenta que aunque hacemos todo lo posible para estar disponibles para asuntos urgentes fuera del horario de oficina, no estamos disponibles las 24 horas del da, los 7 das de la semana.   Si tiene un problema urgente y no puede comunicarse con nosotros, puede optar por buscar atencin mdica  en el consultorio de su doctor(a), en una clnica privada, en un centro de atencin urgente o en una sala de emergencias.  Si tiene una emergencia mdica, por favor llame inmediatamente al 911 o vaya a la sala de emergencias.  Nmeros de bper  - Dr. Kowalski: [phone]  - Dra. Moye: [phone]  - Dra. Stewart: [phone]  En caso de inclemencias del tiempo, por favor llame a nuestra lnea principal al [phone] para una actualizacin sobre el estado de cualquier retraso o cierre.  Consejos para la medicacin en dermatologa: Por favor, guarde las cajas en las que vienen los medicamentos de uso tpico para ayudarle a seguir las instrucciones sobre dnde y cmo usarlos. Las farmacias generalmente imprimen las instrucciones del medicamento slo en las cajas y no directamente en los tubos del medicamento.   Si su medicamento es muy caro, por favor, pngase en contacto con nuestra oficina llamando al [phone] y presione la opcin 4 o envenos un mensaje a travs de MyChart.   No podemos decirle cul ser su copago por los medicamentos por adelantado ya que esto es diferente dependiendo de la cobertura de su seguro. Sin embargo, es posible que podamos encontrar un medicamento sustituto a menor costo o llenar un formulario para que el  seguro cubra el medicamento que se considera necesario.   Si se requiere una autorizacin previa para que su compaa de seguros cubra su medicamento, por favor permtanos de 1 a 2 das hbiles para completar este proceso.  Los precios de los medicamentos varan con frecuencia dependiendo del lugar de dnde se surte la receta y alguna farmacias pueden ofrecer precios ms baratos.  El sitio web www.goodrx.com tiene cupones para medicamentos de diferentes farmacias. Los precios aqu no tienen en cuenta lo que podra costar con la ayuda del seguro (puede ser ms barato con su seguro), pero el sitio web puede darle el precio si no utiliz ningn seguro.  - Puede imprimir el cupn correspondiente y llevarlo con su receta a la farmacia.  - Tambin puede pasar por nuestra oficina durante el horario de atencin regular y recoger una tarjeta de cupones de GoodRx.  - Si necesita que su receta se enve electrnicamente a una farmacia diferente, informe a nuestra oficina a travs de MyChart de [facility]   o por telfono llamando al [phone] y presione la opcin 4.  

## 2022-06-26 ENCOUNTER — Encounter: Payer: Self-pay | Admitting: Dermatology

## 2022-07-04 ENCOUNTER — Ambulatory Visit
Admission: RE | Admit: 2022-07-04 | Discharge: 2022-07-04 | Disposition: A | Discharge status: Home / Self Care | Payer: 59 | Source: Ambulatory Visit | Attending: Obstetrics and Gynecology | Admitting: Obstetrics and Gynecology

## 2022-07-04 DIAGNOSIS — R928 Other abnormal and inconclusive findings on diagnostic imaging of breast: Secondary | ICD-10-CM

## 2022-07-24 ENCOUNTER — Other Ambulatory Visit: Payer: Self-pay | Admitting: Family Medicine

## 2022-07-24 NOTE — Telephone Encounter (Signed)
Last office visit 12/20/21 for hosptial follow up.  Last refilled 12/20/21 for #30 with 3 refills.  No future appointments.

## 2022-07-30 ENCOUNTER — Encounter (INDEPENDENT_AMBULATORY_CARE_PROVIDER_SITE_OTHER): Payer: Self-pay

## 2022-08-29 ENCOUNTER — Encounter: Payer: Self-pay | Admitting: Nurse Practitioner

## 2022-08-29 ENCOUNTER — Telehealth: Payer: Self-pay | Admitting: Nurse Practitioner

## 2022-08-29 ENCOUNTER — Ambulatory Visit (INDEPENDENT_AMBULATORY_CARE_PROVIDER_SITE_OTHER): Payer: 59 | Admitting: Nurse Practitioner

## 2022-08-29 ENCOUNTER — Telehealth: Payer: 59 | Admitting: Nurse Practitioner

## 2022-08-29 VITALS — BP 126/82 | HR 105 | Temp 99.6°F | Resp 14 | Ht 65.0 in | Wt 189.0 lb

## 2022-08-29 DIAGNOSIS — R051 Acute cough: Secondary | ICD-10-CM

## 2022-08-29 DIAGNOSIS — J029 Acute pharyngitis, unspecified: Secondary | ICD-10-CM | POA: Insufficient documentation

## 2022-08-29 DIAGNOSIS — U071 COVID-19: Secondary | ICD-10-CM | POA: Diagnosis not present

## 2022-08-29 DIAGNOSIS — R112 Nausea with vomiting, unspecified: Secondary | ICD-10-CM

## 2022-08-29 LAB — POCT RAPID STREP A (OFFICE): Rapid Strep A Screen: NEGATIVE

## 2022-08-29 LAB — POCT INFLUENZA A/B: Influenza A, POC: NEGATIVE

## 2022-08-29 LAB — POC COVID19 BINAXNOW: SARS Coronavirus 2 Ag: POSITIVE — AB

## 2022-08-29 MED ORDER — GUAIFENESIN-CODEINE 100-10 MG/5ML PO SOLN: 5.0000 mL | Freq: Three times a day (TID) | ORAL | 0 refills | Status: AC | PRN

## 2022-08-29 MED ORDER — NIRMATRELVIR/RITONAVIR (PAXLOVID)TABLET: 3.0000 | ORAL_TABLET | Freq: Two times a day (BID) | ORAL | 0 refills | Status: AC

## 2022-08-29 NOTE — Assessment & Plan Note (Signed)
Wrote codeine-guaifenesin cough medication.  Sedation precautions reviewed.

## 2022-08-29 NOTE — Telephone Encounter (Signed)
Dr. Allena Katz  I saw Phyllis Myers in office today and she tested positive for covid 19. I recommended paxlovid. She wanted me to reach out and see if you agree or have different recommendations   Thanks, Susy Frizzle

## 2022-08-29 NOTE — Assessment & Plan Note (Signed)
COVID-19 positive in office.  Patient is immunocompromise did recommend Paxlovid.  We discussed both antivirals and later EUA only.  Did discuss quarantine in regards to the CDC guidelines and recommendations.  Patient to follow-up if no improvement.  Signs and symptoms reviewed when to seek urgent or emergent healthcare.

## 2022-08-29 NOTE — Assessment & Plan Note (Signed)
Strep test, flu test negative in office.  COVID test positive.  Patient can use over-the-counter analgesics as needed along with fluids and warm salt water gargles for throat discomfort

## 2022-08-29 NOTE — Patient Instructions (Signed)
Nice to see you today Call Dr. Eliane Decree office and let them know that you tested positive for covid Follow up if you do not start improving

## 2022-08-29 NOTE — Assessment & Plan Note (Signed)
Symptoms resolved per patient report.  Continue drinking plenty of fluid

## 2022-08-29 NOTE — Progress Notes (Signed)
Acute Office Visit  Subjective:     Patient ID: KEYSHLA TUNISON, female    DOB: 23-Jan-1981, 41 y.o.   MRN: [id number]  Chief Complaint  Patient presents with   Cough    Sx started on 08/26/22- Earache, sore throat, headache, hoarse, vomiting, malaise/ache, sweats.     Patient is in today for sick symptoms  States that Sunday she was coming back home and had a kind sick feeling. Monday she started with symptoms States that no sick contacts No covid test Covid vaccine UTD Flu vaccine not up to date  States that she has taken tylenol pm that helped some with the headahce Took some nyquill that was not that effective   Review of Systems  Constitutional:  Positive for chills, fever and malaise/fatigue.       Appetite decreased Fluid intake she is trying. Typically drinks lots of water  HENT:  Positive for ear pain, sinus pain and sore throat. Negative for ear discharge.   Respiratory:  Positive for cough, sputum production and shortness of breath (at rest).   Gastrointestinal:  Positive for abdominal pain, nausea and vomiting.  Musculoskeletal:  Negative for joint pain and myalgias.  Neurological:  Positive for headaches.        Objective:    BP 126/82   Pulse (!) 105   Temp 99.6 F (37.6 C) (Oral)   Resp 14   Ht 5\' 5"  (1.651 m)   Wt 189 lb (85.7 kg)   SpO2 98%   BMI 31.45 kg/m    Physical Exam Vitals and nursing note reviewed.  Constitutional:      Appearance: Normal appearance.  HENT:     Right Ear: Ear canal and external ear normal.     Left Ear: Tympanic membrane, ear canal and external ear normal.     Ears:     Comments: Fluid behind right TM    Nose:     Right Sinus: No maxillary sinus tenderness or frontal sinus tenderness.     Left Sinus: No maxillary sinus tenderness or frontal sinus tenderness.     Mouth/Throat:     Mouth: Mucous membranes are moist.     Pharynx: Oropharynx is clear. Posterior oropharyngeal erythema present.  Cardiovascular:      Rate and Rhythm: Normal rate and regular rhythm.     Heart sounds: Normal heart sounds.  Pulmonary:     Effort: Pulmonary effort is normal.     Breath sounds: Normal breath sounds.  Abdominal:     General: Bowel sounds are normal. There is no distension.     Palpations: There is no mass.     Tenderness: There is abdominal tenderness.     Hernia: No hernia is present.  Lymphadenopathy:     Cervical: Cervical adenopathy present.  Neurological:     Mental Status: She is alert.     Results for orders placed or performed in visit on 08/29/22  Rapid Strep A  Result Value Ref Range   Rapid Strep A Screen Negative Negative  POC COVID-19  Result Value Ref Range   SARS Coronavirus 2 Ag Positive (A) Negative        Assessment & Plan:   Problem List Items Addressed This Visit       Digestive   Nausea and vomiting    Symptoms resolved per patient report.  Continue drinking plenty of fluid      Relevant Orders   POC COVID-19 (Completed)   POCT Influenza A/B  Other   COVID-19    COVID-19 positive in office.  Patient is immunocompromise did recommend Paxlovid.  We discussed both antivirals and later EUA only.  Did discuss quarantine in regards to the CDC guidelines and recommendations.  Patient to follow-up if no improvement.  Signs and symptoms reviewed when to seek urgent or emergent healthcare.      Relevant Medications   nirmatrelvir/ritonavir EUA (PAXLOVID) 20 x 150 MG & 10 x 100MG  TABS   Acute cough    Wrote codeine-guaifenesin cough medication.  Sedation precautions reviewed.      Relevant Medications   guaiFENesin-codeine 100-10 MG/5ML syrup   Other Relevant Orders   POC COVID-19 (Completed)   POCT Influenza A/B   Sore throat - Primary    Strep test, flu test negative in office.  COVID test positive.  Patient can use over-the-counter analgesics as needed along with fluids and warm salt water gargles for throat discomfort      Relevant Orders   Rapid  Strep A (Completed)   POC COVID-19 (Completed)   POCT Influenza A/B    Meds ordered this encounter  Medications   nirmatrelvir/ritonavir EUA (PAXLOVID) 20 x 150 MG & 10 x 100MG  TABS    Sig: Take 3 tablets by mouth 2 (two) times daily for 5 days. (Take nirmatrelvir 150 mg two tablets twice daily for 5 days and ritonavir 100 mg one tablet twice daily for 5 days) Patient GFR is 92    Dispense:  30 tablet    Refill:  0    Order Specific Question:   Supervising Provider    Answer:   , MARNE A [[id number]]   guaiFENesin-codeine 100-10 MG/5ML syrup    Sig: Take 5 mLs by mouth 3 (three) times daily as needed for up to 8 days for cough.    Dispense:  120 mL    Refill:  0    Order Specific Question:   Supervising Provider    Answer:   A [[id number]]    Return if symptoms worsen or fail to improve.  Milinda Antis, NP

## 2022-09-03 ENCOUNTER — Other Ambulatory Visit: Payer: Self-pay

## 2022-09-03 DIAGNOSIS — Z981 Arthrodesis status: Secondary | ICD-10-CM

## 2022-09-04 ENCOUNTER — Ambulatory Visit
Admission: RE | Admit: 2022-09-04 | Discharge: 2022-09-04 | Disposition: A | Discharge status: Home / Self Care | Payer: 59 | Source: Ambulatory Visit | Attending: Neurosurgery | Admitting: Neurosurgery

## 2022-09-04 ENCOUNTER — Encounter: Payer: Self-pay | Admitting: Neurosurgery

## 2022-09-04 ENCOUNTER — Ambulatory Visit (INDEPENDENT_AMBULATORY_CARE_PROVIDER_SITE_OTHER): Payer: 59 | Admitting: Neurosurgery

## 2022-09-04 VITALS — BP 126/84 | Ht 65.0 in | Wt 193.0 lb

## 2022-09-04 DIAGNOSIS — Z981 Arthrodesis status: Secondary | ICD-10-CM | POA: Insufficient documentation

## 2022-09-04 DIAGNOSIS — G8929 Other chronic pain: Secondary | ICD-10-CM | POA: Diagnosis not present

## 2022-09-04 DIAGNOSIS — M5416 Radiculopathy, lumbar region: Secondary | ICD-10-CM | POA: Diagnosis not present

## 2022-09-04 DIAGNOSIS — M4310 Spondylolisthesis, site unspecified: Secondary | ICD-10-CM | POA: Diagnosis not present

## 2022-09-04 DIAGNOSIS — Z09 Encounter for follow-up examination after completed treatment for conditions other than malignant neoplasm: Secondary | ICD-10-CM

## 2022-09-04 NOTE — Progress Notes (Signed)
   DOS: 09/06/2021 (L5/S1 ALIF/PSF)  HISTORY OF PRESENT ILLNESS: 09/04/2022 Ms. Phyllis Myers is status post anterior lumbar interbody fusion.  She is doing very well.  She has intermittent mild pain down her right leg, but her symptoms are much improved compared to preop.  Unfortunately, she has had trouble getting her arthritis medicine approved this year.  She has been on prednisone for the past 9 months..   PHYSICAL EXAMINATION:   Vitals:   09/04/22 1522  BP: 126/84   General: Patient is well developed, well nourished, calm, collected, and in no apparent distress.  NEUROLOGICAL:  General: In no acute distress.  Awake, alert, oriented to person, place, and time. Pupils equal round and reactive to light.   Strength:  Side Iliopsoas Quads Hamstring PF DF EHL  R 5 5 5 5 5 5   L 5 5 5 5 5 5    Incision c/d/i   ROS (Neurologic): Negative except as noted above  IMAGING: No complications noted   ASSESSMENT/PLAN:  Phyllis Myers is doing well after lumbar spinal fusion.  I am very pleased with her symptomatic response to surgery.  Unfortunate, she has been on prednisone for 9 months.  This has increased her chance of getting a pseudoarthrosis at some point.  I cannot determine whether she has a pseudoarthrosis at this point.  I think she should be strongly considered for an immune modulator to help with her arthritis.  I spent a total of 10 minutes in this patient's care today. This time was spent reviewing pertinent records including imaging studies, obtaining and confirming history, performing a directed evaluation, formulating and discussing my recommendations, and documenting the visit within the medical record.    MD, [facility] Department of Neurosurgery

## 2022-09-05 ENCOUNTER — Telehealth: Payer: Self-pay | Admitting: Family Medicine

## 2022-09-05 NOTE — Telephone Encounter (Signed)
LMTCB

## 2022-09-05 NOTE — Telephone Encounter (Signed)
Patient called back and was advised on below.

## 2022-09-05 NOTE — Telephone Encounter (Signed)
Patient came in last week and seen [facility]. She stated she is feeling a lot better but she is still experiencing some congestion, cough, and sore throat. She stated that she has taking all the medication he prescribed for cough but was wondering if something else could be sent in for her. Please advise. Thank you!

## 2022-09-05 NOTE — Telephone Encounter (Signed)
Drink plenty of fluid and having a hard candy or cough drop can help with the sore throat. If she feels like she is having drainage down the back of her throat she can get over the counter flonase nasal spray and either Claritin or zyrtec antihistamine.  Warm salt water gargles can also provide relief in regards to her sore throat

## 2023-02-07 ENCOUNTER — Other Ambulatory Visit: Payer: Self-pay | Admitting: Family Medicine

## 2023-02-07 NOTE — Telephone Encounter (Signed)
Patient scheduled.

## 2023-02-07 NOTE — Telephone Encounter (Signed)
Last office visit 08/29/2022 with Phyllis Myers for sore throat.  Last refilled 07/24/22 for #30 with 3 refills.  Next appt: No future appointments.   Please call and schedule CPE with fasting labs prior with Dr. Patsy Lager.

## 2023-02-08 ENCOUNTER — Encounter: Payer: Self-pay | Admitting: Oncology

## 2023-02-19 NOTE — Progress Notes (Signed)
Phyllis Watt T. Phyllis Mcelwee, MD, CAQ Sports Medicine [facility] at [facility] [address] [place], [address]  Phone: [phone]  FAX: [phone]  CHEMIKA VESELY - 42 y.o. female  MRN [id number]  Date of Birth: 03/15/1981  Date: 02/20/2023  PCP: Phyllis Beat, MD  Referral: Phyllis Beat, MD  No chief complaint on file.  Patient Care Team: Phyllis Beat, MD as PCP - General (Family Medicine) Phyllis Ruths, MD as Consulting Physician (Oncology) Subjective:   Phyllis Myers is a 42 y.o. pleasant patient who presents with the following:  Health Maintenance Summary Reviewed and updated, unless pt declines services.  Tobacco History Reviewed. Non-smoker Alcohol: No concerns, no excessive use Exercise Habits: Some activity, rec at least 30 mins 5 times a week STD concerns: none Drug Use: None Lumps or breast concerns: no  Health Maintenance  Topic Date Due   Hepatitis C Screening  Never done   DTaP/Tdap/Td (1 - Tdap) Never done   PAP SMEAR-Modifier  04/24/2020   COVID-19 Vaccine (3 - 2023-24 season) 06/01/2022   INFLUENZA VACCINE  05/02/2023   HIV Screening  Completed   HPV VACCINES  Aged Out   Hep C Tdap Pap -check, when she has had one, it looks to be 2018  Immunization History  Administered Date(s) Administered   PFIZER(Purple Top)SARS-COV-2 Vaccination 01/07/2020, 02/02/2020   Patient Active Problem List   Diagnosis Date Noted   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    Priority: High   Anaphylactic shock 12/07/2021    Priority: Medium    Panic disorder 07/21/2014    Priority: Medium    Generalized anxiety disorder 09/08/2013    Priority: Medium    Common migraine 01/27/2013    Priority: Medium    COVID-19 08/29/2022   Nausea and vomiting 08/29/2022   Acute cough 08/29/2022   Sore throat 08/29/2022   Acute delirium 12/11/2021   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   Depression, major, single episode, mild (HCC)  12/20/2016   GERD (gastroesophageal reflux disease) 12/02/2015   Allergic rhinitis 07/21/2014   Family history of early CAD 06/09/2013   HTN (hypertension) 01/27/2013   Hx of abnormal Pap smear 01/27/2013    Past Medical History:  Diagnosis Date   Depression, major, single episode, mild (HCC) 12/20/2016   Generalized anxiety disorder 09/08/2013   GERD (gastroesophageal reflux disease)    Hemochromatosis    Hypertension    not on medications at this time since around 2015   Nausea and vomiting 08/29/2022   Panic disorder 07/21/2014   Pneumonia    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 [name]). Now off enbrel, methotrexate, uses indomethacin and hydrocodone rarely for pain.    Rheumatoid arthritis (HCC)     Past Surgical History:  Procedure Laterality Date   ABDOMINAL EXPOSURE N/A 09/06/2021   Procedure: ABDOMINAL EXPOSURE;  Surgeon: Annice Needy, MD;  Location: [facility] ORS;  Service: Vascular;  Laterality: N/A;   ANTERIOR LATERAL LUMBAR FUSION WITH PERCUTANEOUS SCREW 1 LEVEL N/A 09/06/2021   Procedure: L5-S1 ANTERIOR LATERAL LUMBAR FUSION WITH PERCUTANEOUS SCREW;  Surgeon: [name], MD;  Location: [facility] ORS;  Service: Neurosurgery;  Laterality: N/A;   PERONEAL NERVE DECOMPRESSION Left 12/14/2019   Procedure: PERONEAL NERVE DECOMPRESSION;  Surgeon: [name], MD;  Location: [facility] ORS;  Service: Neurosurgery;  Laterality: Left;   WISDOM TOOTH EXTRACTION      Family History  Problem Relation Age of Onset   Pulmonary Hypertension Mother  Cardiomyopathy Mother    Heart disease Mother    Heart disease Father 41       MI   Heart disease Maternal Grandfather    Heart disease Paternal Grandfather     Social History   Social History Narrative   no kids.    Past Medical History, Surgical History, Social History, Family History, Problem List, Medications, and Allergies have been reviewed and updated if relevant.  Review of Systems:  Pertinent positives are listed above.  Otherwise, a full 14 point review of systems has been done in full and it is negative except where it is noted positive.  Objective:   There were no vitals taken for this visit. Ideal Body Weight:   No results found.    02/17/2018    2:27 PM  Depression screen PHQ 2/9  Decreased Interest 0  Down, Depressed, Hopeless 0  PHQ - 2 Score 0     GEN: well developed, well nourished, no acute distress Eyes: conjunctiva and lids normal, PERRLA, EOMI ENT: TM clear, nares clear, oral exam WNL Neck: supple, no lymphadenopathy, no thyromegaly, no JVD Pulm: clear to auscultation and percussion, respiratory effort normal CV: regular rate and rhythm, S1-S2, no murmur, rub or gallop, no bruits Chest: no scars, masses, no lumps BREAST: breast exam declined GI: soft, non-tender; no hepatosplenomegaly, masses; active bowel sounds all quadrants GU: GU exam declined Lymph: no cervical, axillary or inguinal adenopathy MSK: gait normal, muscle tone and strength WNL, no joint swelling, effusions, discoloration, crepitus  SKIN: clear, good turgor, color WNL, no rashes, lesions, or ulcerations Neuro: normal mental status, normal strength, sensation, and motion Psych: alert; oriented to person, place and time, normally interactive and not anxious or depressed in appearance.   All labs reviewed with patient. Results for orders placed or performed in visit on 08/29/22  Rapid Strep A  Result Value Ref Range   Rapid Strep A Screen Negative Negative  POC COVID-19  Result Value Ref Range   SARS Coronavirus 2 Ag Positive (A) Negative  POCT Influenza A/B  Result Value Ref Range   Influenza A, POC Negative Negative   Influenza B, POC     No results found.  Assessment and Plan:     ICD-10-CM   1. Healthcare maintenance  Z00.00       Health Maintenance Exam: The patient's preventative maintenance and recommended screening tests for an annual wellness exam were  reviewed in full today. Brought up to date unless services declined.  Counselled on the importance of diet, exercise, and its role in overall health and mortality. The patient's FH and SH was reviewed, including their home life, tobacco status, and drug and alcohol status.  Follow-up in 1 year for physical exam or additional follow-up below.  Disposition: No follow-ups on file.  Future Appointments  Date Time Provider Department Center  02/20/2023 11:40 AM Nieshia Larmon, [name], MD [facility]    No orders of the defined types were placed in this encounter.  There are no discontinued medications. No orders of the defined types were placed in this encounter.   Signed,  Elpidio Galea. Laveta Gilkey, MD   Allergies as of 02/20/2023       Reactions   Cimzia [certolizumab Pegol] Anaphylaxis   Amoxicillin    Muscle pain   Naproxen Sodium Rash   Heavy doses        Medication List        Accurate as of Feb 19, 2023  5:18 PM. If you  have any questions, ask your nurse or doctor.          diazepam 5 MG tablet Commonly known as: VALIUM TAKE 1 TABLET(5 MG) BY MOUTH EVERY 8 HOURS AS NEEDED FOR ANXIETY   EPINEPHrine 0.3 mg/0.3 mL Soaj injection Commonly known as: EPI-PEN Inject 0.3 mg into the muscle as needed for anaphylaxis.   hydroxychloroquine 200 MG tablet Commonly known as: PLAQUENIL Take 200 mg by mouth daily.   levonorgestrel 20 MCG/DAY Iud Commonly known as: MIRENA 1 each by Intrauterine route once.   omeprazole 40 MG capsule Commonly known as: PRILOSEC TAKE 1 CAPSULE(40 MG) BY MOUTH DAILY   predniSONE 5 MG tablet Commonly known as: DELTASONE Take 10 mg by mouth daily with breakfast.

## 2023-02-20 ENCOUNTER — Encounter: Payer: Self-pay | Admitting: Oncology

## 2023-02-20 ENCOUNTER — Ambulatory Visit (INDEPENDENT_AMBULATORY_CARE_PROVIDER_SITE_OTHER): Payer: 59 | Admitting: Family Medicine

## 2023-02-20 ENCOUNTER — Encounter: Payer: Self-pay | Admitting: Family Medicine

## 2023-02-20 VITALS — BP 118/76 | HR 90 | Temp 97.7°F | Ht 64.75 in | Wt 192.0 lb

## 2023-02-20 DIAGNOSIS — Z0000 Encounter for general adult medical examination without abnormal findings: Secondary | ICD-10-CM

## 2023-02-20 DIAGNOSIS — Z13220 Encounter for screening for lipoid disorders: Secondary | ICD-10-CM

## 2023-02-20 MED ORDER — SERTRALINE HCL 50 MG PO TABS: 50.0000 mg | ORAL_TABLET | Freq: Every day | ORAL | 3 refills | Status: DC

## 2023-02-21 ENCOUNTER — Encounter: Payer: Self-pay | Admitting: Family Medicine

## 2023-03-27 ENCOUNTER — Encounter: Payer: Self-pay | Admitting: Oncology

## 2023-04-03 ENCOUNTER — Other Ambulatory Visit: Payer: Self-pay | Admitting: Rheumatology

## 2023-04-03 DIAGNOSIS — M0609 Rheumatoid arthritis without rheumatoid factor, multiple sites: Secondary | ICD-10-CM

## 2023-04-03 DIAGNOSIS — M1990 Unspecified osteoarthritis, unspecified site: Secondary | ICD-10-CM

## 2023-05-04 ENCOUNTER — Other Ambulatory Visit: Payer: Self-pay | Admitting: Family Medicine

## 2023-06-20 ENCOUNTER — Ambulatory Visit
Admission: RE | Admit: 2023-06-20 | Discharge: 2023-06-20 | Disposition: A | Discharge status: Home / Self Care | Payer: 59 | Source: Ambulatory Visit | Attending: Rheumatology | Admitting: Rheumatology

## 2023-06-20 DIAGNOSIS — M1380 Other specified arthritis, unspecified site: Secondary | ICD-10-CM

## 2023-06-20 DIAGNOSIS — M1990 Unspecified osteoarthritis, unspecified site: Secondary | ICD-10-CM

## 2023-06-20 DIAGNOSIS — M0609 Rheumatoid arthritis without rheumatoid factor, multiple sites: Secondary | ICD-10-CM

## 2023-07-03 ENCOUNTER — Other Ambulatory Visit: Payer: Self-pay | Admitting: Obstetrics and Gynecology

## 2023-07-03 DIAGNOSIS — N6320 Unspecified lump in the left breast, unspecified quadrant: Secondary | ICD-10-CM

## 2023-07-16 ENCOUNTER — Ambulatory Visit
Admission: RE | Admit: 2023-07-16 | Discharge: 2023-07-16 | Disposition: A | Discharge status: Home / Self Care | Payer: 59 | Source: Ambulatory Visit | Attending: Obstetrics and Gynecology | Admitting: Obstetrics and Gynecology

## 2023-07-16 ENCOUNTER — Other Ambulatory Visit: Payer: Self-pay | Admitting: Obstetrics and Gynecology

## 2023-07-16 DIAGNOSIS — N6320 Unspecified lump in the left breast, unspecified quadrant: Secondary | ICD-10-CM

## 2023-07-19 ENCOUNTER — Other Ambulatory Visit: Payer: Self-pay | Admitting: Family Medicine

## 2023-07-22 ENCOUNTER — Other Ambulatory Visit: Payer: Self-pay | Admitting: Family Medicine

## 2023-07-22 NOTE — Telephone Encounter (Signed)
Last office visit 02/20/2023 for CPE.  Last refilled 02/07/2023 for #30 with 3 refills.  Next Appt: No future appointments.

## 2023-11-11 DIAGNOSIS — M0609 Rheumatoid arthritis without rheumatoid factor, multiple sites: Secondary | ICD-10-CM | POA: Insufficient documentation

## 2023-12-24 ENCOUNTER — Encounter: Payer: Self-pay | Admitting: Obstetrics and Gynecology

## 2023-12-24 ENCOUNTER — Other Ambulatory Visit (HOSPITAL_COMMUNITY): Payer: Self-pay | Admitting: Obstetrics and Gynecology

## 2023-12-24 DIAGNOSIS — Z8249 Family history of ischemic heart disease and other diseases of the circulatory system: Secondary | ICD-10-CM

## 2024-01-02 ENCOUNTER — Other Ambulatory Visit: Payer: Self-pay | Admitting: Family Medicine

## 2024-01-03 MED ORDER — DIAZEPAM 5 MG PO TABS: ORAL_TABLET | ORAL | 3 refills | Status: DC

## 2024-01-03 NOTE — Telephone Encounter (Signed)
 LVM for patient to c/b and schedule.

## 2024-01-03 NOTE — Telephone Encounter (Signed)
 Spoke to pt, cancelled ov on 4/28, scheduled cpe for 02/26/24

## 2024-01-03 NOTE — Telephone Encounter (Signed)
 Please schedule CPE with fasting labs prior for Dr. Patsy Lager after 02/20/24.

## 2024-01-03 NOTE — Telephone Encounter (Signed)
 Copied from CRM [id number]. Topic: Clinical - Medication Question >> Jan 03, 2024 11:51 AM Kathryne Eriksson wrote: Reason for CRM: sertraline (ZOLOFT) 50 MG tablet & diazepam (VALIUM) 5 MG tablet

## 2024-01-03 NOTE — Telephone Encounter (Signed)
 Patient scheduled.

## 2024-01-03 NOTE — Telephone Encounter (Signed)
 Last office visit 02/20/2023 for CPE.  Last refilled 07/22/2023 for #30 with 3 refills.  Next Appt:   Please schedule CPE with fasting labs prior for Dr. Patsy Lager after 02/20/24.     Patient was scheduled wrong.  Please reschedule her as asked above and cancel Aprils appointment.

## 2024-01-16 ENCOUNTER — Ambulatory Visit
Admission: RE | Admit: 2024-01-16 | Discharge: 2024-01-16 | Disposition: A | Discharge status: Home / Self Care | Source: Ambulatory Visit | Attending: Obstetrics and Gynecology | Admitting: Obstetrics and Gynecology

## 2024-01-16 DIAGNOSIS — N6320 Unspecified lump in the left breast, unspecified quadrant: Secondary | ICD-10-CM

## 2024-01-27 ENCOUNTER — Ambulatory Visit: Admitting: Family Medicine

## 2024-02-05 ENCOUNTER — Telehealth: Payer: Self-pay | Admitting: *Deleted

## 2024-02-05 DIAGNOSIS — Z148 Genetic carrier of other disease: Secondary | ICD-10-CM

## 2024-02-05 DIAGNOSIS — Z1159 Encounter for screening for other viral diseases: Secondary | ICD-10-CM

## 2024-02-05 DIAGNOSIS — Z13220 Encounter for screening for lipoid disorders: Secondary | ICD-10-CM

## 2024-02-05 DIAGNOSIS — Z131 Encounter for screening for diabetes mellitus: Secondary | ICD-10-CM

## 2024-02-05 NOTE — Telephone Encounter (Signed)
-----   Message from Gerry Krone sent at 02/05/2024  3:05 PM EDT ----- Regarding: Lab orders for Wed, 5.21.25 Patient is scheduled for CPX labs, please order future labs, Thanks , Anselmo Kings

## 2024-02-18 ENCOUNTER — Telehealth: Payer: Self-pay | Admitting: *Deleted

## 2024-02-18 NOTE — Telephone Encounter (Signed)
 Copied from CRM [id number]. Topic: Clinical - Request for Lab/Test Order >> Feb 18, 2024  4:30 PM [name] wrote: Reason for CRM: Patient called in stated her rheumatologist called and stated for her labs she would like to be tested on her liver function as well.

## 2024-02-18 NOTE — Telephone Encounter (Signed)
 Noted.  Hepatic test is already ordered.

## 2024-02-19 ENCOUNTER — Other Ambulatory Visit (INDEPENDENT_AMBULATORY_CARE_PROVIDER_SITE_OTHER)

## 2024-02-19 DIAGNOSIS — Z131 Encounter for screening for diabetes mellitus: Secondary | ICD-10-CM

## 2024-02-19 DIAGNOSIS — Z148 Genetic carrier of other disease: Secondary | ICD-10-CM

## 2024-02-19 DIAGNOSIS — Z13220 Encounter for screening for lipoid disorders: Secondary | ICD-10-CM | POA: Diagnosis not present

## 2024-02-19 DIAGNOSIS — Z1159 Encounter for screening for other viral diseases: Secondary | ICD-10-CM

## 2024-02-19 LAB — LIPID PANEL
Cholesterol: 219 mg/dL — ABNORMAL HIGH (ref 0–200)
HDL: 59.9 mg/dL (ref >39.00)
LDL Cholesterol: 125 mg/dL — ABNORMAL HIGH (ref 0–99)
NonHDL: 159.12
Total CHOL/HDL Ratio: 4
Triglycerides: 169 mg/dL — ABNORMAL HIGH (ref 0.0–149.0)
VLDL: 33.8 mg/dL (ref 0.0–40.0)

## 2024-02-19 LAB — HEPATIC FUNCTION PANEL
ALT: 27 U/L (ref 0–35)
AST: 30 U/L (ref 0–37)
Albumin: 4.5 g/dL (ref 3.5–5.2)
Alkaline Phosphatase: 75 U/L (ref 39–117)
Bilirubin, Direct: 0.1 mg/dL (ref 0.0–0.3)
Total Bilirubin: 0.6 mg/dL (ref 0.2–1.2)
Total Protein: 7.3 g/dL (ref 6.0–8.3)

## 2024-02-19 LAB — CBC WITH DIFFERENTIAL/PLATELET
Basophils Absolute: 0 10*3/uL (ref 0.0–0.1)
Basophils Relative: 0.7 % (ref 0.0–3.0)
Eosinophils Absolute: 0.1 10*3/uL (ref 0.0–0.7)
Eosinophils Relative: 1.2 % (ref 0.0–5.0)
HCT: 42.7 % (ref 36.0–46.0)
Hemoglobin: 14.7 g/dL (ref 12.0–15.0)
Lymphocytes Relative: 18.3 % (ref 12.0–46.0)
Lymphs Abs: 1 10*3/uL (ref 0.7–4.0)
MCHC: 34.4 g/dL (ref 30.0–36.0)
MCV: 101 fl — ABNORMAL HIGH (ref 78.0–100.0)
Monocytes Absolute: 0.4 10*3/uL (ref 0.1–1.0)
Monocytes Relative: 7.5 % (ref 3.0–12.0)
Neutro Abs: 4.1 10*3/uL (ref 1.4–7.7)
Neutrophils Relative %: 72.3 % (ref 43.0–77.0)
Platelets: 188 10*3/uL (ref 150.0–400.0)
RBC: 4.22 Mil/uL (ref 3.87–5.11)
RDW: 12.4 % (ref 11.5–15.5)
WBC: 5.7 10*3/uL (ref 4.0–10.5)

## 2024-02-19 LAB — BASIC METABOLIC PANEL WITH GFR
BUN: 9 mg/dL (ref 6–23)
CO2: 29 meq/L (ref 19–32)
Calcium: 9.6 mg/dL (ref 8.4–10.5)
Chloride: 102 meq/L (ref 96–112)
Creatinine, Ser: 0.56 mg/dL (ref 0.40–1.20)
GFR: 110.86 mL/min (ref >60.00)
Glucose, Bld: 106 mg/dL — ABNORMAL HIGH (ref 70–99)
Potassium: 4.1 meq/L (ref 3.5–5.1)
Sodium: 140 meq/L (ref 135–145)

## 2024-02-19 LAB — HEMOGLOBIN A1C: Hgb A1c MFr Bld: 4.8 % (ref 4.6–6.5)

## 2024-02-20 LAB — HEPATITIS C ANTIBODY: Hepatitis C Ab: NONREACTIVE

## 2024-02-25 ENCOUNTER — Encounter: Payer: Self-pay | Admitting: Family Medicine

## 2024-02-25 NOTE — Progress Notes (Unsigned)
 Curtis Cain T. Adriel Desrosier, MD, CAQ Sports Medicine [facility] at [facility] [address] [place], [address]  Phone: [phone]  FAX: [phone]  [name] - 42 y.o. female  MRN [id number]  Date of Birth: 1981/02/26  Date: 02/26/2024  PCP: Scherrie Curt, MD  Referral: Scherrie Curt, MD  No chief complaint on file.  Patient Care Team: Scherrie Curt, MD as PCP - General (Family Medicine) Shellie Dials, MD as Consulting Physician (Oncology) Subjective:   Phyllis Myers is a 43 y.o. pleasant patient who presents with the following:  Health Maintenance Summary Reviewed and updated, unless pt declines services.  Tobacco History Reviewed. Non-smoker Alcohol: No concerns, no excessive use Exercise Habits: Some activity, rec at least 30 mins 5 times a week STD concerns: none Drug Use: None Lumps or breast concerns: no  Phyllis Myers is a very well-known patient, who I have known for many years.  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    Priority: High   Anaphylactic shock 12/07/2021    Priority: Medium    Panic disorder 07/21/2014    Priority: Medium    Generalized anxiety disorder 09/08/2013    Priority: Medium    Common migraine 01/27/2013    Priority: Medium    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 Ivette Marks). Now off enbrel, methotrexate, uses indomethacin and hydrocodone  rarely for pain.    Rheumatoid arthritis (HCC)     Past Surgical History:  Procedure Laterality Date   ABDOMINAL EXPOSURE N/A 09/06/2021   Procedure: ABDOMINAL EXPOSURE;  Surgeon: Celso College, MD;  Location: [facility] OR[facility];  Service: Vascular;  Laterality: N/A;   ANTERIOR LATERAL LUMBAR FUSION WITH PERCUTANEOUS SCREW 1 LEVEL N/A 09/06/2021   Procedure: L5-S1 ANTERIOR LATERAL LUMBAR FUSION WITH PERCUTANEOUS SCREW;  Surgeon: Jodeen Munch, MD;  Location: [facility];  Service: Neurosurgery;  Laterality: N/A;   PERONEAL NERVE DECOMPRESSION Left 12/14/2019   Procedure: PERONEAL NERVE DECOMPRESSION;  Surgeon: Berta Brittle, MD;  Location: [facility];  Service: Neurosurgery;  Laterality: Left;   WISDOM TOOTH EXTRACTION      Family History  Problem Relation Age of Onset   Pulmonary Hypertension Mother    Cardiomyopathy Mother    Heart disease Mother    Heart disease Father 109       MI   Heart disease Maternal  Grandfather    Heart disease Paternal Grandfather     Social History   Social History Narrative   no kids.    Past Medical History, Surgical History, Social History, Family History, Problem List, Medications, and Allergies have been reviewed and updated if relevant.  Review of Systems: Pertinent positives are listed above.  Otherwise, a full 14 point review of systems has been done in full and it is  negative except where it is noted positive.  Objective:   There were no vitals taken for this visit. Ideal Body Weight:   No results found.    02/17/2018    2:27 PM  Depression screen PHQ 2/9  Decreased Interest 0  Down, Depressed, Hopeless 0  PHQ - 2 Score 0     GEN: well developed, well nourished, no acute distress Eyes: conjunctiva and lids normal, PERRLA, EOMI ENT: TM clear, nares clear, oral exam WNL Neck: supple, no lymphadenopathy, no thyromegaly, no JVD Pulm: clear to auscultation and percussion, respiratory effort normal CV: regular rate and rhythm, S1-S2, no murmur, rub or gallop, no bruits Chest: no scars, masses, no lumps BREAST: breast exam declined GI: soft, non-tender; no hepatosplenomegaly, masses; active bowel sounds all quadrants GU: GU exam declined Lymph: no cervical, axillary or inguinal adenopathy MSK: gait normal, muscle tone and strength WNL, no joint swelling, effusions, discoloration, crepitus  SKIN: clear, good turgor, color WNL, no rashes, lesions, or ulcerations Neuro: normal mental status, normal strength, sensation, and motion Psych: alert; oriented to person, place and time, normally interactive and not anxious or depressed in appearance.   All labs reviewed with patient. Results for orders placed or performed in visit on 02/19/24  Hepatic Function Panel   Collection Time: 02/19/24  9:05 AM  Result Value Ref Range   Total Bilirubin 0.6 0.2 - 1.2 mg/dL   Bilirubin, Direct 0.1 0.0 - 0.3 mg/dL   Alkaline Phosphatase 75 39 - 117 U/L   AST 30 0 - 37 U/L   ALT 27 0 - 35 U/L   Total Protein 7.3 6.0 - 8.3 g/dL   Albumin  4.5 3.5 - 5.2 g/dL  Basic metabolic panel with GFR   Collection Time: 02/19/24  9:05 AM  Result Value Ref Range   Sodium 140 135 - 145 mEq/L   Potassium 4.1 3.5 - 5.1 mEq/L   Chloride 102 96 - 112 mEq/L   CO2 29 19 - 32 mEq/L   Glucose, Bld 106 (H) 70 - 99 mg/dL   BUN 9 6 - 23 mg/dL   Creatinine, Ser 3.66 0.40 - 1.20 mg/dL    GFR 440.34 >74.25 mL/min   Calcium 9.6 8.4 - 10.5 mg/dL  Hepatitis C antibody   Collection Time: 02/19/24  9:05 AM  Result Value Ref Range   Hepatitis C Ab NON-REACTIVE NON-REACTIVE  CBC with Differential/Platelet   Collection Time: 02/19/24  9:05 AM  Result Value Ref Range   WBC 5.7 4.0 - 10.5 K/uL   RBC 4.22 3.87 - 5.11 Mil/uL   Hemoglobin 14.7 12.0 - 15.0 g/dL   HCT 95.6 38.7 - 56.4 %   MCV 101.0 (H) 78.0 - 100.0 fl   MCHC 34.4 30.0 - 36.0 g/dL   RDW 33.2 95.1 - 88.4 %   Platelets 188.0 150.0 - 400.0 K/uL   Neutrophils Relative % 72.3 43.0 - 77.0 %   Lymphocytes Relative 18.3 12.0 - 46.0 %   Monocytes Relative 7.5 3.0 - 12.0 %   Eosinophils Relative 1.2 0.0 - 5.0 %  Basophils Relative 0.7 0.0 - 3.0 %   Neutro Abs 4.1 1.4 - 7.7 K/uL   Lymphs Abs 1.0 0.7 - 4.0 K/uL   Monocytes Absolute 0.4 0.1 - 1.0 K/uL   Eosinophils Absolute 0.1 0.0 - 0.7 K/uL   Basophils Absolute 0.0 0.0 - 0.1 K/uL  Hemoglobin A1c   Collection Time: 02/19/24  9:05 AM  Result Value Ref Range   Hgb A1c MFr Bld 4.8 4.6 - 6.5 %  Lipid panel   Collection Time: 02/19/24  9:05 AM  Result Value Ref Range   Cholesterol 219 (H) 0 - 200 mg/dL   Triglycerides 086.5 (H) 0.0 - 149.0 mg/dL   HDL 78.46 >96.29 mg/dL   VLDL 52.8 0.0 - 41.3 mg/dL   LDL Cholesterol 244 (H) 0 - 99 mg/dL   Total CHOL/HDL Ratio 4    NonHDL 159.12    No results found.  Assessment and Plan:     ICD-10-CM   1. Healthcare maintenance  Z00.00       Health Maintenance Exam: The patient's preventative maintenance and recommended screening tests for an annual wellness exam were reviewed in full today. Brought up to date unless services declined.  Counselled on the importance of diet, exercise, and its role in overall health and mortality. The patient's FH and SH was reviewed, including their home life, tobacco status, and drug and alcohol status.  Follow-up in 1 year for physical exam or additional follow-up below.  Disposition:  No follow-ups on file.  Future Appointments  Date Time Provider Department Center  02/26/2024 12:00 PM Ayeza Therriault, Jolena Nay, MD [facility]    No orders of the defined types were placed in this encounter.  There are no discontinued medications. No orders of the defined types were placed in this encounter.   Signed,  Ranny Bye. Yen Wandell, MD   Allergies as of 02/26/2024       Reactions   Cimzia [certolizumab Pegol] Anaphylaxis   Amoxicillin     Muscle pain   Naproxen Sodium Rash   Heavy doses        Medication List        Accurate as of Feb 25, 2024 11:16 AM. If you have any questions, ask your nurse or doctor.          diazepam  5 MG tablet Commonly known as: VALIUM  TAKE 1 TABLET(5 MG) BY MOUTH EVERY 8 HOURS AS NEEDED FOR ANXIETY   EPINEPHrine  0.3 mg/0.3 mL Soaj injection Commonly known as: EPI-PEN Inject 0.3 mg into the muscle as needed for anaphylaxis.   hydroxychloroquine 200 MG tablet Commonly known as: PLAQUENIL Take 200 mg by mouth daily.   levonorgestrel 20 MCG/DAY Iud Commonly known as: MIRENA 1 each by Intrauterine route once.   omeprazole  40 MG capsule Commonly known as: PRILOSEC TAKE 1 CAPSULE(40 MG) BY MOUTH DAILY   predniSONE  1 MG tablet Commonly known as: DELTASONE  Take 1 mg by mouth daily with breakfast.   sertraline  50 MG tablet Commonly known as: ZOLOFT  TAKE 1 TABLET(50 MG) BY MOUTH DAILY

## 2024-02-26 ENCOUNTER — Encounter: Payer: Self-pay | Admitting: Family Medicine

## 2024-02-26 ENCOUNTER — Ambulatory Visit (INDEPENDENT_AMBULATORY_CARE_PROVIDER_SITE_OTHER): Admitting: Family Medicine

## 2024-02-26 VITALS — BP 108/84 | HR 76 | Temp 97.3°F | Ht 64.75 in | Wt 194.0 lb

## 2024-02-26 DIAGNOSIS — Z0000 Encounter for general adult medical examination without abnormal findings: Secondary | ICD-10-CM | POA: Diagnosis not present

## 2024-02-26 MED ORDER — OMEPRAZOLE 40 MG PO CPDR: DELAYED_RELEASE_CAPSULE | ORAL | 3 refills | Status: AC

## 2024-02-26 MED ORDER — SERTRALINE HCL 25 MG PO TABS: 25.0000 mg | ORAL_TABLET | Freq: Every day | ORAL | 3 refills | Status: AC

## 2024-02-26 MED ORDER — DIAZEPAM 5 MG PO TABS: ORAL_TABLET | ORAL | 3 refills | Status: DC

## 2024-02-26 MED ORDER — EPINEPHRINE 0.3 MG/0.3ML IJ SOAJ: 0.3000 mg | INTRAMUSCULAR | 0 refills | Status: DC | PRN

## 2024-02-26 MED ORDER — EPINEPHRINE 0.3 MG/0.3ML IJ SOAJ: 0.3000 mg | INTRAMUSCULAR | 3 refills | Status: AC | PRN

## 2024-06-11 ENCOUNTER — Other Ambulatory Visit: Payer: Self-pay | Admitting: Family Medicine

## 2024-07-06 ENCOUNTER — Other Ambulatory Visit: Payer: Self-pay | Admitting: Obstetrics and Gynecology

## 2024-07-06 DIAGNOSIS — Z8249 Family history of ischemic heart disease and other diseases of the circulatory system: Secondary | ICD-10-CM

## 2024-07-23 ENCOUNTER — Ambulatory Visit

## 2024-08-17 ENCOUNTER — Ambulatory Visit
Admission: RE | Admit: 2024-08-17 | Discharge: 2024-08-17 | Disposition: A | Discharge status: Home / Self Care | Source: Ambulatory Visit | Attending: Obstetrics and Gynecology | Admitting: Obstetrics and Gynecology

## 2024-08-17 DIAGNOSIS — Z8249 Family history of ischemic heart disease and other diseases of the circulatory system: Secondary | ICD-10-CM | POA: Insufficient documentation

## 2024-08-30 NOTE — Progress Notes (Unsigned)
 Cardiology Office Note  Date:  08/31/2024   ID:  Phyllis Myers, DOB April 12, 1981, MRN [id number]  PCP:  Phyllis Mirza, MD   Chief Complaint  Patient presents with   New Patient (Initial Visit)    Referred by Dr. McComb for family history of premature CAD & discuss CT cardiac calcium score.      HPI:  Phyllis Myers is a 43 y.o. 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 Maryl). Now off enbrel, methotrexate, uses indomethacin and hydrocodone  rarely for pain.    Rheumatoid arthritis (HCC)   Hyperlipidemia Who presents by referral from Dr. John Mccomb for consultation of her family history ischemic heart disease, abnormal calcium scoring  Reports significant family history, Through advised from primary care had screening calcium score CT calcium score August 17, 2024 Calcium score 5  No sx, denies chest pain or shortness of breath, active at baseline Has RA, diffuse joint issues  Nonsmoker No diabetes Elevated cholesterol over 200  Lab work reviewed Total cholesterol 219 LDL 125  Mother with pulm htn Grandfather with heart Fathers side numerous family members with MI  EKG personally reviewed by myself on todays visit EKG Interpretation Date/Time:  Monday August 31 2024 15:26:16 EST Ventricular Rate:  71 PR Interval:  156 QRS Duration:  78 QT Interval:  390 QTC Calculation: 423 R Axis:   42  Text Interpretation: Normal sinus rhythm When compared with ECG of 08-Dec-2021 05:39, No significant change was found Confirmed by Perla Lye [phone]) on 08/31/2024 3:51:04 PM    PMH:   has a past medical history of Depression, major, single  episode, mild (12/20/2016), Generalized anxiety disorder (09/08/2013), GERD (gastroesophageal reflux disease), Hemochromatosis, Hypertension, Neuropathy, Panic disorder (07/21/2014), 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   PSH:    Past Surgical History:  Procedure Laterality Date   ABDOMINAL EXPOSURE N/A 09/06/2021   Procedure: ABDOMINAL EXPOSURE;  Surgeon: Marea Selinda RAMAN, MD;  Location: [facility];  Service: Vascular;  Laterality: N/A;   ANTERIOR LATERAL LUMBAR FUSION WITH PERCUTANEOUS SCREW 1 LEVEL N/A 09/06/2021   Procedure: L5-S1 ANTERIOR LATERAL LUMBAR FUSION WITH PERCUTANEOUS SCREW;  Surgeon: Clois Fret, MD;  Location: [facility];  Service: Neurosurgery;  Laterality: N/A;   PERONEAL NERVE DECOMPRESSION Left 12/14/2019   Procedure: PERONEAL NERVE DECOMPRESSION;  Surgeon: Bluford Standing, MD;  Location: [facility];  Service: Neurosurgery;  Laterality: Left;   WISDOM TOOTH EXTRACTION      Current Outpatient Medications  Medication Sig Dispense Refill   Alpha Lipoic Acid 200 MG CAPS Take by mouth daily.     cetirizine (ZYRTEC ALLERGY) 10 MG tablet Take 10 mg by mouth daily.     cholecalciferol (VITAMIN D3) 25 MCG (1000 UNIT) tablet Take 1,000 Units by mouth daily.     diazepam  (VALIUM ) 5 MG tablet TAKE 1 TABLET(5 MG) BY MOUTH EVERY 8 HOURS AS NEEDED FOR ANXIETY 30 tablet 3   EPINEPHrine  0.3 mg/0.3 mL IJ SOAJ injection Inject 0.3 mg into the muscle as needed for anaphylaxis. 1 each 3   furosemide (LASIX) 20 MG tablet Take 20 mg by mouth daily as needed for edema.     hydroxychloroquine (PLAQUENIL)  200 MG tablet Take 200 mg by mouth daily.     levonorgestrel (MIRENA) 20 MCG/DAY IUD 1 each by Intrauterine route once.     omeprazole  (PRILOSEC) 40 MG capsule TAKE 1 CAPSULE(40 MG) BY MOUTH DAILY 90 capsule 3   ORENCIA CLICKJECT 125 MG/ML SOAJ 125 mg once a week.     sertraline  (ZOLOFT ) 25 MG tablet Take 1 tablet (25 mg total) by mouth daily. 90 tablet 3   No  current facility-administered medications for this visit.    Allergies:   Certolizumab pegol, Amoxicillin , Sulfa antibiotics, and Naproxen sodium   Social History:  The patient  reports that she has never smoked. She has never used smokeless tobacco. She reports current alcohol use of about 4.0 - 6.0 standard drinks of alcohol per week. She reports that she does not use drugs.   Family History:   family history includes Cardiomyopathy in her mother; Heart disease in her maternal grandfather and paternal grandfather; Heart disease (age of onset: 77) in her father; Heart disease (age of onset: 68) in her mother; Pulmonary Hypertension in her mother.    Review of Systems: Review of Systems  Constitutional: Negative.   HENT: Negative.    Respiratory: Negative.    Cardiovascular: Negative.   Gastrointestinal: Negative.   Musculoskeletal: Negative.   Neurological: Negative.   Psychiatric/Behavioral: Negative.    All other systems reviewed and are negative.   PHYSICAL EXAM: VS:  BP (!) 130/90 (BP Location: Right Arm, Patient Position: Sitting, Cuff Size: Normal)   Pulse 71   Ht 5' 4 (1.626 m)   Wt 202 lb 4 oz (91.7 kg)   SpO2 97%   BMI 34.72 kg/m  , BMI Body mass index is 34.72 kg/m. GEN: Well nourished, well developed, in no acute distress HEENT: normal Neck: no JVD, carotid bruits, or masses Cardiac: RRR; no murmurs, rubs, or gallops,no edema  Respiratory:  clear to auscultation bilaterally, normal work of breathing GI: soft, nontender, nondistended, + BS MS: no deformity or atrophy Skin: warm and dry, no rash Neuro:  Strength and sensation are intact Psych: euthymic mood, full affect  Recent Labs: 02/19/2024: ALT 27; BUN 9; Creatinine, Ser 0.56; Hemoglobin 14.7; Platelets 188.0; Potassium 4.1; Sodium 140    Lipid Panel Lab Results  Component Value Date   CHOL 219 (H) 02/19/2024   HDL 59.90 02/19/2024   LDLCALC 125 (H) 02/19/2024   TRIG 169.0 (H) 02/19/2024      Wt  Readings from Last 3 Encounters:  08/31/24 202 lb 4 oz (91.7 kg)  02/26/24 194 lb (88 kg)  02/20/23 192 lb (87.1 kg)       ASSESSMENT AND PLAN:  Problem List Items Addressed This Visit       Cardiology Problems   HTN (hypertension) (Chronic)   Relevant Orders   EKG 12-Lead (Completed)     Other   Generalized anxiety disorder (Chronic)   Other Visit Diagnoses       Coronary artery calcification    -  Primary   Relevant Orders   EKG 12-Lead (Completed)     Mixed hyperlipidemia          Coronary calcification Although score of 5 is low, 95th percentile given she is young age with start of coronary calcification, strong family history, hyperlipidemia We have recommended she start Crestor 5 mg daily with slow titration upwards in effort to get LDL less than 70, preferably close to 55 - Lifestyle modification recommended - May need to add  Zetia with Crestor to achieve goal numbers - No further cardiac testing needed given calcium score very low  Essential hypertension Currently not on medications Recommend close monitoring of blood pressure at home Numbers should improve with lifestyle modification, weight loss  Rheumatoid arthritis Reports symptoms managed with Plaquenil and Orencia  Hyperlipidemia Starting on Crestor 5 daily, prefers not to be on shots if possible  Seen in consultation for Dr. watt and will be referred back to his office for ongoing care of the issues detailed above  Signed, Velinda Lunger, M.D., Ph.D. [facility] [place], [place] [phone]

## 2024-08-31 ENCOUNTER — Other Ambulatory Visit: Payer: Self-pay | Admitting: Family Medicine

## 2024-08-31 ENCOUNTER — Encounter: Payer: Self-pay | Admitting: Cardiovascular Disease

## 2024-08-31 ENCOUNTER — Ambulatory Visit: Attending: Cardiovascular Disease | Admitting: Cardiovascular Disease

## 2024-08-31 VITALS — BP 130/90 | HR 71 | Ht 64.0 in | Wt 202.2 lb

## 2024-08-31 DIAGNOSIS — F411 Generalized anxiety disorder: Secondary | ICD-10-CM | POA: Diagnosis not present

## 2024-08-31 DIAGNOSIS — I10 Essential (primary) hypertension: Secondary | ICD-10-CM

## 2024-08-31 DIAGNOSIS — I2510 Atherosclerotic heart disease of native coronary artery without angina pectoris: Secondary | ICD-10-CM

## 2024-08-31 DIAGNOSIS — E782 Mixed hyperlipidemia: Secondary | ICD-10-CM | POA: Diagnosis not present

## 2024-08-31 MED ORDER — ROSUVASTATIN CALCIUM 5 MG PO TABS: 5.0000 mg | ORAL_TABLET | Freq: Every day | ORAL | 3 refills | Status: AC

## 2024-08-31 NOTE — Patient Instructions (Addendum)
 Medication Instructions:  Please start crestor  5 mg daily  If you need a refill on your cardiac medications before your next appointment, please call your pharmacy.   Lab work: Your provider would like for you to return in three months to have the following labs drawn: Lipids.   Please go to [facility] [address] [address], [place] [address] You do not need an appointment.  They are open from 8 am- 4:30 pm.  Lunch from 1:00 pm- 2:00 pm You do need to be fasting.     Testing/Procedures: Home blood pressures.  Follow-Up: At [facility], you and your health needs are our priority.  As part of our continuing mission to provide you with exceptional heart care, we have created designated Provider Care Teams.  These Care Teams include your primary Cardiologist (physician) and Advanced Practice Providers (APPs -  Physician Assistants and Nurse Practitioners) who all work together to provide you with the care you need, when you need it.  You will need a follow up appointment in 12 months  Providers on your designated Care Team:   Lonni Meager, NP Bernardino Bring, PA-C Cadence [place], [place]  COVID-19 Vaccine Information can be found at: [email or web address] For questions related to vaccine distribution or appointments, please email [email or web address] or call [phone].

## 2024-09-01 NOTE — Telephone Encounter (Signed)
 Last office visit 02/26/2024 for CPE.  Last refilled 02/26/24 for #30 with 3 refills.  Next appt: No future appointments.

## 2024-10-07 ENCOUNTER — Ambulatory Visit: Admitting: Family Medicine

## 2024-11-05 ENCOUNTER — Telehealth: Payer: Self-pay | Admitting: Family Medicine

## 2024-11-05 NOTE — Telephone Encounter (Signed)
 Copied from CRM #[id number]. Topic: Complaint (DO NOT CONVERT) - Staff >> Oct 07, 2024  9:47 AM Montie POUR wrote: Date of Incident: 10/07/24 Details of complaint: Phyllis Myers was turned away this morning due to being 7-8 minutes late and she thought this was rude. How would the patient like to see it resolved? Phyllis Myers just wanted clinic to know.  On a scale of 1-10, how was your experience? Unknown What would it take to bring it to a 10? She thought she should have been seen.   Route to Research Officer, Political Party.   Addendum: 11/05/2024 Noted.  Will education officer, community.  This was not our office, so I do not think there is anything that we can do about it.  This was a non-[place] office Dentist).  Electronically Signed  By: Jacques Schroeder, MD On: 11/05/2024 1:55 PM

## 2024-11-06 ENCOUNTER — Ambulatory Visit: Payer: Self-pay

## 2024-11-06 NOTE — Telephone Encounter (Signed)
 FYI Only or Action Required?: FYI only for provider: appointment scheduled on 2/9.  Patient was last seen in primary care on 02/26/2024 by Phyllis Mirza, MD.  Called Nurse Triage reporting Abdominal Pain.  Symptoms began several weeks ago.  Interventions attempted: Rest, hydration, or home remedies.  Symptoms are: gradually worsening.  Triage Disposition: See PCP Within 2 Weeks  Patient/caregiver understands and will follow disposition?: Yes  Message from [facility] sent at 11/06/2024  1:04 PM EST  Reason for Triage: Nausea, upper abdomen pain, bloated and tight feeling, diahhrea   Reason for Disposition  Abdominal pain is a chronic symptom (recurrent or ongoing AND present > 4 weeks)  Answer Assessment - Initial Assessment Questions Pt with worsened Abdominal pain/spasm feeling over the last 3 weeks. Was seeing GI for this in the past- can't get in until April unless new referral-  Pains upper abdomen left side under the breast- right upper abdomen intermittently. Nausea with vomiting 2-3x week and diarrhea once a week.  Denies CP, SOB, Fever, sharp stabbing or shooting pain. Denies worsened heartburn sensation    Bloating and tightness- feels like she needs to have a BM- doesn't lessen with BM   Has always been contributed to weight gain- Eatting lighter and better   Appt with PCP 2/9- understands ED precautions   1. LOCATION: Where does it hurt?      Left upper abdomen but sometimes both sides  2. RADIATION: Does the pain shoot anywhere else? (e.g., chest, back)     Denies  3. ONSET: When did the pain begin? (e.g., minutes, hours or days ago)      2-3 weeks worsened  4. SUDDEN: Gradual or sudden onset?     Gradual  5. PATTERN Does the pain come and go, or is it constant?     Intermittent- randomly no correlation  6. SEVERITY: How bad is the pain?  (e.g., Scale 1-10; mild, moderate, or severe)     5/10 7. RECURRENT SYMPTOM: Have you ever had this type of  stomach pain before? If Yes, ask: When was the last time? and What happened that time?      Has seen gastro for in the past 8. CAUSE: What do you think is causing the stomach pain? (e.g., gallstones, recent abdominal surgery)     unsure 9. RELIEVING/AGGRAVATING FACTORS: What makes it better or worse? (e.g., antacids, bending or twisting motion, bowel movement)     Certain moving and twisting causes pain  10. OTHER SYMPTOMS: Do you have any other symptoms? (e.g., back pain, diarrhea, fever, urination pain, vomiting)       Nausea, vomiting and diarrhea intermittently  11. PREGNANCY: Is there any chance you are pregnant? When was your last menstrual period?       Denies  Protocols used: Abdominal Pain - Female-A-AH

## 2024-11-09 ENCOUNTER — Ambulatory Visit: Admitting: Family Medicine
